# Patient Record
Sex: Female | Born: 1975 | Race: Black or African American | Hispanic: No | Marital: Single
Health system: Southern US, Community
[De-identification: ages and names within clinical notes are randomized; demographics above are authoritative.]

## PROBLEM LIST (undated history)

## (undated) DIAGNOSIS — F419 Anxiety disorder, unspecified: Secondary | ICD-10-CM

## (undated) DIAGNOSIS — T7840XA Allergy, unspecified, initial encounter: Secondary | ICD-10-CM

## (undated) DIAGNOSIS — D649 Anemia, unspecified: Secondary | ICD-10-CM

## (undated) DIAGNOSIS — F209 Schizophrenia, unspecified: Secondary | ICD-10-CM

## (undated) DIAGNOSIS — N200 Calculus of kidney: Secondary | ICD-10-CM

## (undated) DIAGNOSIS — I729 Aneurysm of unspecified site: Secondary | ICD-10-CM

## (undated) DIAGNOSIS — M329 Systemic lupus erythematosus, unspecified: Secondary | ICD-10-CM

## (undated) DIAGNOSIS — M1990 Unspecified osteoarthritis, unspecified site: Secondary | ICD-10-CM

## (undated) HISTORY — PX: TUBAL LIGATION: SHX77

## (undated) HISTORY — DX: Aneurysm of unspecified site: I72.9

## (undated) HISTORY — DX: Anemia, unspecified: D64.9

## (undated) HISTORY — PX: HERNIA REPAIR: SHX51

## (undated) HISTORY — DX: Allergy, unspecified, initial encounter: T78.40XA

## (undated) HISTORY — DX: Anxiety disorder, unspecified: F41.9

## (undated) HISTORY — DX: Systemic lupus erythematosus, unspecified: M32.9

---

## 1997-11-30 ENCOUNTER — Inpatient Hospital Stay (HOSPITAL_COMMUNITY): Admission: RE | Admit: 1997-11-30 | Discharge: 1997-11-30 | Payer: Self-pay | Admitting: *Deleted

## 1998-01-01 ENCOUNTER — Emergency Department (HOSPITAL_COMMUNITY): Admission: EM | Admit: 1998-01-01 | Discharge: 1998-01-01 | Payer: Self-pay | Admitting: Emergency Medicine

## 1998-01-09 ENCOUNTER — Encounter (HOSPITAL_BASED_OUTPATIENT_CLINIC_OR_DEPARTMENT_OTHER): Payer: Self-pay | Admitting: General Surgery

## 1998-01-10 ENCOUNTER — Ambulatory Visit (HOSPITAL_COMMUNITY): Admission: RE | Admit: 1998-01-10 | Discharge: 1998-01-10 | Payer: Self-pay | Admitting: General Surgery

## 1998-10-14 ENCOUNTER — Inpatient Hospital Stay (HOSPITAL_COMMUNITY): Admission: AD | Admit: 1998-10-14 | Discharge: 1998-10-14 | Payer: Self-pay | Admitting: *Deleted

## 1999-05-24 ENCOUNTER — Inpatient Hospital Stay (HOSPITAL_COMMUNITY): Admission: AD | Admit: 1999-05-24 | Discharge: 1999-05-24 | Payer: Self-pay | Admitting: *Deleted

## 1999-07-23 ENCOUNTER — Encounter: Payer: Self-pay | Admitting: *Deleted

## 1999-07-23 ENCOUNTER — Ambulatory Visit (HOSPITAL_COMMUNITY): Admission: RE | Admit: 1999-07-23 | Discharge: 1999-07-23 | Payer: Self-pay | Admitting: *Deleted

## 1999-09-08 ENCOUNTER — Inpatient Hospital Stay (HOSPITAL_COMMUNITY): Admission: AD | Admit: 1999-09-08 | Discharge: 1999-09-08 | Payer: Self-pay | Admitting: *Deleted

## 1999-09-23 ENCOUNTER — Inpatient Hospital Stay (HOSPITAL_COMMUNITY): Admission: AD | Admit: 1999-09-23 | Discharge: 1999-09-23 | Payer: Self-pay | Admitting: *Deleted

## 1999-10-03 ENCOUNTER — Inpatient Hospital Stay (HOSPITAL_COMMUNITY): Admission: AD | Admit: 1999-10-03 | Discharge: 1999-10-03 | Payer: Self-pay | Admitting: *Deleted

## 1999-10-07 ENCOUNTER — Inpatient Hospital Stay (HOSPITAL_COMMUNITY): Admission: AD | Admit: 1999-10-07 | Discharge: 1999-10-07 | Payer: Self-pay | Admitting: *Deleted

## 1999-10-10 ENCOUNTER — Inpatient Hospital Stay (HOSPITAL_COMMUNITY): Admission: AD | Admit: 1999-10-10 | Discharge: 1999-10-12 | Payer: Self-pay | Admitting: *Deleted

## 1999-10-12 ENCOUNTER — Encounter: Payer: Self-pay | Admitting: *Deleted

## 1999-10-17 ENCOUNTER — Inpatient Hospital Stay (HOSPITAL_COMMUNITY): Admission: AD | Admit: 1999-10-17 | Discharge: 1999-10-17 | Payer: Self-pay | Admitting: *Deleted

## 1999-10-27 ENCOUNTER — Inpatient Hospital Stay (HOSPITAL_COMMUNITY): Admission: AD | Admit: 1999-10-27 | Discharge: 1999-10-27 | Payer: Self-pay | Admitting: *Deleted

## 1999-11-07 ENCOUNTER — Inpatient Hospital Stay (HOSPITAL_COMMUNITY): Admission: AD | Admit: 1999-11-07 | Discharge: 1999-11-07 | Payer: Self-pay | Admitting: *Deleted

## 1999-11-19 ENCOUNTER — Inpatient Hospital Stay (HOSPITAL_COMMUNITY): Admission: AD | Admit: 1999-11-19 | Discharge: 1999-11-22 | Payer: Self-pay | Admitting: *Deleted

## 1999-11-19 ENCOUNTER — Encounter (INDEPENDENT_AMBULATORY_CARE_PROVIDER_SITE_OTHER): Payer: Self-pay

## 2001-01-05 ENCOUNTER — Emergency Department (HOSPITAL_COMMUNITY): Admission: EM | Admit: 2001-01-05 | Discharge: 2001-01-05 | Payer: Self-pay | Admitting: Emergency Medicine

## 2001-01-05 ENCOUNTER — Encounter: Payer: Self-pay | Admitting: Emergency Medicine

## 2001-05-07 ENCOUNTER — Emergency Department (HOSPITAL_COMMUNITY): Admission: EM | Admit: 2001-05-07 | Discharge: 2001-05-07 | Payer: Self-pay

## 2001-09-05 ENCOUNTER — Encounter: Admission: RE | Admit: 2001-09-05 | Discharge: 2001-09-05 | Payer: Self-pay | Admitting: Nephrology

## 2001-09-05 ENCOUNTER — Encounter: Payer: Self-pay | Admitting: Nephrology

## 2001-09-07 ENCOUNTER — Encounter (HOSPITAL_BASED_OUTPATIENT_CLINIC_OR_DEPARTMENT_OTHER): Payer: Self-pay | Admitting: General Surgery

## 2001-09-09 ENCOUNTER — Encounter (INDEPENDENT_AMBULATORY_CARE_PROVIDER_SITE_OTHER): Payer: Self-pay

## 2001-09-09 ENCOUNTER — Ambulatory Visit (HOSPITAL_COMMUNITY): Admission: RE | Admit: 2001-09-09 | Discharge: 2001-09-09 | Payer: Self-pay | Admitting: General Surgery

## 2002-06-12 ENCOUNTER — Emergency Department (HOSPITAL_COMMUNITY): Admission: EM | Admit: 2002-06-12 | Discharge: 2002-06-12 | Payer: Self-pay | Admitting: Emergency Medicine

## 2002-06-20 ENCOUNTER — Encounter: Payer: Self-pay | Admitting: Nephrology

## 2002-06-20 ENCOUNTER — Encounter: Admission: RE | Admit: 2002-06-20 | Discharge: 2002-06-20 | Payer: Self-pay | Admitting: Nephrology

## 2002-09-14 ENCOUNTER — Inpatient Hospital Stay (HOSPITAL_COMMUNITY): Admission: AD | Admit: 2002-09-14 | Discharge: 2002-09-14 | Payer: Self-pay | Admitting: Obstetrics and Gynecology

## 2002-10-26 ENCOUNTER — Emergency Department (HOSPITAL_COMMUNITY): Admission: EM | Admit: 2002-10-26 | Discharge: 2002-10-26 | Payer: Self-pay | Admitting: Emergency Medicine

## 2003-10-08 ENCOUNTER — Inpatient Hospital Stay (HOSPITAL_COMMUNITY): Admission: AD | Admit: 2003-10-08 | Discharge: 2003-10-08 | Payer: Self-pay | Admitting: Obstetrics and Gynecology

## 2004-07-28 ENCOUNTER — Emergency Department (HOSPITAL_COMMUNITY): Admission: EM | Admit: 2004-07-28 | Discharge: 2004-07-28 | Payer: Self-pay | Admitting: Emergency Medicine

## 2005-04-05 ENCOUNTER — Inpatient Hospital Stay (HOSPITAL_COMMUNITY): Admission: AD | Admit: 2005-04-05 | Discharge: 2005-04-05 | Payer: Self-pay | Admitting: Obstetrics and Gynecology

## 2005-07-30 ENCOUNTER — Emergency Department (HOSPITAL_COMMUNITY): Admission: EM | Admit: 2005-07-30 | Discharge: 2005-07-30 | Payer: Self-pay | Admitting: Family Medicine

## 2005-08-16 ENCOUNTER — Emergency Department (HOSPITAL_COMMUNITY): Admission: EM | Admit: 2005-08-16 | Discharge: 2005-08-16 | Payer: Self-pay | Admitting: Emergency Medicine

## 2005-10-03 ENCOUNTER — Emergency Department (HOSPITAL_COMMUNITY): Admission: EM | Admit: 2005-10-03 | Discharge: 2005-10-03 | Payer: Self-pay | Admitting: Emergency Medicine

## 2005-10-26 ENCOUNTER — Inpatient Hospital Stay (HOSPITAL_COMMUNITY): Admission: RE | Admit: 2005-10-26 | Discharge: 2005-10-28 | Payer: Self-pay | Admitting: Urology

## 2005-10-31 ENCOUNTER — Emergency Department (HOSPITAL_COMMUNITY): Admission: EM | Admit: 2005-10-31 | Discharge: 2005-10-31 | Payer: Self-pay | Admitting: Emergency Medicine

## 2005-11-01 ENCOUNTER — Inpatient Hospital Stay (HOSPITAL_COMMUNITY): Admission: EM | Admit: 2005-11-01 | Discharge: 2005-11-03 | Payer: Self-pay | Admitting: Emergency Medicine

## 2005-11-09 ENCOUNTER — Inpatient Hospital Stay (HOSPITAL_COMMUNITY): Admission: RE | Admit: 2005-11-09 | Discharge: 2005-11-11 | Payer: Self-pay | Admitting: Urology

## 2005-11-18 ENCOUNTER — Ambulatory Visit (HOSPITAL_COMMUNITY): Admission: RE | Admit: 2005-11-18 | Discharge: 2005-11-18 | Payer: Self-pay | Admitting: Urology

## 2006-04-01 ENCOUNTER — Ambulatory Visit (HOSPITAL_COMMUNITY): Admission: RE | Admit: 2006-04-01 | Discharge: 2006-04-01 | Payer: Self-pay | Admitting: Urology

## 2006-07-15 ENCOUNTER — Emergency Department (HOSPITAL_COMMUNITY): Admission: EM | Admit: 2006-07-15 | Discharge: 2006-07-15 | Payer: Self-pay | Admitting: Emergency Medicine

## 2009-12-10 ENCOUNTER — Inpatient Hospital Stay (HOSPITAL_COMMUNITY): Admission: AD | Admit: 2009-12-10 | Discharge: 2009-12-10 | Payer: Self-pay | Admitting: Obstetrics

## 2010-06-12 LAB — CBC
MCHC: 33.1 g/dL (ref 30.0–36.0)
MCV: 89.3 fL (ref 78.0–100.0)
Platelets: 159 10*3/uL (ref 150–400)
RDW: 14 % (ref 11.5–15.5)
WBC: 5.9 10*3/uL (ref 4.0–10.5)

## 2010-08-15 NOTE — Consult Note (Signed)
Dominique [name], Dominique [name] NO.:  [id number]   MEDICAL RECORD NO.:  [id number]          PATIENT TYPE:  EMS   LOCATION:  ED                           FACILITY:  [facility]   PHYSICIAN:  Valetta Fuller, M.D.  DATE OF BIRTH:  11-09-1975   DATE OF CONSULTATION:  10/31/2005  DATE OF DISCHARGE:                                   CONSULTATION   REASON FOR CONSULTATION:  I was called to the emergency room because of  right flank pain status post percutaneous nephrostomy tube placement and  right percutaneous nephrolithotomy on July 30 this year by Dr. Brunilda Payor.   HISTORY OF PRESENT ILLNESS:  Ms. Dominique [name] had presented with sudden onset of  right flank pain and was noted have a partial staghorn involving the right  kidney with mild hydronephrosis.  Her initial urine culture was positive for  E-coli which was treated, and then she had percutaneous nephrostomy tube  placed and underwent a percutaneous nephrolithotomy approximately six days  ago.  The stone was significantly debulked, but there is some residual upper  pole fragments.  The nephrostomy tube was catheterized at the time of  discharge, and a double-J stent was placed.  She has been afebrile but has  been having increasing pain, and therefore she presented to the emergency  room.  A CT scan was done which did show some questionable mild dilation of  the collecting system.  The nephrostomy tube appeared to be in good  position.  There was a residual stone in the upper pole and a stent that  appear to be in good position.  The patient tells me she has had quite a bit  of drainage around her nephrostomy tube.  She came in primarily because of  increased discomfort.   PAST HISTORY:  Is otherwise unremarkable.   MEDICATIONS:  She has been on some Vicodin and some Levaquin.   ALLERGIES:  She has no drug allergies.   PHYSICAL EXAMINATION:  GENERAL:  On exam, she is a well-developed, well-  nourished female.  She is in mild distress  but seems to be relatively  comfortable.  She does have some right CVA tenderness but no evidence of any  cellulitis.  VITAL SIGNS:  The patient is afebrile with a blood pressure 114/73 and pulse  of 115.   DATA:  White blood cell count is normal at 9.1 thousand, hemoglobin is 11.8,  creatinine 0.8.  Urine shows too numerous to count white cells, too numerous  to count rare cells.   ASSESSMENT:  Increasing right flank pain status post percutaneous  nephrolithotomy.  She is afebrile with normal white blood cell count.  There  is mild dilation of the collecting system on CT scan.  She is also having  quite a bit of leakage around nephrostomy tube suggesting that possibly the  double-J stent is not draining as well as it should.  For that reason, I  opened up her nephrostomy tube which had been capped, and we obtained a fair  amount of urine initially suggesting again poor drainage from the stent.  I  got an adapter system from interventional radiology and hooked her  nephrostomy tube up to drainage.  I think the majority of her pain is due to  irritation from the stent and nephrostomy tube as well as potentially some  of the hydronephrosis from poor drainage from the stent.  I think she  should feel substantially better with the nephrostomy tube open to gravity  drainage.  She is on antibiotics, and again she is nonfebrile.  She is going  to need follow-up lithotripsy to completely treat her stone.  She will need  to follow Dr. Brunilda Payor early next week.           ______________________________  Valetta Fuller, M.D.  Electronically Signed     DSG/MEDQ  D:  10/31/2005  T:  10/31/2005  Job:  [id number]   cc:   Lindaann Slough, M.D.  Fax: [phone]

## 2010-08-15 NOTE — Discharge Summary (Signed)
Dominique [name], Dominique [name]              ACCOUNT NO.:  [id number]   MEDICAL RECORD NO.:  [id number]          PATIENT TYPE:  INP   LOCATION:  1411                         FACILITY:  [facility]   PHYSICIAN:  Lindaann Slough, M.D.  DATE OF BIRTH:  Apr 19, 1975   DATE OF ADMISSION:  11/09/2005  DATE OF DISCHARGE:  11/11/2005                                 DISCHARGE SUMMARY   DISCHARGE DIAGNOSIS:  Right staghorn calculus.   PROCEDURE DONE:  Right percutaneous nephrolithotomy on November 09, 2005.   HISTORY:  The patient is a 35 year old female who was found to have a right  staghorn calculus.  She had a right percutaneous nephrolithotomy on October 16, 2005 and had remaining stone fragment.  She was readmitted on November 09, 2005 for completion of the nephrolithotomy.   PHYSICAL EXAMINATION:  VITAL SIGNS:  Blood pressure was 110/60, pulse 87,  respirations 18, temperature 98.4.  LUNGS:  Clear.  HEART:  Regular rhythm.  ABDOMEN:  Soft, nontender, bowel sounds normal.   LABORATORIES:  Hemoglobin on admission was 10.4, hematocrit 31.6, WBC 6.6,  sodium 135, potassium 4.3, BUN 5, creatinine 0.6.  Urine culture was  positive for Escherichia coli.   HOSPITAL COURSE:  The patient had right percutaneous nephrolithotomy on  November 09, 2005.  Postop course was uneventful.  She remained afebrile.  CT  scan on November 10, 2005 showed some small stone fragments in the renal  pelvis.  On November 11, 2005 she was afebrile, she was eating well, her  nephrostomy catheters were draining clear urine, she was voiding well.  The  right nephroureteral catheter was removed on November 10, 2005 and the  nephrostomy catheter of the upper pole calix was removed on November 11, 2005  and the lower nephrostomy catheter was left in placed and plugged.  She was  then discharged home on Keflex 500 mg every 6 hours and Demerol 50 mg every  6 hours as needed for pain.   CONDITION ON DISCHARGE:  Improved.   DISCHARGE DIET:   Regular.   PLAN:  The plan is to obtain a nephrostogram next week and if the  nephrostogram is normal the nephrostomy catheter will then be removed.   The patient is instructed not to do any lifting, straining, or driving until  further advised.      Lindaann Slough, M.D.  Electronically Signed     MN/MEDQ  D:  11/11/2005  T:  11/11/2005  Job:  [id number]   cc:   Jarome Matin, M.D.  Fax: [phone]

## 2010-08-15 NOTE — Discharge Summary (Signed)
Dominique [name], Dominique [name]              ACCOUNT NO.:  [id number]   MEDICAL RECORD NO.:  [id number]          PATIENT TYPE:  INP   LOCATION:  1421                         FACILITY:  [facility]   PHYSICIAN:  Lindaann Slough, M.D.  DATE OF BIRTH:  08-Feb-1976   DATE OF ADMISSION:  11/01/2005  DATE OF DISCHARGE:  11/03/2005                                 DISCHARGE SUMMARY   DISCHARGE DIAGNOSIS:  Right renal stones status post right percutaneous  nephrolithotomy and flank pain.   SUMMARY:  The patient is a 35 year old female status post right percutaneous  nephrolithotomy in July. She was discharged with a nephrostomy catheter and  on Demerol and Keflex. She was complaining of right flank pain that was not  relieved by the medication. The nephrostomy catheter was opened to drainage  bag. However, she continued to complain of pain and returned to the  emergency room. She is known to have a remaining stone fragment in the right  kidney, and she is scheduled for a second procedure in one week. However,  she continued to have pain, and she was then admitted for further treatment.  Then, the patient was taken Cartia and lisinopril given to her by the  pharmacy instead of Keflex and Demerol. A cardiology consultation was  requested with Dr. Sharyn Lull. He felt that the patient was stable and did not  have any side effects from the medication. She was treated with IV  analgesics. Creatine kinase was 56, CK-MB was 0.6, and troponin was 0.02,  all within normal limits. EKG showed nonspecific ST abnormality. She was  afebrile and felt better, did not have as much pain, and she was then  discharged home on November 03, 2005 on Demerol 50 mg every 6 hours and Keflex  500 mg every 6 hours, and she will return next week for second  nephrolithotomy.   CONDITION ON DISCHARGE:  Improved.   DISCHARGE DIET:  Regular.   ACTIVITY:  She is instructed not to do any lifting, straining or driving.      Lindaann Slough,  M.D.  Electronically Signed     MN/MEDQ  D:  11/20/2005  T:  11/20/2005  Job:  [id number]

## 2010-08-15 NOTE — Discharge Summary (Signed)
[facility]  Patient:    Dominique [name], Dominique [name]                       MRN: [id number] Adm. Date:  [id number] Disc. Date: [id number] Attending:  Deniece Ree                           Discharge Summary  DISCHARGE DIAGNOSES:          1. Intrauterine pregnancy at term.                               2. Multiparity desiring sterilization.  PROCEDURES:                   1. Repeat cesarean section.                               2. Bilateral tubal ligation.  ADMISSION HISTORY:            The patient is a 35 year old gravida 3, para 2, who had previous deliveries by way of cesarean section.  HOSPITAL COURSE:              The patient was admitted on August 22 for a repeat at which time she underwent a repeat cesarean section and a bilateral tubal ligation. The patient tolerated the procedure very well without any problems. Postoperatively, she did very well without complications and was discharged on he third postoperative day.  DISCHARGE INSTRUCTIONS:       She was instructed on the possible complications nd care following this type of surgery.  FOLLOWUP:                     She was told to return to my office in four weeks for follow-up evaluation or to call me prior to that time should a problem arise. DD:  01/07/00 TD:  01/09/00 Job: [id number]

## 2010-08-15 NOTE — Discharge Summary (Signed)
[name], Dominique [name]              ACCOUNT NO.:  [id number]   MEDICAL RECORD NO.:  [id number]          PATIENT TYPE:  INP   LOCATION:  1432                         FACILITY:  [facility]   PHYSICIAN:  Lindaann Slough, M.D.  DATE OF BIRTH:  1976/01/21   DATE OF ADMISSION:  10/26/2005  DATE OF DISCHARGE:                                 DISCHARGE SUMMARY   DISCHARGE DIAGNOSES:  1. Right staghorn calculus.  2. Urinary tract infection.  3. Hydronephrosis.   PROCEDURE DONE:  Right percutaneous nephrostomy and right percutaneous  nephrolithotomy on October 26, 2005.   The patient is a 35 year old female who was admitted on October 26, 2005, for  management of a right staghorn calculus.  She has a history of sudden onset  of severe right flank pain about a month ago that was associated with nausea  and vomiting.  A CT scan of the abdomen and pelvis showed a right staghorn  calculus with mild hydronephrosis.  Urine culture was also positive for E.  coli.  She was admitted on October 26, 2005, for nephrolithotomy.   Blood pressure on admission was 95/55, pulse 78, respirations 14,  temperature 97.5.  Lungs clear, heart regular rhythm.  Abdomen soft, tender  in the right flank and the right lower quadrant, bowel sounds normal.   Hemoglobin on admission was 11.2, hematocrit 34.3, and wbc's 7.6.  Sodium  133, potassium 3.5, BUN 8, creatinine 0.6.  Urine culture showed multiple  species.  She was treated as an outpatient with Cipro for E. coli UTI.   On October 26, 2005, she had a right percutaneous nephrolithotomy.  However,  she has residual stone in the renal pelvis.   Postoperative course was uneventful.  She remained afebrile.  She tolerated  her diet well.  On October 28, 2005, she was afebrile, she was eating well,  she was voiding well.  I removed the right nephrostomy catheter and left the  nephroureteral catheter in place.  I had discussed with Dr. Deanne Coffer  regarding management of the remaining stone  in the kidney.  The plan is to  bring her back in 2 weeks for right percutaneous nephrostomy with access to  the upper pole.  This has been discussed in detail with the patient and her  mother.  They both understand and are agreeable.  She will be discharged  home on Keflex 500 mg every 6 hours and Demerol 50 mg every 6 hours as  needed for pain.   CONDITION ON DISCHARGE:  Improved.   DISCHARGE DIET:  Regular.  INSTRUCTIONS:  No lifting or straining.  The patient will return on November 09, 2005, for completion of the  nephrolithotomy.      Lindaann Slough, M.D.  Electronically Signed     MN/MEDQ  D:  10/28/2005  T:  10/28/2005  Job:  [id number]   cc:   Jarome Matin, M.D.  Fax: [phone]

## 2010-08-15 NOTE — H&P (Signed)
NAME:  RHIANNA, RAULERSON NO.:  [id number]   MEDICAL RECORD NO.:  [id number]           PATIENT TYPE:   LOCATION:                                 FACILITY:   PHYSICIAN:  Lindaann Slough, M.D.       DATE OF BIRTH:   DATE OF ADMISSION:  11/09/2005  DATE OF DISCHARGE:                                HISTORY & PHYSICAL   CHIEF COMPLAINT:  Right kidney stone.   HISTORY OF PRESENT ILLNESS:  Patient is a 35 year old female who had right  percutaneous nephrolithotomy on October 26, 2005 for right staghorn calculus.  She has a remaining stone and she returns now for admission to complete the  nephrolithotomy.   PAST MEDICAL HISTORY:  Negative.   ALLERGIES:  No known drug allergies.   MEDICATIONS:  Demerol and Keflex.   PAST SURGICAL HISTORY:  1. She had cesarean section in October 1994, November 1997, and August      2001.  2. Umbilical hernia repair in 1986 and ventral hernia repair in 1999.   SOCIAL HISTORY:  She is single, has three children.  Has smoked half a pack  a day for 10 years and does not drink.   FAMILY HISTORY:  Positive for diabetes, hypertension, kidney stone.   REVIEW OF SYSTEMS:  She complains of headache, weight loss, fatigue, right  flank pain, nausea, shortness of breath, cough, frequency and urgency and  everything else is negative.   PHYSICAL EXAMINATION:  GENERAL:  This is a well-developed 35 year old female  in no acute distress.  VITAL SIGNS:  Blood pressure is 110/60, pulse 87, respirations 18,  temperature 98.4.  HEENT:  Head is normal.  Pupils are equal and reactive to light and  accommodation.  Ears, nose, and throat are within normal limits.  NECK:  Supple.  No cervical lymph nodes.  No thyromegaly.  CHEST:  Symmetrical.  Lungs fully expanded and clear to percussion and  auscultation.  HEART:  Regular rate.  ABDOMEN:  Soft.  Tender in the right flank.  She has no hepatomegaly, no  splenomegaly.  Bladder is not  distended.  She has  no inguinal hernia.  Meatus is normal.  There is no  caruncle , no cystocele.  PELVIC:  Not done.   IMPRESSION:  Right staghorn calculus.      Lindaann Slough, M.D.  Electronically Signed     MN/MEDQ  D:  11/09/2005  T:  11/09/2005  Job:  [id number]

## 2010-08-15 NOTE — H&P (Signed)
[name], [name]              ACCOUNT NO.:  [id number]   MEDICAL RECORD NO.:  [id number]          PATIENT TYPE:  INP   LOCATION:  0005                         FACILITY:  [facility]   PHYSICIAN:  Lindaann Slough, M.D.  DATE OF BIRTH:  Dec 14, 1975   DATE OF ADMISSION:  10/26/2005  DATE OF DISCHARGE:                                HISTORY & PHYSICAL   CHIEF COMPLAINT:  Right flank pain and kidney stone.   HISTORY OF PRESENT ILLNESS:  Patient is a 35 year old female who had sudden  onset of severe right flank pain about three weeks ago associated with  nausea and vomiting.  The pain was colicky in nature and nonradiating.  She  was seen in the emergency room and a CT scan of the abdomen and pelvis  showed a staghorn calculus in the right kidney with mild hydronephrosis.  She was seen in the office on October 06, 2005, and treatment options were  discussed with her: ureteroscopy with Holmium laser, ESL, percutaneous  nephrostomy.  She is now admitted for percutaneous nephrolithotomy.   MEDICATIONS:  Cipro and Vicodin.   ALLERGIES:  NO KNOWN DRUG ALLERGIES.   PAST MEDICAL HISTORY:  Negative.   PAST SURGICAL HISTORY:  1.  C. section in October 1994, November 1997 and August 2001.  2.  Umbilical hernia repair in 1986.  3.  Ventral hernia repair in November 1999.   SOCIAL HISTORY:  She is single, has three children, has smoked half a pack a  day for 10 years, does not drink.   FAMILY HISTORY:  Positive for diabetes, hypertension and her maternal  grandmother had history of kidney stone and she has one brother.   REVIEW OF SYSTEMS:  She complains of headaches, dizziness, weight loss,  fatigue, abdominal pain, nausea, shortness of breath, cough, frequency,  urgency, stress incontinence.  All others are negative.   PHYSICAL EXAMINATION:  GENERAL APPEARANCE:  She is a well-built 35 year old  female in no acute distress at this time.  She is oriented to time, place  and person.  VITAL  SIGNS:  Blood pressure is 95/55, pulse 78, respirations 14,  temperature 97.5.  HEENT:  Her head is normal.  Pupils are equal,  reactive to light and  accommodation.  Ears, nose and throat are within normal limits.  She has  pink conjunctivae.  NECK:  Supple, no cervical lymph nodes, no thyromegaly.  CHEST:  Symmetrical.  Lungs fully expanded and clear to auscultation and  percussion.  CARDIOVASCULAR:  Regular rate.  ABDOMEN:  Soft.  She has mild tenderness to the right flank and the right  lower quadrant.  She has no hepatomegaly, no splenomegaly.  Bladder is not  distended.  She has well healed umbilical and epigastric scars.  She has no  inguinal hernia.  Meatus is normal.  She has no caruncle, no cystocele.  PELVIC:  There is no tenderness in the bladder area.  There is no adnexal  mass.  Cervix is firm and midline, nontender.   IMPRESSION:  Right staghorn calculus, hydronephrosis and urinary tract  infection.      Marc-Henry  Nesi, M.D.  Electronically Signed     MN/MEDQ  D:  10/26/2005  T:  10/26/2005  Job:  [id number]

## 2010-08-15 NOTE — H&P (Signed)
[facility]  Patient:    Dominique [name], Dominique [name]                       MRN: [id number] Adm. Date:  [id number] Attending:  Deniece Ree                         History and Physical  HISTORY:                      The patient is a 35 year old gravida 3, para 2, whose previous deliveries were by way of cesarean section.  The patients estimated date of confinement by dates is November 27, 1999, by ultrasound November 30, 1999.  The patient has had chronic uterine pain for the last six weeks.  She is being admitted at her request for a repeat cesarean section. She is also desirous of a permanent sterilization.  The procedure of sterilization has been explained to this patient, who understands, and all of her questions have been answered to her satisfaction.  She understands that the procedure is intended to be permanent; however, cannot be guaranteed.  PAST MEDICAL HISTORY/FAMILY HISTORY/REVIEW OF SYSTEMS:  Noncontributory except as stated in the present illness.  PHYSICAL EXAMINATION:  GENERAL:                      A well-developed, well-nourished gravid female, in no acute distress.  HEENT:                        Within normal limits.  NECK:                         Supple.  BREASTS:                      Without masses, tenderness, or discharge.  LUNGS:                        Clear to percussion and auscultation.  HEART:                        Normal sinus rhythm without murmur, rub, or gallop.  ABDOMEN:                      Approximately [redacted] weeks gestation, with good fetal heart tones in the left lower quadrant.  EXTREMITIES:                  Within normal limits.  NEUROLOGIC:                   Within normal limits.  PELVIC:                       Not done.  DIAGNOSES:                    1. Intrauterine pregnancy at approximately                                  [redacted] weeks gestation.                               2.  Previous cesarean section x 2.                             3. Multiparity, desires permanent sterilization.  PLAN:                         For a repeat cesarean section and a bilateral tubal ligation. DD:  11/19/99 TD:  11/19/99 Job: [id number]

## 2010-08-15 NOTE — Discharge Summary (Signed)
[place]  Patient:    Dominique [name], Dominique [name] Visit Number: [id number] MRN: [id number]          Service Type: DSU Location: [facility] 2899 25 Attending Physician:  Sonda Primes Dictated by:   Mardene Celeste Lurene Shadow, M.D. Admit Date:  09/09/2001 Discharge Date: 09/09/2001   CC:         Luisa Hart L. Lurene Shadow, M.D. 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  POSTOPERATIVE DIAGNOSIS:  Fibroadenoma.  PROCEDURE:  Excisional biopsy of right breast mass.  SURGEON:  Mardene Celeste. Lurene Shadow, M.D.  ASSISTANT:  Nurse.  ANESTHESIA:  General anesthesia.  INDICATIONS:  The patient is a 35 year old young woman presenting with a right breast mass which she thinks is extremely painful and enlarging in size.  She is brought to the operating room after the risks and benefits of breast surgery have been discussed with her and she gives consent.  DESCRIPTION OF PROCEDURE:  Following the induction of satisfactory anesthesia, she is positioned supinely and the right breast is prepped and draped routinely.  I made a circumareolar incision on the medial aspect of the breast carrying the dissection down to the skin and subcutaneous tissues and through the breast tissue to the mass.  The mass was excised in its entirety, removed, and forwarded for pathologic evaluation.  Hemostasis was obtained with electrocautery.  Breast tissues were reapproximated with 3-0 Vicryl after sponge, instrument, and sharp counts were verified.  The skin was reapproximated with a running subcuticular stitch of 5-0 Monocryl.  The wound was then reinforced with Steri-Strips and a sterile dressing applied. The anesthetic was reversed.  The patient removed from the operating room to the recovery room in stable condition.  She tolerated the procedure well. Dictated by:   Mardene Celeste. Lurene Shadow, M.D. Attending Physician:  Sonda Primes DD:  09/09/01 TD:  09/11/01 Job: [id number]

## 2010-08-15 NOTE — Op Note (Signed)
Dominique [name], Dominique [name]              ACCOUNT NO.:  [id number]   MEDICAL RECORD NO.:  [id number]          PATIENT TYPE:  INP   LOCATION:  1411                         FACILITY:  [facility]   PHYSICIAN:  Lindaann Slough, M.D.  DATE OF BIRTH:  09/02/1975   DATE OF PROCEDURE:  11/09/2005  DATE OF DISCHARGE:                                 OPERATIVE REPORT   PREOPERATIVE DIAGNOSIS:  Right staghorn calculus.   POSTOPERATIVE DIAGNOSIS:  Right staghorn calculus.   PROCEDURE DONE:  Right percutaneous nephrolithotomy.   SURGEONS:  Dr. Brunilda Payor and Dr. Deanne Coffer.   ANESTHESIA:  General.   INDICATIONS:  Patient is a 35 year old female who had right percutaneous  nephrolithotomy on October 26, 2005.  There was a remaining stone fragment in  the kidney.  She is readmitted now for completion of the percutaneous  nephrolithotomy.   A different access through an upper pole calix was done this morning by Dr.  Deanne Coffer.  The nephrostomy tract was then dilated by Dr. Deanne Coffer.  Then the  nephroscope was passed through the nephrostomy tract and the stone was  visualized.  The stone was then fragmented in multiple stone fragments with  the lithoclast.  Another stone fragment was in the previous nephrostomy  tract.  That nephrostomy tract was then dilated by Dr. Deanne Coffer and the  nephroscope was passed through that tract.  The stone fragment was also  fragmented and removed.  There was no evidence of remaining stone fragment  in the kidney.  Then a #22-French Councill tip catheter was passed through  each nephrostomy tract, and ureteral catheter was passed through the upper  pole calix.  The Councill catheters were then secured to the skin with #2-0  silk.   ESTIMATED BLOOD LOSS:  Minimal.   BLOOD REPLACEMENT:  None.   Patient tolerated the procedure well and left the OR in satisfactory  condition to Postanesthesia Care Unit.      Lindaann Slough, M.D.  Electronically Signed    MN/MEDQ  D:  11/09/2005  T:   11/10/2005  Job:  [id number]   cc:   Jarome Matin, M.D.  Fax: [phone]

## 2010-08-15 NOTE — Op Note (Signed)
[facility]  Patient:    Dominique [name], Dominique [name]                       MRN: [id number] Proc. Date: 11/19/99 Adm. Date:  [id number] Attending:  Deniece Ree                           Operative Report  PREOPERATIVE DIAGNOSIS:       Intrauterine pregnancy at [redacted] weeks gestation; previous cesarean sections x 2; multiparity, desires permanent sterilization.  POSTOPERATIVE DIAGNOSIS:      Intrauterine pregnancy at [redacted] weeks gestation; previous cesarean sections x 2; multiparity, desire permanent sterilization; viable female infant with an Apgar of 9 and 9.  OPERATION:                    Repeat cesarean section; bilateral tubal ligation.  SURGEON:                      Deniece Ree, M.D.  ASSISTANT:                    Kathreen Cosier, M.D.  ANESTHESIA:                   Dr. Jean Rosenthal, epidural.  PEDIATRICS:                   Teaching service.  ESTIMATED BLOOD LOSS:         500 cc.  DRAINS:                       A Foley left to straight drainage.  DISPOSITION:                  The patient tolerated the procedure well and returned to the recovery room in satisfactory condition.  DESCRIPTION OF PROCEDURE:     The patient was taken to the operating room and prepped and draped in the usual fashion for a repeat cesarean section.  A low Pfannenstiel incision was made following the course of the previous incision. This was carried down through the fascia, at which time the fascia was incised to the extent of the incision.  The midline identified and rectus muscles separate.  The abdominal peritoneum was then entered in a vertical fashion using Metzenbaum scissors.  The visceral peritoneum was then incised bilaterally toward the round ligaments, following which the lower uterine segment was scored in the midline, and bluntly dissected open.  The right hand was introduced, and a viable infants head was delivered.  The baby was first suctioned with the suction  bulb, the cord, which was around the neck, was removed.  Complete delivery was then carried out without difficulty.  The cord was clamped and the infant turned over to the pediatricians who were in attendance.  Cord blood was obtained, following which the placenta was as well as all _____ was then removed from the uterine cavity.  IV Pitocin as well as IV _____ were then begun.  The myometrium was then closed with #1 chromic in a running locking stitch, followed by an imbricating stitch including, the peritoneum was then closed #1 chromic in a running stitch.  At this point, the left tube was then grasped, identified, and followed out until the fimbriated end could be identified.  It was then knuckled up and utilizing 0 plain catgut, ligated  in a modified Pomeroy procedure.  This was done likewise on the opposite side.  These segments of tube were then cut, labeled, and sent to pathology.  Hemostasis was present.  Sponge and needle count was correct x 2. The abdominal peritoneum was then closed with 2-0 chromic in a running stitch, followed by closure of the fascia using #1 Dexon in a running stitch.  Skin was closed with 4-0 Monocryl in a subcuticular stitch.  Procedure terminated. The patient tolerated the procedure well and returned to the recovery room in satisfactory condition. DD:  11/19/99 TD:  11/19/99 Job: [id number]

## 2010-08-15 NOTE — Op Note (Signed)
Dominique [name], Dominique [name]              ACCOUNT NO.:  [id number]   MEDICAL RECORD NO.:  [id number]          PATIENT TYPE:  INP   LOCATION:  1432                         FACILITY:  [facility]   PHYSICIAN:  Lindaann Slough, M.D.  DATE OF BIRTH:  07-13-1975   DATE OF PROCEDURE:  10/26/2005  DATE OF DISCHARGE:                                 OPERATIVE REPORT   PREOPERATIVE DIAGNOSIS:  Right staghorn calculus.   POSTOPERATIVE DIAGNOSIS:  Right staghorn calculus.   PROCEDURE:  Right percutaneous nephrolithotomy.   SURGEON:  Dr. Brunilda Payor. Dr. Bonnielee Haff.   ANESTHESIA:  General.   INDICATION:  The patient is a 35 years old female who had sudden onset of  severe right flank pain about 3 weeks ago.  She was seen in the emergency  room and a CT scan of the abdomen and pelvis showed a right staghorn  calculus.  Treatment options were discussed with the patient :ureteroscopy,  holmium laser, and PCNL.  She agrees to have a PCNL since that gives her a  better chance of removing all the stone fragments.  She had percutaneous  nephrostomy done this morning.  She is now scheduled for PCNL.   Under general anesthesia the patient was prepped and draped and placed in  the prone position.  The nephrostomy tract was then dilated by Dr. Bonnielee Haff. The  nephroscope was then inserted in the kidney through Amplatz sheath.  The  stone was visualized through the nephrostomy tract.  Then the Lithoclast was  used to fragment the stone; however, the Lithoclast was not functioning  properly and the stone could not be fragmented.  Then ultrasonic  lithotripter was used and the lower segment of the stone was fragmented and  those fragments were removed.  However, it was difficult to get access to  the upper segment of the stone that was difficult to visualize because of  access through the nephrostomy tract.  Several attempts were made to get  access to that upper fragment but were unsuccessful.  At that point it was  then decided to  end the procedure.  A nephroureteral catheter was then  passed over the safety wire into the bladder.  The safety wire was then  removed.  A #22-French Councill tip catheter was then passed over the  working wire into the renal pelvis.  The balloon of the Councill catheter  was then inflated with 5 mL of normal saline.  Contrast was then injected  through the Councill tip catheter and the catheter is in good position in  the renal pelvis.  The working wire was then removed.  Amplatz sheath was  then removed.  The catheter was secured to the skin with #2-0 silk.  The  catheter was then placed to straight drainage.   The patient tolerated the procedure well and left the OR in satisfactory  condition to post anesthesia care unit.   The plan is to bring the patient back at a later date for PCNL of the  remaining stone fragments.      Lindaann Slough, M.D.  Electronically Signed     MN/MEDQ  D:  10/26/2005  T:  10/27/2005  Job:  [id number]   cc:   Jarome Matin, M.D.  Fax: [phone]

## 2011-12-02 ENCOUNTER — Inpatient Hospital Stay (HOSPITAL_COMMUNITY)
Admission: AD | Admit: 2011-12-02 | Discharge: 2011-12-03 | Disposition: A | Discharge status: Home / Self Care | Payer: Medicaid Other | Source: Ambulatory Visit | Attending: Obstetrics & Gynecology | Admitting: Obstetrics & Gynecology

## 2011-12-02 ENCOUNTER — Encounter (HOSPITAL_COMMUNITY): Payer: Self-pay | Admitting: *Deleted

## 2011-12-02 DIAGNOSIS — N926 Irregular menstruation, unspecified: Secondary | ICD-10-CM | POA: Insufficient documentation

## 2011-12-02 DIAGNOSIS — N12 Tubulo-interstitial nephritis, not specified as acute or chronic: Secondary | ICD-10-CM

## 2011-12-02 DIAGNOSIS — N200 Calculus of kidney: Secondary | ICD-10-CM | POA: Insufficient documentation

## 2011-12-02 DIAGNOSIS — R109 Unspecified abdominal pain: Secondary | ICD-10-CM | POA: Insufficient documentation

## 2011-12-02 DIAGNOSIS — R300 Dysuria: Secondary | ICD-10-CM | POA: Insufficient documentation

## 2011-12-02 HISTORY — DX: Calculus of kidney: N20.0

## 2011-12-02 LAB — CBC WITH DIFFERENTIAL/PLATELET
Eosinophils Absolute: 0.1 10*3/uL (ref 0.0–0.7)
Eosinophils Relative: 1 % (ref 0–5)
HCT: 31.4 % — ABNORMAL LOW (ref 36.0–46.0)
Hemoglobin: 10.5 g/dL — ABNORMAL LOW (ref 12.0–15.0)
Lymphocytes Relative: 49 % — ABNORMAL HIGH (ref 12–46)
Lymphs Abs: 3.6 10*3/uL (ref 0.7–4.0)
MCH: 29 pg (ref 26.0–34.0)
MCV: 86.7 fL (ref 78.0–100.0)
Monocytes Relative: 6 % (ref 3–12)
Platelets: 175 10*3/uL (ref 150–400)
RBC: 3.62 MIL/uL — ABNORMAL LOW (ref 3.87–5.11)
WBC: 7.3 10*3/uL (ref 4.0–10.5)

## 2011-12-02 LAB — URINALYSIS, ROUTINE W REFLEX MICROSCOPIC
Bilirubin Urine: NEGATIVE
Glucose, UA: NEGATIVE mg/dL
Ketones, ur: NEGATIVE mg/dL
Protein, ur: NEGATIVE mg/dL
pH: 6.5 (ref 5.0–8.0)

## 2011-12-02 LAB — URINE MICROSCOPIC-ADD ON

## 2011-12-02 MED ORDER — HYDROMORPHONE HCL PF 1 MG/ML IJ SOLN
1.0000 mg | Freq: Once | INTRAMUSCULAR | Status: AC
  Administered 2011-12-03: 1 mg via INTRAVENOUS
  Filled 2011-12-02: qty 1

## 2011-12-02 MED ORDER — OXYCODONE-ACETAMINOPHEN 5-325 MG PO TABS
2.0000 | ORAL_TABLET | Freq: Once | ORAL | Status: AC
  Administered 2011-12-02: 2 via ORAL
  Filled 2011-12-02: qty 2

## 2011-12-02 MED ORDER — CEFTRIAXONE SODIUM 1 G IJ SOLR
1.0000 g | Freq: Once | INTRAMUSCULAR | Status: AC
  Administered 2011-12-02: 1 g via INTRAMUSCULAR
  Filled 2011-12-02: qty 10

## 2011-12-02 MED ORDER — KETOROLAC TROMETHAMINE 30 MG/ML IJ SOLN
30.0000 mg | Freq: Once | INTRAMUSCULAR | Status: AC
  Administered 2011-12-03: 30 mg via INTRAVENOUS
  Filled 2011-12-02: qty 1

## 2011-12-02 NOTE — ED Notes (Signed)
[id number]<GL> Expected date:<BR> Expected time:<BR> Means of arrival:<BR> Comments:<BR> Transfer from [facility]

## 2011-12-02 NOTE — Progress Notes (Signed)
Pt informed that lab results are pending and that the NP would be in to speak with her shortly

## 2011-12-02 NOTE — MAU Note (Signed)
Pt states constant right flank pain, more painful with urination. Just came off menstrual cycle 11/22/2011. States issues with irregular cycles as well. Denies abnormal vaginal discharge. +CVA tenderness on R side. Hx kidney stones x2. Feels similar.

## 2011-12-02 NOTE — ED Provider Notes (Signed)
History     CSN: [id number]  Arrival date & time 12/02/11  4098   First MD Initiated Contact with Patient 12/02/11 2356      Chief Complaint  Patient presents with  . Abdominal Pain    (Consider location/radiation/quality/duration/timing/severity/associated sxs/prior treatment) The history is provided by the patient and medical records.   patient reports several days of worsening suprapubic pain now with radiation up into her right flank.  She does have a history of ureteral stones.  She was seen at [facility] hospital and was found to have a urinary tract infection likely pyelonephritis.  I instructed them to give her a dose of intramuscular Rocephin.  They asked to transfer the patient to the emergency department for concern for possible obstructing stone given the hematuria noted.  The patient has had no fevers or chills.  She does report nausea without vomiting.  She denies diarrhea.  Her pain is moderate to severe at this time.  She's given Percocet at [facility] without improvement in her symptoms  Past Medical History  Diagnosis Date  . Kidney stones     Past Surgical History  Procedure Date  . Cesarean section   . Hernia repair     Family History  Problem Relation Age of Onset  . Hypertension Father   . Diabetes Father     History  Substance Use Topics  . Smoking status: Current Some Day Smoker -- 0.5 packs/day  . Smokeless tobacco: Not on file  . Alcohol Use: 1.0 oz/week    2 drink(s) per week     occasional    OB History    Grav Para Term Preterm Abortions TAB SAB Ect Mult Living   3 3 3  0 0 0 0 0 0 3      Review of Systems  All other systems reviewed and are negative.    Allergies  Review of patient's allergies indicates no known allergies.  Home Medications   Current Outpatient Rx  Name Route Sig Dispense Refill  . ACETAMINOPHEN 500 MG PO TABS Oral Take 500 mg by mouth every 6 (six) hours as needed. Stomach cramps    . IBUPROFEN 200 MG PO  TABS Oral Take 200 mg by mouth every 6 (six) hours as needed. Stomach cramps      BP 107/53  Pulse 52  Temp 97.9 F (36.6 C) (Oral)  Resp 18  Ht 5' (1.524 m)  Wt 116 lb (52.617 kg)  BMI 22.65 kg/m2  SpO2 99%  LMP 11/22/2011  Physical Exam  Nursing note and vitals reviewed. Constitutional: She is oriented to person, place, and time. She appears well-developed and well-nourished. No distress.  HENT:  Head: Normocephalic and atraumatic.  Eyes: EOM are normal.  Neck: Normal range of motion.  Cardiovascular: Normal rate, regular rhythm and normal heart sounds.   Pulmonary/Chest: Effort normal and breath sounds normal.  Abdominal: Soft. She exhibits no distension. There is no tenderness.       Mild right sided abdominal tenderness without guarding or rebound  Genitourinary:       Right CVA tenderness  Musculoskeletal: Normal range of motion.  Neurological: She is alert and oriented to person, place, and time.  Skin: Skin is warm and dry.  Psychiatric: She has a normal mood and affect. Judgment normal.    ED Course  Procedures (including critical care time)  Labs Reviewed  URINALYSIS, ROUTINE W REFLEX MICROSCOPIC - Abnormal; Notable for the following:    APPearance CLOUDY (*)  Hgb urine dipstick MODERATE (*)     Nitrite POSITIVE (*)     Leukocytes, UA LARGE (*)     All other components within normal limits  URINE MICROSCOPIC-ADD ON - Abnormal; Notable for the following:    Squamous Epithelial / LPF FEW (*)     Bacteria, UA MANY (*)     All other components within normal limits  CBC WITH DIFFERENTIAL - Abnormal; Notable for the following:    RBC 3.62 (*)     Hemoglobin 10.5 (*)     HCT 31.4 (*)     Lymphocytes Relative 49 (*)     All other components within normal limits  POCT PREGNANCY, URINE   Ct Abdomen Pelvis Wo Contrast  12/03/2011  *RADIOLOGY REPORT*  Clinical Data: Right flank pain.  History of kidney stones and right nephrostomy.  CT ABDOMEN AND PELVIS  WITHOUT CONTRAST  Technique:  Multidetector CT imaging of the abdomen and pelvis was performed following the standard protocol without intravenous contrast.  Comparison: Abdominal pelvic CT 02/23/2006.  Findings: The lung bases are clear.  There is no pleural effusion. There is scarring in the lower pole of the right kidney with minimal parenchymal calcification.  No urinary tract calculus is seen.  There is no hydronephrosis or perinephric soft tissue stranding.  There is no evidence of ureteral or bladder calculus.  As evaluated in the noncontrast state, the liver, gallbladder, pancreas, spleen and adrenal glands appear normal.  The bowel gas pattern is normal.  No inflammatory changes are seen. The appendix appears normal.  Mildly prominent uterus appears stable without focal abnormality. There is no evidence of adnexal mass or pelvic inflammatory process.  IMPRESSION:  1.  Cortical scarring in the lower pole of the right kidney with minimal parenchymal calcification. 2.  No residual/recurrent urinary tract calculus.  No hydronephrosis. 3.  No acute inflammatory changes.   Original Report Authenticated By: Gerrianne Scale, M.D.     I personally reviewed the imaging tests through PACS system  I reviewed available ER/hospitalization records thought the EMR   1. Renal calculi   2. Pyelonephritis       MDM  CT to evaluate for complicated infection, ie with ureteral stone. IM rocephin already. IV fluids and pain meds now  12:53 AM The patient feels much better at this time.  She'll be discharged home.  No evidence of ureteral stone.  Discharge home with pain medicine, and nausea medicine, antibiotics.  Lyanne Co, MD 12/03/11 [id number]

## 2011-12-02 NOTE — MAU Provider Note (Signed)
Chief Complaint: R sided pain after urination   First Provider Initiated Contact with Patient 12/02/11 2006      SUBJECTIVE HPI: Dominique [name] is a 36 y.o. [phone] non-pregnant female who presents to MAU reporting colicky right flank pain, 10/10 on pain scale w/ gradual onset two weeks ago and dysuria x2 days. Denies fever, chills, nausea, vomiting, diarrhea, constipation, vaginal bleeding or discharge, hematuria or frequency. Hx kidney stones x2 treated with lithotripsy, surgery, stents. Feels similar. Patient of Dr. Brunilda Payor.   Pain is worse with movement. Not relieved by Tylenol p.m. Or increasing water intake.  Past Medical History  Diagnosis Date  . No pertinent past medical history    OB History    Grav Para Term Preterm Abortions TAB SAB Ect Mult Living   3 3 3  0 0 0 0 0 0 3     # Outc Date GA Lbr Len/2nd Wgt Sex Del Anes PTL Lv   1 TRM            2 TRM            3 TRM              Past Surgical History  Procedure Date  . Cesarean section   . Hernia repair    History   Social History  . Marital Status: Single    Spouse Name: N/A    Number of Children: N/A  . Years of Education: N/A   Occupational History  . Not on file.   Social History Main Topics  . Smoking status: Current Some Day Smoker -- 0.5 packs/day  . Smokeless tobacco: Not on file  . Alcohol Use: 1.0 oz/week    2 drink(s) per week  . Drug Use: No  . Sexually Active: No   Other Topics Concern  . Not on file   Social History Narrative  . No narrative on file   No current facility-administered medications on file prior to encounter.   No current outpatient prescriptions on file prior to encounter.   No Known Allergies  ROS: Pertinent items in HPI  OBJECTIVE Blood pressure 92/50, pulse 74, temperature 98.2 F (36.8 C), temperature source Oral, resp. rate 16, height 5' (1.524 m), weight 52.844 kg (116 lb 8 oz), last menstrual period 11/22/2011. GENERAL: Well-developed, well-nourished female  in mild distress, worse with movement.  HEENT: Normocephalic HEART: normal rate RESP: normal effort ABDOMEN: Soft, non-tender BACK: Right CVA tenderness EXTREMITIES: Nontender, no edema NEURO: Alert and oriented SPECULUM EXAM: Deferred  LAB RESULTS Results for orders placed during the hospital encounter of 12/02/11 (from the past 24 hour(s))  URINALYSIS, ROUTINE W REFLEX MICROSCOPIC     Status: Abnormal   Collection Time   12/02/11  6:57 PM      Component Value Range   Color, Urine YELLOW  YELLOW   APPearance CLOUDY (*) CLEAR   Specific Gravity, Urine 1.025  1.005 - 1.030   pH 6.5  5.0 - 8.0   Glucose, UA NEGATIVE  NEGATIVE mg/dL   Hgb urine dipstick MODERATE (*) NEGATIVE   Bilirubin Urine NEGATIVE  NEGATIVE   Ketones, ur NEGATIVE  NEGATIVE mg/dL   Protein, ur NEGATIVE  NEGATIVE mg/dL   Urobilinogen, UA 1.0  0.0 - 1.0 mg/dL   Nitrite POSITIVE (*) NEGATIVE   Leukocytes, UA LARGE (*) NEGATIVE  URINE MICROSCOPIC-ADD ON     Status: Abnormal   Collection Time   12/02/11  6:57 PM      Component  Value Range   Squamous Epithelial / LPF FEW (*) RARE   WBC, UA 21-50  <3 WBC/hpf   RBC / HPF 3-6  <3 RBC/hpf   Bacteria, UA MANY (*) RARE  POCT PREGNANCY, URINE     Status: Normal   Collection Time   12/02/11  7:58 PM      Component Value Range   Preg Test, Ur NEGATIVE  NEGATIVE  CBC WITH DIFFERENTIAL     Status: Abnormal   Collection Time   12/02/11  8:35 PM      Component Value Range   WBC 7.3  4.0 - 10.5 K/uL   RBC 3.62 (*) 3.87 - 5.11 MIL/uL   Hemoglobin 10.5 (*) 12.0 - 15.0 g/dL   HCT 16.1 (*) 09.6 - 04.5 %   MCV 86.7  78.0 - 100.0 fL   MCH 29.0  26.0 - 34.0 pg   MCHC 33.4  30.0 - 36.0 g/dL   RDW 40.9  81.1 - 91.4 %   Platelets 175  150 - 400 K/uL   Neutrophils Relative 44  43 - 77 %   Neutro Abs 3.2  1.7 - 7.7 K/uL   Lymphocytes Relative 49 (*) 12 - 46 %   Lymphs Abs 3.6  0.7 - 4.0 K/uL   Monocytes Relative 6  3 - 12 %   Monocytes Absolute 0.5  0.1 - 1.0 K/uL   Eosinophils  Relative 1  0 - 5 %   Eosinophils Absolute 0.1  0.0 - 0.7 K/uL   Basophils Relative 0  0 - 1 %   Basophils Absolute 0.0  0.0 - 0.1 K/uL    IMAGING No results found.  ED COURSE We'll transfer to [facility] long ED per consult with Dr. Tamela Oddi do to patient's complicated kidney stone history. 2 Percocet given. 1 g Rocephin given IM per Dr. Patria Mane at [place] long ED.  ASSESSMENT 1. Renal calculi    PLAN Transfer to [facility] long ED. Dr. Patria Mane accepting physician. Medication List  As of 12/02/2011 10:02 PM   ASK your doctor about these medications         acetaminophen 500 MG tablet   Commonly known as: TYLENOL   Take 500 mg by mouth every 6 (six) hours as needed. Stomach cramps      ibuprofen 200 MG tablet   Commonly known as: ADVIL,MOTRIN   Take 200 mg by mouth every 6 (six) hours as needed. Stomach cramps            Dorathy Kinsman, [place] 12/02/2011  7:38 PM  I discussed with the patient that Dr. Tamela Oddi would like her transferred to [facility] -  for further evaluation. Patient voices understanding and is in agreement.

## 2011-12-02 NOTE — ED Notes (Signed)
Pt transferred from MAU, pt c/o R side abd pain and R sided flank pain, "squeezing". Onset yesterday. Denies n/v/d.

## 2011-12-03 ENCOUNTER — Inpatient Hospital Stay (HOSPITAL_COMMUNITY): Payer: Medicaid Other

## 2011-12-03 MED ORDER — SODIUM CHLORIDE 0.9 % IV SOLN
1000.0000 mL | Freq: Once | INTRAVENOUS | Status: AC
  Administered 2011-12-03: 1000 mL via INTRAVENOUS

## 2011-12-03 MED ORDER — ONDANSETRON 8 MG PO TBDP: 8.0000 mg | ORAL_TABLET | Freq: Three times a day (TID) | ORAL | Status: AC | PRN

## 2011-12-03 MED ORDER — CEPHALEXIN 500 MG PO CAPS: 500.0000 mg | ORAL_CAPSULE | Freq: Four times a day (QID) | ORAL | Status: AC

## 2011-12-03 MED ORDER — SODIUM CHLORIDE 0.9 % IV SOLN: 1000.0000 mL | Freq: Once | INTRAVENOUS | Status: DC

## 2011-12-03 MED ORDER — OXYCODONE-ACETAMINOPHEN 5-325 MG PO TABS: 1.0000 | ORAL_TABLET | ORAL | Status: AC | PRN

## 2011-12-03 MED ORDER — SODIUM CHLORIDE 0.9 % IV SOLN: 1000.0000 mL | INTRAVENOUS | Status: DC

## 2011-12-03 NOTE — ED Notes (Signed)
Pt returned from CT °

## 2011-12-07 ENCOUNTER — Emergency Department (HOSPITAL_COMMUNITY)
Admission: EM | Admit: 2011-12-07 | Discharge: 2011-12-07 | Discharge status: Against Medical Advice | Payer: Medicaid Other | Attending: Emergency Medicine | Admitting: Emergency Medicine

## 2011-12-07 ENCOUNTER — Encounter (HOSPITAL_COMMUNITY): Payer: Self-pay | Admitting: Emergency Medicine

## 2011-12-07 ENCOUNTER — Emergency Department (HOSPITAL_COMMUNITY): Payer: Medicaid Other

## 2011-12-07 DIAGNOSIS — R10813 Right lower quadrant abdominal tenderness: Secondary | ICD-10-CM | POA: Insufficient documentation

## 2011-12-07 DIAGNOSIS — N949 Unspecified condition associated with female genital organs and menstrual cycle: Secondary | ICD-10-CM | POA: Insufficient documentation

## 2011-12-07 DIAGNOSIS — R102 Pelvic and perineal pain: Secondary | ICD-10-CM

## 2011-12-07 DIAGNOSIS — R109 Unspecified abdominal pain: Secondary | ICD-10-CM | POA: Insufficient documentation

## 2011-12-07 LAB — WET PREP, GENITAL: Trich, Wet Prep: NONE SEEN

## 2011-12-07 LAB — POCT I-STAT, CHEM 8
BUN: 8 mg/dL (ref 6–23)
Chloride: 105 mEq/L (ref 96–112)
HCT: 35 % — ABNORMAL LOW (ref 36.0–46.0)
Potassium: 4 mEq/L (ref 3.5–5.1)

## 2011-12-07 LAB — URINALYSIS, ROUTINE W REFLEX MICROSCOPIC
Glucose, UA: NEGATIVE mg/dL
Ketones, ur: NEGATIVE mg/dL
Leukocytes, UA: NEGATIVE
Protein, ur: NEGATIVE mg/dL
pH: 6 (ref 5.0–8.0)

## 2011-12-07 LAB — URINE MICROSCOPIC-ADD ON

## 2011-12-07 LAB — POCT PREGNANCY, URINE: Preg Test, Ur: NEGATIVE

## 2011-12-07 MED ORDER — KETOROLAC TROMETHAMINE 30 MG/ML IJ SOLN
30.0000 mg | Freq: Once | INTRAMUSCULAR | Status: AC
  Administered 2011-12-07: 30 mg via INTRAVENOUS
  Filled 2011-12-07: qty 1

## 2011-12-07 MED ORDER — SODIUM CHLORIDE 0.9 % IV BOLUS (SEPSIS)
1000.0000 mL | Freq: Once | INTRAVENOUS | Status: AC
  Administered 2011-12-07: 1000 mL via INTRAVENOUS

## 2011-12-07 MED ORDER — ONDANSETRON HCL 4 MG/2ML IJ SOLN
4.0000 mg | Freq: Once | INTRAMUSCULAR | Status: AC
  Administered 2011-12-07: 4 mg via INTRAVENOUS
  Filled 2011-12-07: qty 2

## 2011-12-07 NOTE — ED Notes (Signed)
Pt informed about [place]

## 2011-12-07 NOTE — ED Notes (Signed)
Pt presenting to ed with c/o right side flank pain onset x 5 days. Pt denies nausea and vomiting at this time. Pt states she was seen here for the same x 5 days ago but the pain isn't getting any better

## 2011-12-07 NOTE — ED Notes (Signed)
Pt transported to [facility]

## 2011-12-07 NOTE — ED Provider Notes (Signed)
History     CSN: [id number]  Arrival date & time 12/07/11  1043   First MD Initiated Contact with Patient 12/07/11 1059      Chief Complaint  Patient presents with  . Flank Pain    (Consider location/radiation/quality/duration/timing/severity/associated sxs/prior treatment) Patient is a 36 y.o. female presenting with flank pain. The history is provided by the patient.  Flank Pain This is a new problem. Associated symptoms include abdominal pain. Pertinent negatives include no chest pain, fever, nausea or vomiting. Associated symptoms comments: She returns today with persistent flank pain. No N, V or fever. She was diagnosed with pyelonephritis and started on Keflex, and given percocet for pain and zofran for nausea. She returns with complaint that flank pain is no better. She states she is taking the medications as prescribed. She feels she is not producing as much urine as previously despite drinking lots of water. She has a urologist, Dr. Brunilda Payor, but has not attempted to contact his office for follow up..    Past Medical History  Diagnosis Date  . Kidney stones     Past Surgical History  Procedure Date  . Cesarean section   . Hernia repair     Family History  Problem Relation Age of Onset  . Hypertension Father   . Diabetes Father     History  Substance Use Topics  . Smoking status: Current Some Day Smoker -- 0.5 packs/day  . Smokeless tobacco: Not on file  . Alcohol Use: 1.0 oz/week    2 drink(s) per week     occasional    OB History    Grav Para Term Preterm Abortions TAB SAB Ect Mult Living   3 3 3  0 0 0 0 0 0 3      Review of Systems  Constitutional: Negative for fever.  Respiratory: Negative for shortness of breath.   Cardiovascular: Negative for chest pain.  Gastrointestinal: Positive for abdominal pain. Negative for nausea and vomiting.  Genitourinary: Positive for flank pain. Negative for dysuria and vaginal discharge.    Allergies  Review of  patient's allergies indicates no known allergies.  Home Medications   Current Outpatient Rx  Name Route Sig Dispense Refill  . ACETAMINOPHEN 500 MG PO TABS Oral Take 500 mg by mouth every 6 (six) hours as needed. Stomach cramps    . CEPHALEXIN 500 MG PO CAPS Oral Take 1 capsule (500 mg total) by mouth 4 (four) times daily. 28 capsule 0  . ONDANSETRON 8 MG PO TBDP Oral Take 1 tablet (8 mg total) by mouth every 8 (eight) hours as needed for nausea. 10 tablet 0  . OXYCODONE-ACETAMINOPHEN 5-325 MG PO TABS Oral Take 1 tablet by mouth every 4 (four) hours as needed for pain. 25 tablet 0    BP 94/49  Pulse 75  Temp 97.9 F (36.6 C) (Oral)  Resp 20  SpO2 100%  LMP 11/22/2011  Physical Exam  Constitutional: She is oriented to person, place, and time. She appears well-developed and well-nourished. No distress.  Neck: Normal range of motion.  Cardiovascular: Normal rate and regular rhythm.   No murmur heard. Pulmonary/Chest: Effort normal. She has no wheezes. She has no rales. She exhibits no tenderness.  Abdominal: Soft. Bowel sounds are normal. She exhibits no distension and no mass. There is no rebound.       Right lower quadrant tenderness that extends to right flank and right lower back. No rebound. No mass.   Neurological: She is alert and  oriented to person, place, and time.  Skin: Skin is warm.    ED Course  Procedures (including critical care time)   Labs Reviewed  POCT PREGNANCY, URINE  URINALYSIS, ROUTINE W REFLEX MICROSCOPIC   Results for orders placed during the hospital encounter of 12/07/11  URINALYSIS, ROUTINE W REFLEX MICROSCOPIC      Component Value Range   Color, Urine YELLOW  YELLOW   APPearance CLEAR  CLEAR   Specific Gravity, Urine 1.020  1.005 - 1.030   pH 6.0  5.0 - 8.0   Glucose, UA NEGATIVE  NEGATIVE mg/dL   Hgb urine dipstick TRACE (*) NEGATIVE   Bilirubin Urine NEGATIVE  NEGATIVE   Ketones, ur NEGATIVE  NEGATIVE mg/dL   Protein, ur NEGATIVE   NEGATIVE mg/dL   Urobilinogen, UA 1.0  0.0 - 1.0 mg/dL   Nitrite NEGATIVE  NEGATIVE   Leukocytes, UA NEGATIVE  NEGATIVE  POCT PREGNANCY, URINE      Component Value Range   Preg Test, Ur NEGATIVE  NEGATIVE  WET PREP, GENITAL      Component Value Range   Yeast Wet Prep HPF POC NONE SEEN  NONE SEEN   Trich, Wet Prep NONE SEEN  NONE SEEN   Clue Cells Wet Prep HPF POC FEW (*) NONE SEEN   WBC, Wet Prep HPF POC FEW (*) NONE SEEN  GC/CHLAMYDIA PROBE AMP, GENITAL      Component Value Range   GC Probe Amp, Genital NEGATIVE  NEGATIVE   Chlamydia, DNA Probe NEGATIVE  NEGATIVE  URINE MICROSCOPIC-ADD ON      Component Value Range   Squamous Epithelial / LPF FEW (*) RARE   WBC, UA 0-2  <3 WBC/hpf   RBC / HPF 0-2  <3 RBC/hpf   Bacteria, UA FEW (*) RARE   Urine-Other MUCOUS PRESENT    POCT I-STAT, CHEM 8      Component Value Range   Sodium 139  135 - 145 mEq/L   Potassium 4.0  3.5 - 5.1 mEq/L   Chloride 105  96 - 112 mEq/L   BUN 8  6 - 23 mg/dL   Creatinine, Ser 9.81  0.50 - 1.10 mg/dL   Glucose, Bld 87  70 - 99 mg/dL   Calcium, Ion 1.91  4.78 - 1.23 mmol/L   TCO2 25  0 - 100 mmol/L   Hemoglobin 11.9 (*) 12.0 - 15.0 g/dL   HCT 29.5 (*) 62.1 - 30.8 %   Ct Abdomen Pelvis Wo Contrast  12/03/2011  *RADIOLOGY REPORT*  Clinical Data: Right flank pain.  History of kidney stones and right nephrostomy.  CT ABDOMEN AND PELVIS WITHOUT CONTRAST  Technique:  Multidetector CT imaging of the abdomen and pelvis was performed following the standard protocol without intravenous contrast.  Comparison: Abdominal pelvic CT 02/23/2006.  Findings: The lung bases are clear.  There is no pleural effusion. There is scarring in the lower pole of the right kidney with minimal parenchymal calcification.  No urinary tract calculus is seen.  There is no hydronephrosis or perinephric soft tissue stranding.  There is no evidence of ureteral or bladder calculus.  As evaluated in the noncontrast state, the liver, gallbladder,  pancreas, spleen and adrenal glands appear normal.  The bowel gas pattern is normal.  No inflammatory changes are seen. The appendix appears normal.  Mildly prominent uterus appears stable without focal abnormality. There is no evidence of adnexal mass or pelvic inflammatory process.  IMPRESSION:  1.  Cortical scarring in the lower  pole of the right kidney with minimal parenchymal calcification. 2.  No residual/recurrent urinary tract calculus.  No hydronephrosis. 3.  No acute inflammatory changes.   Original Report Authenticated By: Gerrianne Scale, M.D.    US Transvaginal Non-ob  12/07/2011  *RADIOLOGY REPORT*  Clinical Data: Right adnexal pain.  TRANSABDOMINAL AND TRANSVAGINAL ULTRASOUND OF PELVIS DOPPLER ULTRASOUND OF OVARIES  Technique:  Both transabdominal and transvaginal ultrasound examinations of the pelvis were performed including evaluation of the uterus, ovaries, adnexal regions, and pelvic cul-de-sac. Color and duplex Doppler ultrasound was utilized to evaluate blood flow to the ovaries.  Comparison: CT 12/03/2011  Findings:  Uterus: 10.0 x 5.4 x 6.1 cm.  Normal echotexture.  No focal abnormality.  Endometrium: Normal appearance and thickness, 10 mm.  Right Ovary: 3.2 x 1.6 x 2.1 cm. Normal size and echotexture.  No adnexal masses.  Normal arterial and venous blood flow.  Left Ovary: 3.6 x 2.3 x 3.2 cm.  1.2 cm dominant follicle. Normal size and echotexture.  No adnexal masses.  Normal arterial and venous blood flow.  Other Findings:  Trace free fluid in the pelvis.  IMPRESSION: Unremarkable study.   Original Report Authenticated By: Cyndie Chime, M.D.    US Pelvis Complete  12/07/2011  *RADIOLOGY REPORT*  Clinical Data: Right adnexal pain.  TRANSABDOMINAL AND TRANSVAGINAL ULTRASOUND OF PELVIS DOPPLER ULTRASOUND OF OVARIES  Technique:  Both transabdominal and transvaginal ultrasound examinations of the pelvis were performed including evaluation of the uterus, ovaries, adnexal regions, and pelvic  cul-de-sac. Color and duplex Doppler ultrasound was utilized to evaluate blood flow to the ovaries.  Comparison: CT 12/03/2011  Findings:  Uterus: 10.0 x 5.4 x 6.1 cm.  Normal echotexture.  No focal abnormality.  Endometrium: Normal appearance and thickness, 10 mm.  Right Ovary: 3.2 x 1.6 x 2.1 cm. Normal size and echotexture.  No adnexal masses.  Normal arterial and venous blood flow.  Left Ovary: 3.6 x 2.3 x 3.2 cm.  1.2 cm dominant follicle. Normal size and echotexture.  No adnexal masses.  Normal arterial and venous blood flow.  Other Findings:  Trace free fluid in the pelvis.  IMPRESSION: Unremarkable study.   Original Report Authenticated By: Cyndie Chime, M.D.    [place] Art/ven Flow Abd Pelv Doppler  12/07/2011  *RADIOLOGY REPORT*  Clinical Data: Right adnexal pain.  TRANSABDOMINAL AND TRANSVAGINAL ULTRASOUND OF PELVIS DOPPLER ULTRASOUND OF OVARIES  Technique:  Both transabdominal and transvaginal ultrasound examinations of the pelvis were performed including evaluation of the uterus, ovaries, adnexal regions, and pelvic cul-de-sac. Color and duplex Doppler ultrasound was utilized to evaluate blood flow to the ovaries.  Comparison: CT 12/03/2011  Findings:  Uterus: 10.0 x 5.4 x 6.1 cm.  Normal echotexture.  No focal abnormality.  Endometrium: Normal appearance and thickness, 10 mm.  Right Ovary: 3.2 x 1.6 x 2.1 cm. Normal size and echotexture.  No adnexal masses.  Normal arterial and venous blood flow.  Left Ovary: 3.6 x 2.3 x 3.2 cm.  1.2 cm dominant follicle. Normal size and echotexture.  No adnexal masses.  Normal arterial and venous blood flow.  Other Findings:  Trace free fluid in the pelvis.  IMPRESSION: Unremarkable study.   Original Report Authenticated By: Cyndie Chime, M.D.    No results found.   No diagnosis found.  1. Pelvic pain 2. Flank pain   MDM  Significant right adnexal tenderness without mass. Ultrasound ordered. Pain improved with Toradol. Delay in obtaining ultrasound.  Patient remained comfortable.  She informed staff she had to leave due to child care constraints. Patient left prior to discharge. AMA        Rodena Medin, PA-C 12/17/11 1043

## 2011-12-07 NOTE — ED Notes (Signed)
Pt refusing to stay, stating she has to go pick up daughter. Refusing to sign.

## 2011-12-08 LAB — GC/CHLAMYDIA PROBE AMP, GENITAL: Chlamydia, DNA Probe: NEGATIVE

## 2011-12-17 NOTE — ED Provider Notes (Signed)
Medical screening examination/treatment/procedure(s) were performed by non-physician practitioner and as supervising physician I was immediately available for consultation/collaboration.  Dontai Pember, MD 12/17/11 1640 

## 2012-03-22 ENCOUNTER — Encounter (HOSPITAL_COMMUNITY): Payer: Self-pay | Admitting: Emergency Medicine

## 2012-03-22 ENCOUNTER — Emergency Department (HOSPITAL_COMMUNITY): Payer: Medicaid Other

## 2012-03-22 ENCOUNTER — Emergency Department (HOSPITAL_COMMUNITY)
Admission: EM | Admit: 2012-03-22 | Discharge: 2012-03-22 | Disposition: A | Discharge status: Home / Self Care | Payer: Medicaid Other | Attending: Emergency Medicine | Admitting: Emergency Medicine

## 2012-03-22 DIAGNOSIS — Z87442 Personal history of urinary calculi: Secondary | ICD-10-CM | POA: Insufficient documentation

## 2012-03-22 DIAGNOSIS — F17200 Nicotine dependence, unspecified, uncomplicated: Secondary | ICD-10-CM | POA: Insufficient documentation

## 2012-03-22 DIAGNOSIS — R51 Headache: Secondary | ICD-10-CM | POA: Insufficient documentation

## 2012-03-22 MED ORDER — PROMETHAZINE HCL 25 MG PO TABS: 25.0000 mg | ORAL_TABLET | Freq: Four times a day (QID) | ORAL | Status: DC | PRN

## 2012-03-22 MED ORDER — IBUPROFEN 800 MG PO TABS
800.0000 mg | ORAL_TABLET | Freq: Once | ORAL | Status: AC
  Administered 2012-03-22: 800 mg via ORAL
  Filled 2012-03-22: qty 1

## 2012-03-22 MED ORDER — PROMETHAZINE HCL 25 MG PO TABS
25.0000 mg | ORAL_TABLET | Freq: Once | ORAL | Status: AC
  Administered 2012-03-22: 25 mg via ORAL
  Filled 2012-03-22: qty 1

## 2012-03-22 NOTE — ED Notes (Signed)
Pt c/o HA x4 days. No history on migraines. Pt denies n/v

## 2012-03-22 NOTE — ED Notes (Signed)
NAD noted at time of d/c home 

## 2012-03-22 NOTE — ED Provider Notes (Signed)
History     CSN: [id number]  Arrival date & time 03/22/12  4696   First MD Initiated Contact with Patient 03/22/12 [phone]      Chief Complaint  Patient presents with  . Headache    (Consider location/radiation/quality/duration/timing/severity/associated sxs/prior treatment) HPI  Pt to the ER with complaints of headache for 4 days. She describes the pain as right parietal deep  sharp pains that last approx 10 seconds and then resolve. The symptoms happen frequently. She has  not wanted to do anything and has not gone to work because of the pain. She does not have a hx of  headaches and has no other associated symptoms. No weakness, confusion, neck pains, fevers,  N/V/D, abd pains, AMS. Pt is acting normal per her mom. nad vss  Past Medical History  Diagnosis Date  . Kidney stones     Past Surgical History  Procedure Date  . Cesarean section   . Hernia repair     Family History  Problem Relation Age of Onset  . Hypertension Father   . Diabetes Father     History  Substance Use Topics  . Smoking status: Current Some Day Smoker -- 0.5 packs/day  . Smokeless tobacco: Not on file  . Alcohol Use: 1.0 oz/week    2 drink(s) per week     Comment: occasional    OB History    Grav Para Term Preterm Abortions TAB SAB Ect Mult Living   3 3 3  0 0 0 0 0 0 3      Review of Systems  Review of Systems  Gen: no weight loss, fevers, chills, night sweats  Eyes: no discharge or drainage, no occular pain or visual changes  Nose: no epistaxis or rhinorrhea  Mouth: no dental pain, no sore throat  Neck: no neck pain  Lungs:No wheezing, coughing or hemoptysis CV: no chest pain, palpitations, dependent edema or orthopnea  Abd: no abdominal pain, nausea, vomiting  GU: no dysuria or gross hematuria  MSK:  No abnormalities  Neuro: + headache, no focal neurologic deficits  Skin: no abnormalities Psyche: negative.   Allergies  Review of patient's allergies indicates no known  allergies.  Home Medications   Current Outpatient Rx  Name  Route  Sig  Dispense  Refill  . PROMETHAZINE HCL 25 MG PO TABS   Oral   Take 1 tablet (25 mg total) by mouth every 6 (six) hours as needed (Headache).   14 tablet   0     BP 106/65  Pulse 68  Temp 97.9 F (36.6 C) (Oral)  Resp 20  Ht 5\' 1"  (1.549 m)  Wt 115 lb (52.164 kg)  BMI 21.73 kg/m2  SpO2 100%  LMP 03/15/2012  Physical Exam  Nursing note and vitals reviewed. Constitutional: She is oriented to person, place, and time. She appears well-developed and well-nourished. No distress.  HENT:  Head: Normocephalic and atraumatic.  Eyes: Pupils are equal, round, and reactive to light.  Neck: Normal range of motion. Neck supple.  Cardiovascular: Normal rate and regular rhythm.   Pulmonary/Chest: Effort normal.  Abdominal: Soft.  Neurological: She is alert and oriented to person, place, and time. She has normal strength. No cranial nerve deficit or sensory deficit. She displays a negative Romberg sign.  Skin: Skin is warm and dry.    ED Course  Procedures (including critical care time)  Labs Reviewed - No data to display Ct Head Wo Contrast  03/22/2012  *RADIOLOGY REPORT*  Clinical  Data: Headache.  CT HEAD WITHOUT CONTRAST  Technique:  Contiguous axial images were obtained from the base of the skull through the vertex without contrast.  Comparison: No priors.  Findings: No acute intracranial abnormalities.  Specifically, no evidence of acute intracranial hemorrhage, no definite region to suggest acute/subacute cerebral ischemia, no mass, mass effect, hydrocephalus or abnormal intra or extra-axial fluid collections. Visualized paranasal sinuses and mastoids are well pneumatized.  No acute displaced skull fractures are identified.  IMPRESSION: 1.  No acute intracranial abnormalities. 2.  The appearance of the brain is normal.   Original Report Authenticated By: Trudie Reed, M.D.      1. Headache       MDM  Pt  has normal Head CT.  Will Rx Phenergan and Ibuprofen.  Neurology f/u given if pt continues to have the stabbing pains.  Pt has been advised of the symptoms that warrant their return to the ED. Patient has voiced understanding and has agreed to follow-up with the PCP or specialist.         Dorthula Matas, PA 03/22/12 1016

## 2012-03-23 NOTE — ED Provider Notes (Signed)
Medical screening examination/treatment/procedure(s) were performed by non-physician practitioner and as supervising physician I was immediately available for consultation/collaboration.  Damontae Loppnow R. Tu Bayle, MD 03/23/12 1539 

## 2013-01-24 ENCOUNTER — Emergency Department (HOSPITAL_COMMUNITY)
Admission: EM | Admit: 2013-01-24 | Discharge: 2013-01-24 | Discharge status: Absent without leave | Payer: Medicaid Other | Source: Home / Self Care

## 2013-03-15 ENCOUNTER — Emergency Department (HOSPITAL_COMMUNITY)
Admission: EM | Admit: 2013-03-15 | Discharge: 2013-03-15 | Discharge status: Against Medical Advice | Payer: Medicaid Other | Attending: Emergency Medicine | Admitting: Emergency Medicine

## 2013-03-15 ENCOUNTER — Encounter (HOSPITAL_COMMUNITY): Payer: Self-pay | Admitting: Emergency Medicine

## 2013-03-15 ENCOUNTER — Emergency Department (HOSPITAL_COMMUNITY): Payer: Medicaid Other

## 2013-03-15 DIAGNOSIS — F17200 Nicotine dependence, unspecified, uncomplicated: Secondary | ICD-10-CM | POA: Insufficient documentation

## 2013-03-15 DIAGNOSIS — R079 Chest pain, unspecified: Secondary | ICD-10-CM | POA: Insufficient documentation

## 2013-03-15 LAB — POCT I-STAT TROPONIN I: Troponin i, poc: 0 ng/mL (ref 0.00–0.08)

## 2013-03-15 LAB — CBC
MCHC: 34.6 g/dL (ref 30.0–36.0)
Platelets: 194 10*3/uL (ref 150–400)
RDW: 13.6 % (ref 11.5–15.5)

## 2013-03-15 LAB — BASIC METABOLIC PANEL
Calcium: 9 mg/dL (ref 8.4–10.5)
GFR calc Af Amer: >90 mL/min (ref >90)
GFR calc non Af Amer: >90 mL/min (ref >90)
Potassium: 3.5 mEq/L (ref 3.5–5.1)
Sodium: 137 mEq/L (ref 135–145)

## 2013-03-15 NOTE — ED Notes (Signed)
No answer in waiting room when called by Nurse 1st to reassess pt.

## 2013-03-15 NOTE — ED Notes (Signed)
No answer in waiting area.

## 2013-03-15 NOTE — ED Notes (Signed)
Pt c/o mid sternal CP that is worse with cough and movement x 2 days

## 2013-04-04 ENCOUNTER — Ambulatory Visit: Payer: Self-pay | Admitting: Obstetrics

## 2013-04-13 ENCOUNTER — Ambulatory Visit: Payer: Self-pay | Admitting: Obstetrics

## 2013-04-26 ENCOUNTER — Ambulatory Visit: Payer: Self-pay | Admitting: Obstetrics

## 2013-05-09 ENCOUNTER — Ambulatory Visit (INDEPENDENT_AMBULATORY_CARE_PROVIDER_SITE_OTHER): Payer: Medicaid Other | Admitting: Obstetrics

## 2013-05-09 ENCOUNTER — Ambulatory Visit (INDEPENDENT_AMBULATORY_CARE_PROVIDER_SITE_OTHER): Payer: Medicaid Other | Admitting: *Deleted

## 2013-05-09 ENCOUNTER — Encounter: Payer: Self-pay | Admitting: Obstetrics

## 2013-05-09 VITALS — BP 112/62 | HR 85 | Temp 98.8°F | Ht 60.0 in | Wt 118.0 lb

## 2013-05-09 DIAGNOSIS — N939 Abnormal uterine and vaginal bleeding, unspecified: Secondary | ICD-10-CM | POA: Insufficient documentation

## 2013-05-09 DIAGNOSIS — Z3202 Encounter for pregnancy test, result negative: Secondary | ICD-10-CM

## 2013-05-09 DIAGNOSIS — N760 Acute vaginitis: Secondary | ICD-10-CM

## 2013-05-09 DIAGNOSIS — A499 Bacterial infection, unspecified: Secondary | ICD-10-CM

## 2013-05-09 DIAGNOSIS — Z0000 Encounter for general adult medical examination without abnormal findings: Secondary | ICD-10-CM

## 2013-05-09 DIAGNOSIS — N926 Irregular menstruation, unspecified: Secondary | ICD-10-CM

## 2013-05-09 DIAGNOSIS — N946 Dysmenorrhea, unspecified: Secondary | ICD-10-CM | POA: Insufficient documentation

## 2013-05-09 DIAGNOSIS — B9689 Other specified bacterial agents as the cause of diseases classified elsewhere: Secondary | ICD-10-CM

## 2013-05-09 LAB — POCT URINE PREGNANCY: Preg Test, Ur: NEGATIVE

## 2013-05-09 MED ORDER — MEDROXYPROGESTERONE ACETATE 150 MG/ML IM SUSP: 150.0000 mg | INTRAMUSCULAR | Status: DC

## 2013-05-09 MED ORDER — IBUPROFEN 800 MG PO TABS: 800.0000 mg | ORAL_TABLET | Freq: Three times a day (TID) | ORAL | Status: DC | PRN

## 2013-05-09 MED ORDER — MEDROXYPROGESTERONE ACETATE 150 MG/ML IM SUSP
150.0000 mg | INTRAMUSCULAR | Status: AC
  Administered 2013-05-09 – 2013-08-02 (×2): 150 mg via INTRAMUSCULAR

## 2013-05-09 NOTE — Progress Notes (Signed)
Depo given today in Left upper outer quadrant. UPT preformed, results were negative. Patient tolerated well.

## 2013-05-09 NOTE — Progress Notes (Signed)
Subjective:     Dominique [name] is a 38 y.o. female here for a routine exam.  Current complaints: Patient in office today for an annual exam. Patient states she would like to talk about how to regulate her cycles. Patient states she is having 2 cycles a month. Patient states she had tried the NUVA RING but that it didn't work. Patient states cycles are very heavy and painful.   Personal health questionnaire reviewed: yes.   Gynecologic History Patient's last menstrual period was 05/02/2013. Contraception: tubal ligation Last Pap: 08/18/2007. Results were: normal  Obstetric History OB History  Gravida Para Term Preterm AB SAB TAB Ectopic Multiple Living  3 3 3  0 0 0 0 0 0 3    # Outcome Date GA Lbr Len/2nd Weight Sex Delivery Anes PTL Lv  3 TRM 11/19/99 [redacted]w[redacted]d  6 lb 6 oz (2.892 kg) F LTCS Spinal  Y  2 TRM 02/08/96 [redacted]w[redacted]d  6 lb 2 oz (2.778 kg) M LTCS Spinal  Y  1 TRM 12/31/92 [redacted]w[redacted]d  5 lb 12 oz (2.608 kg) F LTCS Spinal  Y       The following portions of the patient's history were reviewed and updated as appropriate: allergies, current medications, past family history, past medical history, past social history, past surgical history and problem list.  Review of Systems Pertinent items are noted in HPI.    Objective:    General appearance: alert and no distress  Breasts:  Negative for masses, tenderness or nipple discharge. Abdomen: normal findings: soft, non-tender Pelvic: cervix normal in appearance, external genitalia normal, no adnexal masses or tenderness, no cervical motion tenderness, rectovaginal septum normal, uterus normal size, shape, and consistency and vagina with thin, grey discharge.    Assessment:    Healthy female exam.   AUB  BV   Plan:        AUB     ( Patient has had BTL, so does not need contraception )      Wants Depo for control of AUB.  Other options offered.       Depo Provera Rx.       Ultrasound ordered.

## 2013-05-10 LAB — PAP IG W/ RFLX HPV ASCU

## 2013-05-10 LAB — GC/CHLAMYDIA PROBE AMP
CT Probe RNA: NEGATIVE
GC PROBE AMP APTIMA: NEGATIVE

## 2013-05-10 LAB — WET PREP BY MOLECULAR PROBE
CANDIDA SPECIES: NEGATIVE
Gardnerella vaginalis: POSITIVE — AB
Trichomonas vaginosis: NEGATIVE

## 2013-05-11 ENCOUNTER — Ambulatory Visit (HOSPITAL_COMMUNITY): Admission: RE | Admit: 2013-05-11 | Payer: Medicaid Other | Source: Ambulatory Visit

## 2013-05-11 ENCOUNTER — Other Ambulatory Visit: Payer: Self-pay | Admitting: *Deleted

## 2013-05-11 DIAGNOSIS — N760 Acute vaginitis: Principal | ICD-10-CM

## 2013-05-11 DIAGNOSIS — B9689 Other specified bacterial agents as the cause of diseases classified elsewhere: Secondary | ICD-10-CM

## 2013-05-11 MED ORDER — METRONIDAZOLE 500 MG PO TABS: 500.0000 mg | ORAL_TABLET | Freq: Two times a day (BID) | ORAL | Status: DC

## 2013-05-30 ENCOUNTER — Ambulatory Visit (HOSPITAL_COMMUNITY): Admission: RE | Admit: 2013-05-30 | Payer: Medicaid Other | Source: Ambulatory Visit

## 2013-07-24 ENCOUNTER — Other Ambulatory Visit: Payer: Self-pay | Admitting: Obstetrics

## 2013-07-31 ENCOUNTER — Other Ambulatory Visit: Payer: Self-pay | Admitting: *Deleted

## 2013-07-31 ENCOUNTER — Ambulatory Visit: Payer: Medicaid Other

## 2013-07-31 DIAGNOSIS — N939 Abnormal uterine and vaginal bleeding, unspecified: Secondary | ICD-10-CM

## 2013-07-31 MED ORDER — MEDROXYPROGESTERONE ACETATE 150 MG/ML IM SUSP: 150.0000 mg | INTRAMUSCULAR | Status: DC

## 2013-08-02 ENCOUNTER — Ambulatory Visit (INDEPENDENT_AMBULATORY_CARE_PROVIDER_SITE_OTHER): Payer: Medicaid Other | Admitting: *Deleted

## 2013-08-02 VITALS — BP 113/74 | HR 72 | Temp 98.0°F | Ht 60.0 in | Wt 116.0 lb

## 2013-08-02 DIAGNOSIS — IMO0001 Reserved for inherently not codable concepts without codable children: Secondary | ICD-10-CM

## 2013-08-02 DIAGNOSIS — Z309 Encounter for contraceptive management, unspecified: Secondary | ICD-10-CM

## 2013-08-02 NOTE — Progress Notes (Signed)
Patient is in the office today for her Depo Injection. Patient is on time for her Injection. Patient denies any concerns. Injection given in Left Upper Outer Quadrant. Patient tolerated well. Patient notified to tell the front that she needs an appointment for October 24, 2013 for her next Depo Injection.  BP 113/74  Pulse 72  Temp(Src) 98 F (36.7 C)  Ht 5' (1.524 m)  Wt 116 lb (52.617 kg)  BMI 22.65 kg/m2  LMP 05/02/2013 Administrations This Visit   medroxyPROGESTERone (DEPO-PROVERA) injection 150 mg   Administered Action Dose Route Administered By   08/02/2013 Given 150 mg Intramuscular Odessa Fleming, LPN

## 2013-09-06 ENCOUNTER — Ambulatory Visit: Payer: Medicaid Other | Admitting: Obstetrics

## 2013-09-18 ENCOUNTER — Ambulatory Visit: Payer: Self-pay | Admitting: Obstetrics

## 2013-09-20 ENCOUNTER — Emergency Department (HOSPITAL_COMMUNITY): Payer: Medicaid Other

## 2013-09-20 ENCOUNTER — Emergency Department (HOSPITAL_COMMUNITY)
Admission: EM | Admit: 2013-09-20 | Discharge: 2013-09-20 | Disposition: A | Discharge status: Home / Self Care | Payer: Medicaid Other | Attending: Emergency Medicine | Admitting: Emergency Medicine

## 2013-09-20 ENCOUNTER — Encounter (HOSPITAL_COMMUNITY): Payer: Self-pay | Admitting: Emergency Medicine

## 2013-09-20 DIAGNOSIS — Z79899 Other long term (current) drug therapy: Secondary | ICD-10-CM | POA: Insufficient documentation

## 2013-09-20 DIAGNOSIS — R109 Unspecified abdominal pain: Secondary | ICD-10-CM | POA: Insufficient documentation

## 2013-09-20 DIAGNOSIS — Z87442 Personal history of urinary calculi: Secondary | ICD-10-CM | POA: Insufficient documentation

## 2013-09-20 DIAGNOSIS — R0789 Other chest pain: Secondary | ICD-10-CM | POA: Insufficient documentation

## 2013-09-20 DIAGNOSIS — R519 Headache, unspecified: Secondary | ICD-10-CM

## 2013-09-20 DIAGNOSIS — Z3202 Encounter for pregnancy test, result negative: Secondary | ICD-10-CM | POA: Insufficient documentation

## 2013-09-20 DIAGNOSIS — Z792 Long term (current) use of antibiotics: Secondary | ICD-10-CM | POA: Insufficient documentation

## 2013-09-20 DIAGNOSIS — R51 Headache: Secondary | ICD-10-CM | POA: Insufficient documentation

## 2013-09-20 DIAGNOSIS — N390 Urinary tract infection, site not specified: Secondary | ICD-10-CM | POA: Insufficient documentation

## 2013-09-20 DIAGNOSIS — Z9889 Other specified postprocedural states: Secondary | ICD-10-CM | POA: Insufficient documentation

## 2013-09-20 DIAGNOSIS — F17200 Nicotine dependence, unspecified, uncomplicated: Secondary | ICD-10-CM | POA: Insufficient documentation

## 2013-09-20 LAB — URINALYSIS, ROUTINE W REFLEX MICROSCOPIC
BILIRUBIN URINE: NEGATIVE
GLUCOSE, UA: NEGATIVE mg/dL
KETONES UR: NEGATIVE mg/dL
NITRITE: POSITIVE — AB
Protein, ur: NEGATIVE mg/dL
Specific Gravity, Urine: 1.021 (ref 1.005–1.030)
Urobilinogen, UA: 0.2 mg/dL (ref 0.0–1.0)
pH: 6 (ref 5.0–8.0)

## 2013-09-20 LAB — I-STAT TROPONIN, ED: Troponin i, poc: 0 ng/mL (ref 0.00–0.08)

## 2013-09-20 LAB — LIPASE, BLOOD: LIPASE: 75 U/L — AB (ref 11–59)

## 2013-09-20 LAB — URINE MICROSCOPIC-ADD ON

## 2013-09-20 LAB — BASIC METABOLIC PANEL
BUN: 11 mg/dL (ref 6–23)
CALCIUM: 9.4 mg/dL (ref 8.4–10.5)
CO2: 23 meq/L (ref 19–32)
Chloride: 105 mEq/L (ref 96–112)
Creatinine, Ser: 0.78 mg/dL (ref 0.50–1.10)
GFR calc Af Amer: >90 mL/min (ref >90)
GFR calc non Af Amer: >90 mL/min (ref >90)
GLUCOSE: 88 mg/dL (ref 70–99)
POTASSIUM: 3.9 meq/L (ref 3.7–5.3)
SODIUM: 141 meq/L (ref 137–147)

## 2013-09-20 LAB — CBC
HEMATOCRIT: 33.4 % — AB (ref 36.0–46.0)
HEMOGLOBIN: 11 g/dL — AB (ref 12.0–15.0)
MCH: 28.5 pg (ref 26.0–34.0)
MCHC: 32.9 g/dL (ref 30.0–36.0)
MCV: 86.5 fL (ref 78.0–100.0)
Platelets: 181 10*3/uL (ref 150–400)
RBC: 3.86 MIL/uL — AB (ref 3.87–5.11)
RDW: 13.8 % (ref 11.5–15.5)
WBC: 6 10*3/uL (ref 4.0–10.5)

## 2013-09-20 LAB — PREGNANCY, URINE: Preg Test, Ur: NEGATIVE

## 2013-09-20 LAB — PRO B NATRIURETIC PEPTIDE: PRO B NATRI PEPTIDE: 52.2 pg/mL (ref 0–125)

## 2013-09-20 MED ORDER — BUTALBITAL-APAP-CAFFEINE 50-325-40 MG PO TABS: 1.0000 | ORAL_TABLET | Freq: Four times a day (QID) | ORAL | Status: DC | PRN

## 2013-09-20 MED ORDER — CEPHALEXIN 500 MG PO CAPS: 500.0000 mg | ORAL_CAPSULE | Freq: Two times a day (BID) | ORAL | Status: DC

## 2013-09-20 MED ORDER — SODIUM CHLORIDE 0.9 % IV BOLUS (SEPSIS)
1000.0000 mL | Freq: Once | INTRAVENOUS | Status: AC
  Administered 2013-09-20: 1000 mL via INTRAVENOUS

## 2013-09-20 MED ORDER — METOCLOPRAMIDE HCL 5 MG/ML IJ SOLN
10.0000 mg | Freq: Once | INTRAMUSCULAR | Status: AC
  Administered 2013-09-20: 10 mg via INTRAVENOUS
  Filled 2013-09-20: qty 2

## 2013-09-20 MED ORDER — DIPHENHYDRAMINE HCL 50 MG/ML IJ SOLN
25.0000 mg | Freq: Once | INTRAMUSCULAR | Status: AC
  Administered 2013-09-20: 25 mg via INTRAVENOUS
  Filled 2013-09-20: qty 1

## 2013-09-20 MED ORDER — DEXAMETHASONE SODIUM PHOSPHATE 10 MG/ML IJ SOLN
10.0000 mg | Freq: Once | INTRAMUSCULAR | Status: AC
  Administered 2013-09-20: 10 mg via INTRAVENOUS
  Filled 2013-09-20: qty 1

## 2013-09-20 MED ORDER — CEFTRIAXONE SODIUM 1 G IJ SOLR
1.0000 g | Freq: Once | INTRAMUSCULAR | Status: AC
  Administered 2013-09-20: 1 g via INTRAVENOUS
  Filled 2013-09-20: qty 10

## 2013-09-20 NOTE — ED Provider Notes (Signed)
CSN: [id number]     Arrival date & time 09/20/13  1437 History   First MD Initiated Contact with Patient 09/20/13 1731     Chief Complaint  Patient presents with  . Chest Pain  . Headache  . Emesis     (Consider location/radiation/quality/duration/timing/severity/associated sxs/prior Treatment) HPI  Dominique [name] is a 38 y.o. female complaining of headache, chest pain, and abdominal pain. The eadache started about a week ago and is located across her forehead. The pain is 8 out of 10. She  denies sensitivity to light, loud sounds, and visual changes. Topamax has not helped improve the pain.  Her chest pain started yesterday. It has consisted of 3 different episodes that last 1 to 2 minutes, while lying down. It is a sharp pain in the mid-sternal area. There is no chest pain at the moment. There is no pain upon exertion. She has also had abdominal pain below her umbilicus. The pain feels like menstrual  cramps. She has been urinating more frequently. Since the pain started, her appetite has been  decreased. She denies fever, nausea and vomiting, diarrhea, dysuria, dyspnea, and diaphoresis.  Past Medical History  Diagnosis Date  . Kidney stones    Past Surgical History  Procedure Laterality Date  . Cesarean section    . Hernia repair     Family History  Problem Relation Age of Onset  . Hypertension Father   . Diabetes Father    History  Substance Use Topics  . Smoking status: Current Every Day Smoker -- 0.50 packs/day    Types: Cigarettes  . Smokeless tobacco: Never Used  . Alcohol Use: 1.0 oz/week    2 drink(s) per week     Comment: occasional   OB History   Grav Para Term Preterm Abortions TAB SAB Ect Mult Living   3 3 3  0 0 0 0 0 0 3     Review of Systems  10 systems reviewed and found to be negative, except as noted in the HPI.   Allergies  Review of patient's allergies indicates no known allergies.  Home Medications   Prior to Admission medications    Medication Sig Start Date End Date Taking? Authorizing Provider  acetaminophen (TYLENOL) 500 MG tablet Take 500 mg by mouth every 6 (six) hours as needed (for pain).    Yes Historical Provider, MD  ibuprofen (ADVIL,MOTRIN) 800 MG tablet Take 1 tablet (800 mg total) by mouth every 8 (eight) hours as needed. 05/09/13  Yes 07/07/13, MD  medroxyPROGESTERone (DEPO-PROVERA) 150 MG/ML injection Inject 1 mL (150 mg total) into the muscle every 3 (three) months. 07/31/13  Yes 09/30/13, MD  topiramate (TOPAMAX) 25 MG tablet Take 25 mg by mouth daily.   Yes Historical Provider, MD  butalbital-acetaminophen-caffeine (FIORICET) 50-325-40 MG per tablet Take 1-2 tablets by mouth every 6 (six) hours as needed for headache. 09/20/13 09/20/14  09/22/14 Pisciotta, PA-C  cephALEXin (KEFLEX) 500 MG capsule Take 1 capsule (500 mg total) by mouth 2 (two) times daily. 09/20/13   Nicole Pisciotta, PA-C   BP 119/57  Pulse 51  Temp(Src) 97.8 F (36.6 C) (Oral)  Resp 16  Ht 5\' 3"  (1.6 m)  SpO2 100%  LMP 02/27/2013 Physical Exam  Nursing note and vitals reviewed. Constitutional: She is oriented to person, place, and time. She appears well-developed and well-nourished. No distress.  HENT:  Head: Normocephalic and atraumatic.  Mouth/Throat: Oropharynx is clear and moist.  Eyes: Conjunctivae and EOM are  normal. Pupils are equal, round, and reactive to light.  Neck: Normal range of motion.  Cardiovascular: Normal rate, regular rhythm and intact distal pulses.   Pulmonary/Chest: Effort normal and breath sounds normal. No stridor. No respiratory distress. She has no wheezes. She has no rales. She exhibits no tenderness.  Abdominal: Soft. Bowel sounds are normal. She exhibits no distension and no mass. There is no tenderness. There is no rebound and no guarding.  Musculoskeletal: Normal range of motion. She exhibits no edema and no tenderness.  No calf asymmetry, superficial collaterals, palpable cords, edema,  Homans sign negative bilaterally.    Neurological: She is alert and oriented to person, place, and time.  II-Visual fields grossly intact. III/IV/VI-Extraocular movements intact.  Pupils reactive bilaterally. V/VII-Smile symmetric, equal eyebrow raise,  facial sensation intact VIII- Hearing grossly intact IX/X-Normal gag XI-bilateral shoulder shrug XII-midline tongue extension Motor: 5/5 bilaterally with normal tone and bulk Cerebellar: Normal finger-to-nose  and normal heel-to-shin test.   Romberg negative Ambulates with a coordinated gait   Psychiatric: She has a normal mood and affect.    ED Course  Procedures (including critical care time) Labs Review Labs Reviewed  CBC - Abnormal; Notable for the following:    RBC 3.86 (*)    Hemoglobin 11.0 (*)    HCT 33.4 (*)    All other components within normal limits  LIPASE, BLOOD - Abnormal; Notable for the following:    Lipase 75 (*)    All other components within normal limits  URINALYSIS, ROUTINE W REFLEX MICROSCOPIC - Abnormal; Notable for the following:    APPearance CLOUDY (*)    Hgb urine dipstick SMALL (*)    Nitrite POSITIVE (*)    Leukocytes, UA SMALL (*)    All other components within normal limits  URINE MICROSCOPIC-ADD ON - Abnormal; Notable for the following:    Squamous Epithelial / LPF FEW (*)    Bacteria, UA MANY (*)    All other components within normal limits  URINE CULTURE  BASIC METABOLIC PANEL  PRO B NATRIURETIC PEPTIDE  PREGNANCY, URINE  I-STAT TROPOININ, ED    Imaging Review Dg Chest 2 View  09/20/2013   CLINICAL DATA:  chest pain  EXAM: CHEST  2 VIEW  COMPARISON:  Chest radiograph 03/15/2013  FINDINGS: Normal mediastinum and cardiac silhouette. Normal pulmonary vasculature. No evidence of effusion, infiltrate, or pneumothorax. No acute bony abnormality.  IMPRESSION: Normal chest radiograph.   Electronically Signed   By: Genevive Bi M.D.   On: 09/20/2013 16:58     EKG Interpretation None       MDM   Final diagnoses:  UTI (lower urinary tract infection)  Headache above the eye region  Atypical chest pain    Filed Vitals:   09/20/13 1800 09/20/13 1830 09/20/13 1930 09/20/13 2013  BP: 97/52 108/58 99/51 119/57  Pulse: 49 51 48 51  Temp:      TempSrc:      Resp: 19 21 16 16   Height:      SpO2: 100% 100% 100% 100%    Medications  sodium chloride 0.9 % bolus 1,000 mL (0 mLs Intravenous Stopped 09/20/13 2018)  metoCLOPramide (REGLAN) injection 10 mg (10 mg Intravenous Given 09/20/13 1856)  diphenhydrAMINE (BENADRYL) injection 25 mg (25 mg Intravenous Given 09/20/13 1852)  dexamethasone (DECADRON) injection 10 mg (10 mg Intravenous Given 09/20/13 1855)  cefTRIAXone (ROCEPHIN) 1 g in dextrose 5 % 50 mL IVPB (0 g Intravenous Stopped 09/20/13 2018)    2019  Dominique [name] is a 38 y.o. female presenting with multiple complaints of chest pain, headache and abdominal pain. Neuro exam is nonfocal. Headache is not atypical for her, doubt subarachnoid. Serial abdominal exams remained benign. Nonspecific elevation in lipase. Patient is tolerating by mouth's. EKG is nonischemic, troponin is negative. Patient is low risk by Wells criteria and PERC negative.  UTI and frequency.   Evaluation does not show pathology that would require ongoing emergent intervention or inpatient treatment. Pt is hemodynamically stable and mentating appropriately. Discussed findings and plan with patient/guardian, who agrees with care plan. All questions answered. Return precautions discussed and outpatient follow up given.   Discharge Medication List as of 09/20/2013  7:57 PM    START taking these medications   Details  butalbital-acetaminophen-caffeine (FIORICET) 50-325-40 MG per tablet Take 1-2 tablets by mouth every 6 (six) hours as needed for headache., Starting 09/20/2013, Until Thu 09/20/14, Print    cephALEXin (KEFLEX) 500 MG capsule Take 1 capsule (500 mg total) by mouth 2 (two) times daily., Starting  09/20/2013, Until Discontinued, [employer], PA-C 09/21/13 [phone]

## 2013-09-20 NOTE — ED Notes (Signed)
Pt returned from xray. Apologized to pt for wait time. PT in NAD AO x4. Family sitting with pt.

## 2013-09-20 NOTE — ED Notes (Addendum)
Pt with multiple complaints. Reports 10/10 HA x 1 week that is progressively worsening. Denies blurred vision/double vision. Pt also c/o central chest pain x 2 days that comes and goes. Pt reports diaphoresis, SOB, N/V. Pt also c/o peri-umbilical pain x 1 week. Pt in NAD. Speaks in complete sentences. Denies urinary symptoms.

## 2013-09-20 NOTE — Discharge Instructions (Signed)
Please follow with your primary care doctor in the next 2 days for a check-up. They must obtain records for further management.   Do not hesitate to return to the Emergency Department for any new, worsening or concerning symptoms.    Headaches, Frequently Asked Questions MIGRAINE HEADACHES Q: What is migraine? What causes it? How can I treat it? A: Generally, migraine headaches begin as a dull ache. Then they develop into a constant, throbbing, and pulsating pain. You may experience pain at the temples. You may experience pain at the front or back of one or both sides of the head. The pain is usually accompanied by a combination of:  Nausea.  Vomiting.  Sensitivity to light and noise. Some people (about 15%) experience an aura (see below) before an attack. The cause of migraine is believed to be chemical reactions in the brain. Treatment for migraine may include over-the-counter or prescription medications. It may also include self-help techniques. These include relaxation training and biofeedback.  Q: What is an aura? A: About 15% of people with migraine get an "aura". This is a sign of neurological symptoms that occur before a migraine headache. You may see wavy or jagged lines, dots, or flashing lights. You might experience tunnel vision or blind spots in one or both eyes. The aura can include visual or auditory hallucinations (something imagined). It may include disruptions in smell (such as strange odors), taste or touch. Other symptoms include:  Numbness.  A "pins and needles" sensation.  Difficulty in recalling or speaking the correct word. These neurological events may last as long as 60 minutes. These symptoms will fade as the headache begins. Q: What is a trigger? A: Certain physical or environmental factors can lead to or "trigger" a migraine. These include:  Foods.  Hormonal changes.  Weather.  Stress. It is important to remember that triggers are different for  everyone. To help prevent migraine attacks, you need to figure out which triggers affect you. Keep a headache diary. This is a good way to track triggers. The diary will help you talk to your healthcare professional about your condition. Q: Does weather affect migraines? A: Bright sunshine, hot, humid conditions, and drastic changes in barometric pressure may lead to, or "trigger," a migraine attack in some people. But studies have shown that weather does not act as a trigger for everyone with migraines. Q: What is the link between migraine and hormones? A: Hormones start and regulate many of your body's functions. Hormones keep your body in balance within a constantly changing environment. The levels of hormones in your body are unbalanced at times. Examples are during menstruation, pregnancy, or menopause. That can lead to a migraine attack. In fact, about three quarters of all women with migraine report that their attacks are related to the menstrual cycle.  Q: Is there an increased risk of stroke for migraine sufferers? A: The likelihood of a migraine attack causing a stroke is very remote. That is not to say that migraine sufferers cannot have a stroke associated with their migraines. In persons under age 39, the most common associated factor for stroke is migraine headache. But over the course of a person's normal life span, the occurrence of migraine headache may actually be associated with a reduced risk of dying from cerebrovascular disease due to stroke.  Q: What are acute medications for migraine? A: Acute medications are used to treat the pain of the headache after it has started. Examples over-the-counter medications, NSAIDs, ergots, and triptans.  Q: What are the triptans? °A: Triptans are the newest class of abortive medications. They are specifically targeted to treat migraine. Triptans are vasoconstrictors. They moderate some chemical reactions in the brain. The triptans work on receptors  in your brain. Triptans help to restore the balance of a neurotransmitter called serotonin. Fluctuations in levels of serotonin are thought to be a main cause of migraine.  °Q: Are over-the-counter medications for migraine effective? °A: Over-the-counter, or "OTC," medications may be effective in relieving mild to moderate pain and associated symptoms of migraine. But you should see your caregiver before beginning any treatment regimen for migraine.  °Q: What are preventive medications for migraine? °A: Preventive medications for migraine are sometimes referred to as "prophylactic" treatments. They are used to reduce the frequency, severity, and length of migraine attacks. Examples of preventive medications include antiepileptic medications, antidepressants, beta-blockers, calcium channel blockers, and NSAIDs (nonsteroidal anti-inflammatory drugs). °Q: Why are anticonvulsants used to treat migraine? °A: During the past few years, there has been an increased interest in antiepileptic drugs for the prevention of migraine. They are sometimes referred to as "anticonvulsants". Both epilepsy and migraine may be caused by similar reactions in the brain.  °Q: Why are antidepressants used to treat migraine? °A: Antidepressants are typically used to treat people with depression. They may reduce migraine frequency by regulating chemical levels, such as serotonin, in the brain.  °Q: What alternative therapies are used to treat migraine? °A: The term "alternative therapies" is often used to describe treatments considered outside the scope of conventional Western medicine. Examples of alternative therapy include acupuncture, acupressure, and yoga. Another common alternative treatment is herbal therapy. Some herbs are believed to relieve headache pain. Always discuss alternative therapies with your caregiver before proceeding. Some herbal products contain arsenic and other toxins. °TENSION HEADACHES °Q: What is a tension-type  headache? What causes it? How can I treat it? °A: Tension-type headaches occur randomly. They are often the result of temporary stress, anxiety, fatigue, or anger. Symptoms include soreness in your temples, a tightening band-like sensation around your head (a "vice-like" ache). Symptoms can also include a pulling feeling, pressure sensations, and contracting head and neck muscles. The headache begins in your forehead, temples, or the back of your head and neck. Treatment for tension-type headache may include over-the-counter or prescription medications. Treatment may also include self-help techniques such as relaxation training and biofeedback. °CLUSTER HEADACHES °Q: What is a cluster headache? What causes it? How can I treat it? °A: Cluster headache gets its name because the attacks come in groups. The pain arrives with little, if any, warning. It is usually on one side of the head. A tearing or bloodshot eye and a runny nose on the same side of the headache may also accompany the pain. Cluster headaches are believed to be caused by chemical reactions in the brain. They have been described as the most severe and intense of any headache type. Treatment for cluster headache includes prescription medication and oxygen. °SINUS HEADACHES °Q: What is a sinus headache? What causes it? How can I treat it? °A: When a cavity in the bones of the face and skull (a sinus) becomes inflamed, the inflammation will cause localized pain. This condition is usually the result of an allergic reaction, a tumor, or an infection. If your headache is caused by a sinus blockage, such as an infection, you will probably have a fever. An x-ray will confirm a sinus blockage. Your caregiver's treatment might include antibiotics for the infection,   as well as antihistamines or decongestants.  REBOUND HEADACHES Q: What is a rebound headache? What causes it? How can I treat it? A: A pattern of taking acute headache medications too often can lead  to a condition known as "rebound headache." A pattern of taking too much headache medication includes taking it more than 2 days per week or in excessive amounts. That means more than the label or a caregiver advises. With rebound headaches, your medications not only stop relieving pain, they actually begin to cause headaches. Doctors treat rebound headache by tapering the medication that is being overused. Sometimes your caregiver will gradually substitute a different type of treatment or medication. Stopping may be a challenge. Regularly overusing a medication increases the potential for serious side effects. Consult a caregiver if you regularly use headache medications more than 2 days per week or more than the label advises. ADDITIONAL QUESTIONS AND ANSWERS Q: What is biofeedback? A: Biofeedback is a self-help treatment. Biofeedback uses special equipment to monitor your body's involuntary physical responses. Biofeedback monitors:  Breathing.  Pulse.  Heart rate.  Temperature.  Muscle tension.  Brain activity. Biofeedback helps you refine and perfect your relaxation exercises. You learn to control the physical responses that are related to stress. Once the technique has been mastered, you do not need the equipment any more. Q: Are headaches hereditary? A: Four out of five (80%) of people that suffer report a family history of migraine. Scientists are not sure if this is genetic or a family predisposition. Despite the uncertainty, a child has a 50% chance of having migraine if one parent suffers. The child has a 75% chance if both parents suffer.  Q: Can children get headaches? A: By the time they reach high school, most young people have experienced some type of headache. Many safe and effective approaches or medications can prevent a headache from occurring or stop it after it has begun.  Q: What type of doctor should I see to diagnose and treat my headache? A: Start with your primary  caregiver. Discuss his or her experience and approach to headaches. Discuss methods of classification, diagnosis, and treatment. Your caregiver may decide to recommend you to a headache specialist, depending upon your symptoms or other physical conditions. Having diabetes, allergies, etc., may require a more comprehensive and inclusive approach to your headache. The National Headache Foundation will provide, upon request, a list of [facility] physician members in your state. Document Released: 06/06/2003 Document Revised: 06/08/2011 Document Reviewed: 11/14/2007 [facility] Patient Information 2015 [place], [place]. This information is not intended to replace advice given to you by your health care provider. Make sure you discuss any questions you have with your health care provider.

## 2013-09-20 NOTE — ED Notes (Signed)
Clifton Custard, PA-student at the bedside.

## 2013-09-22 NOTE — ED Provider Notes (Signed)
Medical screening examination/treatment/procedure(s) were performed by non-physician practitioner and as supervising physician I was immediately available for consultation/collaboration.   EKG Interpretation None      Devoria Albe, MD, Armando Gang   Ward Givens, MD 09/22/13 [phone]

## 2013-09-23 LAB — URINE CULTURE

## 2013-09-24 ENCOUNTER — Telehealth (HOSPITAL_BASED_OUTPATIENT_CLINIC_OR_DEPARTMENT_OTHER): Payer: Self-pay | Admitting: Emergency Medicine

## 2013-09-24 NOTE — Progress Notes (Signed)
ED Antimicrobial Stewardship Positive Culture Follow Up   Dominique [name] is an 38 y.o. female who presented to [facility] on 09/20/2013 with a chief complaint of  Chief Complaint  Patient presents with  . Chest Pain  . Headache  . Emesis    Recent Results (from the past 720 hour(s))  URINE CULTURE     Status: None   Collection Time    09/20/13  5:31 PM      Result Value Ref Range Status   Specimen Description URINE, RANDOM   Final   Special Requests NONE   Final   Culture  Setup Time     Final   Value: 09/21/2013 09:27     Performed at [facility] Count     Final   Value: >=100,000 COLONIES/ML     Performed at [facility]   Culture     Final   Value: ESCHERICHIA COLI     Performed at [facility]   Report Status 09/23/2013 FINAL   Final   Organism ID, Bacteria ESCHERICHIA COLI   Final    [x]  Treated with Keflex, organism resistant to prescribed antimicrobial []  Patient discharged originally without antimicrobial agent and treatment is now indicated  New antibiotic prescription: Ciprofloxacin 500mg  PO BID x 3 days  ED Provider: , PA-C   09/24/2013, 10:23 PM Infectious Diseases Pharmacist Phone# [phone]

## 2013-09-24 NOTE — Telephone Encounter (Signed)
Post ED Visit - Positive Culture Follow-up: Successful Patient Follow-Up  Culture assessed and recommendations reviewed by: [x]  Wes Dulaney, Pharm.D., BCPS []  , Pharm.D., BCPS []  , Celedonio Miyamoto.D., BCPS []  [place], Georgina Pillion.D., BCPS, AAHIVP []  [address], Pharm.D., BCPS, AAHIVP  Positive Urine culture  []  Patient discharged without antimicrobial prescription and treatment is now indicated [x]  Organism is resistant to prescribed ED discharge antimicrobial []  Patient with positive blood cultures  Changes discussed with ED provider: New antibiotic prescription Cipro 500 mg PO BID x three days   (stop Kelfex)  09/24/13 @ 1640 left voicemail to call flow managers' #   [address] 09/24/2013, 4:43 PM

## 2013-09-24 NOTE — Telephone Encounter (Signed)
ID verified, Pt notified of + urine culture and need to change antibiotic. Instructed to stop Keflex and starte Cipro. RX Cipro called to [facility] [phone] voicemail.

## 2013-10-24 ENCOUNTER — Ambulatory Visit: Payer: Medicaid Other

## 2013-10-24 ENCOUNTER — Ambulatory Visit (INDEPENDENT_AMBULATORY_CARE_PROVIDER_SITE_OTHER): Payer: Medicaid Other | Admitting: *Deleted

## 2013-10-24 VITALS — BP 99/65 | HR 74 | Temp 98.3°F | Wt 117.0 lb

## 2013-10-24 DIAGNOSIS — Z3049 Encounter for surveillance of other contraceptives: Secondary | ICD-10-CM

## 2013-10-24 DIAGNOSIS — Z308 Encounter for other contraceptive management: Secondary | ICD-10-CM

## 2013-10-24 NOTE — Progress Notes (Signed)
Pt is in office for depo injection.  Pt is on time for her injection.  Depo given in left upper outer quadrant.  Pt tolerated well.  Pt has no other concerns today.  Pt advised to RTO on 01/15/14 for next injection.  Pt states understanding.    BP 99/65  Pulse 74  Temp(Src) 98.3 F (36.8 C)  Wt 117 lb (53.071 kg)

## 2014-01-10 ENCOUNTER — Telehealth: Payer: Self-pay | Admitting: Obstetrics

## 2014-01-11 NOTE — Telephone Encounter (Signed)
TASK COMPLETED

## 2014-01-15 ENCOUNTER — Ambulatory Visit: Payer: Medicaid Other

## 2014-01-19 ENCOUNTER — Encounter (HOSPITAL_COMMUNITY): Payer: Self-pay | Admitting: Emergency Medicine

## 2014-01-19 ENCOUNTER — Emergency Department (INDEPENDENT_AMBULATORY_CARE_PROVIDER_SITE_OTHER)
Admission: EM | Admit: 2014-01-19 | Discharge: 2014-01-19 | Disposition: A | Discharge status: Home / Self Care | Payer: Self-pay | Source: Home / Self Care | Attending: Family Medicine | Admitting: Family Medicine

## 2014-01-19 DIAGNOSIS — L089 Local infection of the skin and subcutaneous tissue, unspecified: Secondary | ICD-10-CM

## 2014-01-19 DIAGNOSIS — L723 Sebaceous cyst: Secondary | ICD-10-CM

## 2014-01-19 MED ORDER — LIDOCAINE-EPINEPHRINE (PF) 2 %-1:200000 IJ SOLN
INTRAMUSCULAR | Status: AC
  Filled 2014-01-19: qty 20

## 2014-01-19 NOTE — ED Provider Notes (Signed)
CSN: [id number]     Arrival date & time 01/19/14  5027 History   First MD Initiated Contact with Patient 01/19/14 1002     Chief Complaint  Patient presents with  . Recurrent Skin Infections   (Consider location/radiation/quality/duration/timing/severity/associated sxs/prior Treatment) HPI     38 year old female presents complaining of abscess in her right axilla. This is been present for about one week, getting larger and more painful. The pain is now severe. She has had this before that have drained on their own.  No systemic sxs   Past Medical History  Diagnosis Date  . Kidney stones    Past Surgical History  Procedure Laterality Date  . Cesarean section    . Hernia repair     Family History  Problem Relation Age of Onset  . Hypertension Father   . Diabetes Father    History  Substance Use Topics  . Smoking status: Current Every Day Smoker -- 0.50 packs/day    Types: Cigarettes  . Smokeless tobacco: Never Used  . Alcohol Use: 1.0 oz/week    2 drink(s) per week     Comment: occasional   OB History   Grav Para Term Preterm Abortions TAB SAB Ect Mult Living   3 3 3  0 0 0 0 0 0 3     Review of Systems  Skin:       Abscess right axilla  All other systems reviewed and are negative.   Allergies  Review of patient's allergies indicates no known allergies.  Home Medications   Prior to Admission medications   Medication Sig Start Date End Date Taking? Authorizing Provider  acetaminophen (TYLENOL) 500 MG tablet Take 500 mg by mouth every 6 (six) hours as needed (for pain).     Historical Provider, MD  butalbital-acetaminophen-caffeine (FIORICET) [phone] MG per tablet Take 1-2 tablets by mouth every 6 (six) hours as needed for headache. 09/20/13 09/20/14  09/22/14 [place], PA-C  ibuprofen (ADVIL,MOTRIN) 800 MG tablet Take 1 tablet (800 mg total) by mouth every 8 (eight) hours as needed. 05/09/13   07/07/13, MD  medroxyPROGESTERone (DEPO-PROVERA) 150 MG/ML  injection Inject 1 mL (150 mg total) into the muscle every 3 (three) months. 07/31/13   09/30/13, MD  topiramate (TOPAMAX) 25 MG tablet Take 25 mg by mouth daily.    Historical Provider, MD   BP 112/70  Pulse 68  Temp(Src) 98.2 F (36.8 C) (Oral)  Resp 15  SpO2 99%  LMP 01/18/2014 Physical Exam  Nursing note and vitals reviewed. Constitutional: She is oriented to person, place, and time. Vital signs are normal. She appears well-developed and well-nourished. No distress.  HENT:  Head: Normocephalic and atraumatic.  Pulmonary/Chest: Effort normal. No respiratory distress.  Neurological: She is alert and oriented to person, place, and time. She has normal strength. Coordination normal.  Skin: Skin is warm and dry. No rash noted. She is not diaphoretic.  Large tender swollen abscess in the right axilla with no surrounding erythema or induration.  Psychiatric: She has a normal mood and affect. Judgment normal.    ED Course  INCISION AND DRAINAGE Date/Time: 01/19/2014 11:11 AM Performed by: 01/21/2014, H Authorized by: Autumn Messing Consent: Verbal consent obtained. Risks and benefits: risks, benefits and alternatives were discussed Consent given by: patient Patient identity confirmed: verbally with patient Time out: Immediately prior to procedure a "time out" was called to verify the correct patient, procedure, equipment, support staff and site/side marked as required. Type:  cyst Body area: upper extremity (Right axilla) Anesthesia: local infiltration Local anesthetic: lidocaine 2% with epinephrine Anesthetic total: 8 ml Patient sedated: no Scalpel size: 11 Incision type: single straight Complexity: complex Drainage: purulent (Thick, malodorous) Drainage amount: moderate Wound treatment: wound left open Packing material: 1/4 in iodoform gauze Patient tolerance: Patient tolerated the procedure well with no immediate complications.   (including critical care  time) Labs Review Labs Reviewed  CULTURE, ROUTINE-ABSCESS    Imaging Review No results found.   MDM   1. Sebaceous cyst of right axilla   2. Infected sebaceous cyst    This is more consistent with an infected sebaceous cyst. Drained, an attempt was made to remove this cyst but it is too deep within the axilla. Refer to Gen. surgery for management. No antibiotics indicated at this time, should resolve the infection with the incision and drainage.    Graylon Good, PA-C 01/19/14 1114

## 2014-01-19 NOTE — Discharge Instructions (Signed)
Epidermal Cyst An epidermal cyst is sometimes called a sebaceous cyst, epidermal inclusion cyst, or infundibular cyst. These cysts usually contain a substance that looks "pasty" or "cheesy" and may have a bad smell. This substance is a protein called keratin. Epidermal cysts are usually found on the face, neck, or trunk. They may also occur in the vaginal area or other parts of the genitalia of both men and women. Epidermal cysts are usually small, painless, slow-growing bumps or lumps that move freely under the skin. It is important not to try to pop them. This may cause an infection and lead to tenderness and swelling. CAUSES  Epidermal cysts may be caused by a deep penetrating injury to the skin or a plugged hair follicle, often associated with acne. SYMPTOMS  Epidermal cysts can become inflamed and cause:  Redness.  Tenderness.  Increased temperature of the skin over the bumps or lumps.  Grayish-white, bad smelling material that drains from the bump or lump. DIAGNOSIS  Epidermal cysts are easily diagnosed by your caregiver during an exam. Rarely, a tissue sample (biopsy) may be taken to rule out other conditions that may resemble epidermal cysts. TREATMENT   Epidermal cysts often get better and disappear on their own. They are rarely ever cancerous.  If a cyst becomes infected, it may become inflamed and tender. This may require opening and draining the cyst. Treatment with antibiotics may be necessary. When the infection is gone, the cyst may be removed with minor surgery.  Small, inflamed cysts can often be treated with antibiotics or by injecting steroid medicines.  Sometimes, epidermal cysts become large and bothersome. If this happens, surgical removal in your caregiver's office may be necessary. HOME CARE INSTRUCTIONS  Only take over-the-counter or prescription medicines as directed by your caregiver.  Take your antibiotics as directed. Finish them even if you start to feel  better. SEEK MEDICAL CARE IF:   Your cyst becomes tender, red, or swollen.  Your condition is not improving or is getting worse.  You have any other questions or concerns. MAKE SURE YOU:  Understand these instructions.  Will watch your condition.  Will get help right away if you are not doing well or get worse. Document Released: 02/15/2004 Document Revised: 06/08/2011 Document Reviewed: 09/22/2010 [employer] Patient Information 2015 [employer]. This information is not intended to replace advice given to you by your health care provider. Make sure you discuss any questions you have with your health care provider.  

## 2014-01-19 NOTE — ED Notes (Signed)
38 year old female with C/0 a large very tender abscess  under R arm.  States noticed it about 1 week ago.  States she has had them before but they just drain on their own.

## 2014-01-22 ENCOUNTER — Emergency Department (HOSPITAL_COMMUNITY)
Admission: EM | Admit: 2014-01-22 | Discharge: 2014-01-22 | Disposition: A | Discharge status: Home / Self Care | Payer: Medicaid Other | Attending: Emergency Medicine | Admitting: Emergency Medicine

## 2014-01-22 ENCOUNTER — Encounter (HOSPITAL_COMMUNITY): Payer: Self-pay | Admitting: Emergency Medicine

## 2014-01-22 DIAGNOSIS — Z720 Tobacco use: Secondary | ICD-10-CM | POA: Insufficient documentation

## 2014-01-22 DIAGNOSIS — L03111 Cellulitis of right axilla: Secondary | ICD-10-CM | POA: Insufficient documentation

## 2014-01-22 DIAGNOSIS — Z87442 Personal history of urinary calculi: Secondary | ICD-10-CM | POA: Insufficient documentation

## 2014-01-22 DIAGNOSIS — L723 Sebaceous cyst: Secondary | ICD-10-CM | POA: Insufficient documentation

## 2014-01-22 MED ORDER — DOXYCYCLINE HYCLATE 100 MG PO CAPS: 100.0000 mg | ORAL_CAPSULE | Freq: Two times a day (BID) | ORAL | Status: DC

## 2014-01-22 NOTE — Discharge Instructions (Signed)
Return to the emergency room with worsening of symptoms, new symptoms or with symptoms that are concerning, especially fevers, chills, nausea, vomiting. Call to make appointment with general surgery. Call every day like you have been until you reach someone.  Please take all of your antibiotics until finished!   You may develop abdominal discomfort or diarrhea from the antibiotic.  You may help offset this with probiotics which you can buy or get in yogurt. Do not eat  or take the probiotics until 2 hours after your antibiotic.    Excision of Skin Lesions Excision of a skin lesion refers to the removal of a section of skin by making small cuts (incisions) in the skin. This is typically done to remove a cancerous growth (basal cell carcinoma, squamous cell carcinoma, or melanoma) or a noncancerous growth (cyst). It may be done to treat or prevent cancer or infection. It may also be done to improve cosmetic appearance (removal of mole, skin tag). LET YOUR CAREGIVER KNOW ABOUT:   Allergies to food or medicine.  Medicines taken, including vitamins, herbs, eyedrops, over-the-counter medicines, and creams.  Use of steroids (by mouth or creams).  Previous problems with anesthetics or numbing medicines.  History of bleeding problems or blood clots.  History of any prostheses.  Previous surgery.  Other health problems, including diabetes and kidney problems.  Possibility of pregnancy, if this applies. RISKS AND COMPLICATIONS  Many complications can be managed. With appropriate treatment and rehabilitation, the following complications are very uncommon:  Bleeding.  Infection.  Scarring.  Recurrence of cyst or cancer.  Changes in skin sensation or appearance (discoloration, swelling).  Reaction to anesthesia.  Allergic reaction to surgical materials or ointments.  Damage to nerves, blood vessels, muscles, or other structures.  Continued pain. BEFORE THE PROCEDURE  It is important  to follow your caregiver's instructions prior to your procedure to avoid complications. Steps before your procedure may include:  Physical exam, blood tests, other procedures, such as removing a small sample for examination under a microscope (biopsy).  Your caregiver may review the procedure, the anesthesia being used, and what to expect after the procedure with you. You may be asked to:  Stop taking certain medicines, such as blood thinners (including aspirin, clopidogrel, ibuprofen), for several days prior to your procedure.  Take certain medicines.  Stop smoking. It is a good idea to arrange for a ride home after surgery and to have someone to help you with activities during recovery. PROCEDURE  There are several excision techniques. The type of excision or surgical technique used will depend on your condition, the location of the lesion, and your overall health. After the lesion is sterilized and a local anesthetic is applied, the following may be performed: Complete surgical excision The area to be removed is marked with a pen. Using a small scalpel and scissors, the surgeon gently cuts around and under the lesion until it is completely removed. The lesion is placed in a special fluid and sent to the lab for examination. If necessary, bleeding will be controlled with a device that delivers heat. The edges of the wound are stitched together and a dressing is applied. This procedure may be performed to treat a cancerous growth or noncancerous cyst or lesion. Surgeons commonly perform an elliptical excision, to minimize scarring. Excision of a cyst The surgeon makes an incision on the cyst. The entire cyst is removed through the incision. The wound may be closed with a suture (stitch). Shave excision During shave  excision, the surgeon uses a small blade or loop instrument to shave off the lesion. This may be done to remove a mole or skin tag. The wound is usually left to heal on its own  without stitches. Punch excision During punch excision, the surgeon uses a small, round tool (like a cookie cutter) to cut a circle shape out of the skin. The outer edges of the skin are stitched together. This may be done to remove a mole or scar or to perform a biopsy of the lesion. Mohs micrographic surgery During Mohs micrographic surgery, layers of the lesion are removed with a scalpel or loop instrument and immediately examined under a microscope until all of the abnormal or cancerous tissue is removed. This procedure is minimally invasive and ensures the best cosmetic outcome, with removal of as little normal tissue as possible. Mohs is usually done to treat skin cancer, such as basal cell carcinoma or squamous cell carcinoma, particularly on the face and ears. Antibiotic ointment is applied to the surgical area after each of the procedures listed above, as necessary. AFTER THE PROCEDURE  How well you heal depends on many factors. Most patients heal quite well with proper techniques and self-care. Scarring will lessen over time. HOME CARE INSTRUCTIONS   Take medicines for pain as directed.  Keep the incision area clean, dry, and protected for at least 48 hours. Change dressings as directed.  For bleeding, apply gentle but firm pressure to the wound using a folded towel for 20 minutes. Call your caregiver if bleeding does not stop.  Avoid high-impact exercise and activities until the stitches are removed or the area heals.  Follow your caregiver's instructions to minimize scarring. Avoid sun exposure until the area has healed. Scarring should lessen over time.  Follow up with your caregiver as directed. Removal of stitches within 4 to 14 days may be necessary. Finding out the results of your test Not all test results are available during your visit. If your test results are not back during the visit, make an appointment with your caregiver to find out the results. Do not assume everything  is normal if you have not heard from your caregiver or the medical facility. It is important for you to follow up on all of your test results. SEEK MEDICAL CARE IF:   You or your child has an oral temperature above 102 F (38.9 C).  You develop signs of infection (chills, feeling unwell).  You notice bleeding, pain, discharge, redness, or swelling at the incision site.  You notice skin irregularities or changes in sensation. MAKE SURE YOU:   Understand these instructions.  Will watch your condition.  Will get help right away if you are not doing well or get worse. FOR MORE INFORMATION  American Academy [employer]: www.https://powers.com/ American Academy of Dermatology: InfoExam.si Document Released: 06/10/2009 Document Revised: 06/08/2011 Document Reviewed: 06/10/2009 [facility] Patient Information 2015 [place], [place]. This information is not intended to replace advice given to you by your health care provider. Make sure you discuss any questions you have with your health care provider.

## 2014-01-22 NOTE — ED Notes (Signed)
Pt seen by EDPA prior to RN assessment, see PA notes, orders received and initiated, R axillary abscess noted, iodaform trimmed, gauze dressing applied, Rx x1 given, "feels better", alert, NAD, calm, interactive, denies questions, out with mother.

## 2014-01-22 NOTE — ED Provider Notes (Signed)
CSN: [id number]     Arrival date & time 01/22/14  1836 History  This chart was scribed for Oswaldo Conroy, PA-C working with Arby Barrette, MD by Evon Slack, ED Scribe. This patient was seen in room TR08C/TR08C and the patient's care was started at 8:53 PM.      Chief Complaint  Patient presents with  . Abscess    Patient is a 38 y.o. female presenting with abscess. The history is provided by the patient. No language interpreter was used.  Abscess Associated symptoms: no fever    HPI Comments: EMIE SOMMERFELD is a 38 y.o. female who presents to the Emergency Department complaining of gradually worsening abscess on right axilla onset 4 days ago. She describes it as a stabbing pain. She state she recently had the abscess I&D here in the ED 01/19/14 and was told she had a sebaceous cyst and should follow up with general surgery. States she has called general surgery every day and unable to get in touch with them. She states she has been taking ibuprofen with no relief. She denies fever, chills or other related symptoms      Past Medical History  Diagnosis Date  . Kidney stones    Past Surgical History  Procedure Laterality Date  . Cesarean section    . Hernia repair     Family History  Problem Relation Age of Onset  . Hypertension Father   . Diabetes Father    History  Substance Use Topics  . Smoking status: Current Every Day Smoker -- 0.50 packs/day    Types: Cigarettes  . Smokeless tobacco: Never Used  . Alcohol Use: 1.0 oz/week    2 drink(s) per week     Comment: occasional   OB History   Grav Para Term Preterm Abortions TAB SAB Ect Mult Living   3 3 3  0 0 0 0 0 0 3     Review of Systems  Constitutional: Negative for fever and chills.  Skin: Positive for wound.  All other systems reviewed and are negative.   Allergies  Review of patient's allergies indicates no known allergies.  Home Medications   Prior to Admission medications   Medication Sig Start  Date End Date Taking? Authorizing Provider  doxycycline (VIBRAMYCIN) 100 MG capsule Take 1 capsule (100 mg total) by mouth 2 (two) times daily. 01/22/14   01/24/14, PA-C   Triage Vitals: BP 105/73  Pulse 69  Temp(Src) 99.3 F (37.4 C) (Oral)  Resp 14  Ht 5' (1.524 m)  Wt 122 lb (55.339 kg)  BMI 23.83 kg/m2  SpO2 100%  LMP 01/18/2014  Physical Exam  Nursing note and vitals reviewed. Constitutional: She appears well-developed and well-nourished. No distress.  HENT:  Head: Normocephalic and atraumatic.  Eyes: Conjunctivae are normal. Right eye exhibits no discharge. Left eye exhibits no discharge.  Cardiovascular:  Pulses:      Radial pulses are 2+ on the right side, and 2+ on the left side.  Pulmonary/Chest: Effort normal. No respiratory distress.  Neurological: She is alert. Coordination normal.  5/5 strength in upper extremities.   Skin: She is not diaphoretic.  1-2 cm abscess with packing in place, small amt purulent discharge, mild signs of surrounding cellulitis erythema and induration, Tenderness to palpation, capillary refill less than 3 seconds.     ED Course  Procedures (including critical care time) DIAGNOSTIC STUDIES: Oxygen Saturation is 100% on RA, normal by my interpretation.    COORDINATION OF CARE: 8:59  PM-Discussed treatment plan which includes antibiotics with pt at bedside and pt agreed to plan.     Labs Review Labs Reviewed - No data to display  Imaging Review No results found.   EKG Interpretation None     Meds given in ED:  Medications - No data to display  Discharge Medication List as of 01/22/2014  9:06 PM    START taking these medications   Details  doxycycline (VIBRAMYCIN) 100 MG capsule Take 1 capsule (100 mg total) by mouth 2 (two) times daily., Starting 01/22/2014, Until Discontinued, Print          MDM   Final diagnoses:  Sebaceous cyst   Patient with recent I&D of presenting with persistent, worsening pain.  Denies fever and chills. VSS. Well-appearing abscess with packing in place. After removal of some of the packing patient felt much better. Patient with mild signs of cellulitis with purulent discharge. Will treat with antibiotics. Patient urged to follow-up with general surgery. Discussed the recurrent nature of sebaceous cyst without surgical removal. Patient is afebrile, nontoxic, and in no acute distress. Patient is appropriate for outpatient management and is stable for discharge.  Discussed return precautions with patient. Discussed all results and patient verbalizes understanding and agrees with plan.  I personally performed the services described in this documentation, which was scribed in my presence. The recorded information has been reviewed and is accurate.      Louann Sjogren, PA-C 01/23/14 0210

## 2014-01-22 NOTE — ED Notes (Signed)
Pt. Reports abscess at right axilla with pain and drainage onset today , seen here 3 days ago diagnosed with sebaceous cyst and was discharged home . Denies fever or chills.

## 2014-01-23 LAB — CULTURE, ROUTINE-ABSCESS: Gram Stain: NONE SEEN

## 2014-01-23 NOTE — ED Provider Notes (Signed)
Medical screening examination/treatment/procedure(s) were performed by non-physician practitioner and as supervising physician I was immediately available for consultation/collaboration.   EKG Interpretation None       Arby Barrette, MD 01/23/14 2340

## 2014-01-27 NOTE — ED Provider Notes (Signed)
Medical screening examination/treatment/procedure(s) were performed by a resident physician or non-physician practitioner and as the supervising physician I was immediately available for consultation/collaboration.  David Merrell, MD Family Medicine   David J Merrell, MD 01/27/14 0352 

## 2014-01-29 ENCOUNTER — Encounter (HOSPITAL_COMMUNITY): Payer: Self-pay | Admitting: Emergency Medicine

## 2014-08-01 ENCOUNTER — Encounter (HOSPITAL_COMMUNITY): Payer: Self-pay | Admitting: *Deleted

## 2014-08-01 ENCOUNTER — Emergency Department (HOSPITAL_COMMUNITY)
Admission: EM | Admit: 2014-08-01 | Discharge: 2014-08-01 | Disposition: A | Discharge status: Home / Self Care | Payer: Medicaid Other | Attending: Emergency Medicine | Admitting: Emergency Medicine

## 2014-08-01 ENCOUNTER — Emergency Department (HOSPITAL_COMMUNITY): Payer: Medicaid Other

## 2014-08-01 DIAGNOSIS — R059 Cough, unspecified: Secondary | ICD-10-CM

## 2014-08-01 DIAGNOSIS — R05 Cough: Secondary | ICD-10-CM | POA: Insufficient documentation

## 2014-08-01 DIAGNOSIS — Z3202 Encounter for pregnancy test, result negative: Secondary | ICD-10-CM | POA: Insufficient documentation

## 2014-08-01 DIAGNOSIS — Z87442 Personal history of urinary calculi: Secondary | ICD-10-CM | POA: Insufficient documentation

## 2014-08-01 DIAGNOSIS — Z720 Tobacco use: Secondary | ICD-10-CM | POA: Insufficient documentation

## 2014-08-01 DIAGNOSIS — N390 Urinary tract infection, site not specified: Secondary | ICD-10-CM | POA: Insufficient documentation

## 2014-08-01 LAB — COMPREHENSIVE METABOLIC PANEL
ALBUMIN: 3.3 g/dL — AB (ref 3.5–5.0)
ALT: 23 U/L (ref 14–54)
AST: 26 U/L (ref 15–41)
Alkaline Phosphatase: 86 U/L (ref 38–126)
Anion gap: 10 (ref 5–15)
BUN: <5 mg/dL — ABNORMAL LOW (ref 6–20)
CO2: 23 mmol/L (ref 22–32)
CREATININE: 0.76 mg/dL (ref 0.44–1.00)
Calcium: 8.9 mg/dL (ref 8.9–10.3)
Chloride: 98 mmol/L — ABNORMAL LOW (ref 101–111)
GFR calc Af Amer: >60 mL/min (ref >60)
GFR calc non Af Amer: >60 mL/min (ref >60)
Glucose, Bld: 117 mg/dL — ABNORMAL HIGH (ref 70–99)
Potassium: 3.1 mmol/L — ABNORMAL LOW (ref 3.5–5.1)
Sodium: 131 mmol/L — ABNORMAL LOW (ref 135–145)
Total Bilirubin: 0.6 mg/dL (ref 0.3–1.2)
Total Protein: 7.1 g/dL (ref 6.5–8.1)

## 2014-08-01 LAB — URINE MICROSCOPIC-ADD ON

## 2014-08-01 LAB — CBC WITH DIFFERENTIAL/PLATELET
Basophils Absolute: 0 10*3/uL (ref 0.0–0.1)
Basophils Relative: 0 % (ref 0–1)
EOS ABS: 0 10*3/uL (ref 0.0–0.7)
EOS PCT: 0 % (ref 0–5)
HCT: 32 % — ABNORMAL LOW (ref 36.0–46.0)
HEMOGLOBIN: 10.8 g/dL — AB (ref 12.0–15.0)
LYMPHS ABS: 1.6 10*3/uL (ref 0.7–4.0)
Lymphocytes Relative: 12 % (ref 12–46)
MCH: 29.1 pg (ref 26.0–34.0)
MCHC: 33.8 g/dL (ref 30.0–36.0)
MCV: 86.3 fL (ref 78.0–100.0)
Monocytes Absolute: 1.2 10*3/uL — ABNORMAL HIGH (ref 0.1–1.0)
Monocytes Relative: 9 % (ref 3–12)
Neutro Abs: 10.3 10*3/uL — ABNORMAL HIGH (ref 1.7–7.7)
Neutrophils Relative %: 79 % — ABNORMAL HIGH (ref 43–77)
Platelets: 187 10*3/uL (ref 150–400)
RBC: 3.71 MIL/uL — AB (ref 3.87–5.11)
RDW: 13.3 % (ref 11.5–15.5)
WBC: 13.1 10*3/uL — ABNORMAL HIGH (ref 4.0–10.5)

## 2014-08-01 LAB — URINALYSIS, ROUTINE W REFLEX MICROSCOPIC
Bilirubin Urine: NEGATIVE
GLUCOSE, UA: NEGATIVE mg/dL
Ketones, ur: 15 mg/dL — AB
Nitrite: NEGATIVE
PH: 6 (ref 5.0–8.0)
Protein, ur: 30 mg/dL — AB
Specific Gravity, Urine: 1.008 (ref 1.005–1.030)
Urobilinogen, UA: 1 mg/dL (ref 0.0–1.0)

## 2014-08-01 LAB — I-STAT CG4 LACTIC ACID, ED: Lactic Acid, Venous: 1.25 mmol/L (ref 0.5–2.0)

## 2014-08-01 LAB — POC URINE PREG, ED: Preg Test, Ur: NEGATIVE

## 2014-08-01 MED ORDER — POTASSIUM CHLORIDE CRYS ER 20 MEQ PO TBCR
40.0000 meq | EXTENDED_RELEASE_TABLET | Freq: Once | ORAL | Status: AC
  Administered 2014-08-01: 40 meq via ORAL
  Filled 2014-08-01: qty 2

## 2014-08-01 MED ORDER — HYDROCODONE-ACETAMINOPHEN 5-325 MG PO TABS: 1.0000 | ORAL_TABLET | Freq: Four times a day (QID) | ORAL | Status: DC | PRN

## 2014-08-01 MED ORDER — ACETAMINOPHEN 325 MG PO TABS
650.0000 mg | ORAL_TABLET | Freq: Four times a day (QID) | ORAL | Status: DC | PRN
  Administered 2014-08-01: 650 mg via ORAL
  Filled 2014-08-01: qty 2

## 2014-08-01 MED ORDER — ONDANSETRON 4 MG PO TBDP: ORAL_TABLET | ORAL | Status: DC

## 2014-08-01 MED ORDER — SODIUM CHLORIDE 0.9 % IV BOLUS (SEPSIS)
1000.0000 mL | Freq: Once | INTRAVENOUS | Status: AC
  Administered 2014-08-01: 1000 mL via INTRAVENOUS

## 2014-08-01 MED ORDER — HYDROMORPHONE HCL 1 MG/ML IJ SOLN
1.0000 mg | Freq: Once | INTRAMUSCULAR | Status: AC
  Administered 2014-08-01: 1 mg via INTRAVENOUS
  Filled 2014-08-01: qty 1

## 2014-08-01 MED ORDER — CEPHALEXIN 500 MG PO CAPS: 500.0000 mg | ORAL_CAPSULE | Freq: Four times a day (QID) | ORAL | Status: DC

## 2014-08-01 MED ORDER — ONDANSETRON HCL 4 MG/2ML IJ SOLN
4.0000 mg | Freq: Once | INTRAMUSCULAR | Status: AC
  Administered 2014-08-01: 4 mg via INTRAVENOUS
  Filled 2014-08-01: qty 2

## 2014-08-01 MED ORDER — DEXTROSE 5 % IV SOLN
1.0000 g | Freq: Once | INTRAVENOUS | Status: AC
  Administered 2014-08-01: 1 g via INTRAVENOUS
  Filled 2014-08-01: qty 10

## 2014-08-01 NOTE — ED Provider Notes (Signed)
CSN: [id number]     Arrival date & time 08/01/14  1823 History   First MD Initiated Contact with Patient 08/01/14 2016     Chief Complaint  Patient presents with  . Abdominal Pain  . Headache  . Fever  . Chills  . Cough  . Weakness     (Consider location/radiation/quality/duration/timing/severity/associated sxs/prior Treatment) Patient is a 39 y.o. female presenting with fever. The history is provided by the patient (the pt complains of a fever).  Fever Temp source:  Subjective Severity:  Moderate Onset quality:  Sudden Timing:  Constant Progression:  Waxing and waning Chronicity:  New Relieved by:  Nothing Associated symptoms: no chest pain, no congestion, no cough, no diarrhea, no headaches and no rash     Past Medical History  Diagnosis Date  . Kidney stones    Past Surgical History  Procedure Laterality Date  . Cesarean section    . Hernia repair     Family History  Problem Relation Age of Onset  . Hypertension Father   . Diabetes Father    History  Substance Use Topics  . Smoking status: Current Every Day Smoker -- 0.50 packs/day    Types: Cigarettes  . Smokeless tobacco: Never Used  . Alcohol Use: 1.0 oz/week    2 drink(s) per week     Comment: occasional   OB History    Gravida Para Term Preterm AB TAB SAB Ectopic Multiple Living   3 3 3  0 0 0 0 0 0 3     Review of Systems  Constitutional: Positive for fever. Negative for appetite change and fatigue.  HENT: Negative for congestion, ear discharge and sinus pressure.   Eyes: Negative for discharge.  Respiratory: Negative for cough.   Cardiovascular: Negative for chest pain.  Gastrointestinal: Negative for abdominal pain and diarrhea.  Genitourinary: Negative for frequency and hematuria.  Musculoskeletal: Negative for back pain.  Skin: Negative for rash.  Neurological: Negative for seizures and headaches.  Psychiatric/Behavioral: Negative for hallucinations.      Allergies  Review of  patient's allergies indicates no known allergies.  Home Medications   Prior to Admission medications   Medication Sig Start Date End Date Taking? Authorizing Provider  cephALEXin (KEFLEX) 500 MG capsule Take 1 capsule (500 mg total) by mouth 4 (four) times daily. 08/01/14   10/01/14, MD  doxycycline (VIBRAMYCIN) 100 MG capsule Take 1 capsule (100 mg total) by mouth 2 (two) times daily. Patient not taking: Reported on 08/01/2014 01/22/14   01/24/14, PA-C  HYDROcodone-acetaminophen (NORCO/VICODIN) 5-325 MG per tablet Take 1 tablet by mouth every 6 (six) hours as needed for moderate pain. 08/01/14   10/01/14, MD  ondansetron (ZOFRAN ODT) 4 MG disintegrating tablet 4mg  ODT q4 hours prn nausea/vomit 08/01/14   , MD   BP 110/55 mmHg  Pulse 86  Temp(Src) 101.6 F (38.7 C) (Rectal)  Resp 22  Ht 5' (1.524 m)  Wt 110 lb (49.896 kg)  BMI 21.48 kg/m2  SpO2 97% Physical Exam  Constitutional: She is oriented to person, place, and time. She appears well-developed.  HENT:  Head: Normocephalic.  Eyes: Conjunctivae and EOM are normal. No scleral icterus.  Neck: Neck supple. No thyromegaly present.  Cardiovascular: Normal rate and regular rhythm.  Exam reveals no gallop and no friction rub.   No murmur heard. Pulmonary/Chest: No stridor. She has no wheezes. She has no rales. She exhibits no tenderness.  Abdominal: She exhibits no distension. There is tenderness. There  is no rebound.  Tender suprapubic  Musculoskeletal: Normal range of motion. She exhibits no edema.  Lymphadenopathy:    She has no cervical adenopathy.  Neurological: She is oriented to person, place, and time. She exhibits normal muscle tone. Coordination normal.  Skin: No rash noted. No erythema.  Psychiatric: She has a normal mood and affect. Her behavior is normal.    ED Course  Procedures (including critical care time) Labs Review Labs Reviewed  CBC WITH DIFFERENTIAL/PLATELET - Abnormal; Notable for  the following:    WBC 13.1 (*)    RBC 3.71 (*)    Hemoglobin 10.8 (*)    HCT 32.0 (*)    Neutrophils Relative % 79 (*)    Neutro Abs 10.3 (*)    Monocytes Absolute 1.2 (*)    All other components within normal limits  COMPREHENSIVE METABOLIC PANEL - Abnormal; Notable for the following:    Sodium 131 (*)    Potassium 3.1 (*)    Chloride 98 (*)    Glucose, Bld 117 (*)    BUN <5 (*)    Albumin 3.3 (*)    All other components within normal limits  URINALYSIS, ROUTINE W REFLEX MICROSCOPIC - Abnormal; Notable for the following:    Color, Urine AMBER (*)    APPearance TURBID (*)    Hgb urine dipstick LARGE (*)    Ketones, ur 15 (*)    Protein, ur 30 (*)    Leukocytes, UA MODERATE (*)    All other components within normal limits  URINE MICROSCOPIC-ADD ON - Abnormal; Notable for the following:    Squamous Epithelial / LPF MANY (*)    Bacteria, UA MANY (*)    All other components within normal limits  CULTURE, BLOOD (ROUTINE X 2)  CULTURE, BLOOD (ROUTINE X 2)  URINE CULTURE  I-STAT CG4 LACTIC ACID, ED  POC URINE PREG, ED    Imaging Review Dg Chest 2 View  08/01/2014   CLINICAL DATA:  Cough and fever for 4 days.  Headache.  EXAM: CHEST  2 VIEW  COMPARISON:  09/20/2013  FINDINGS: The heart size and mediastinal contours are within normal limits. Both lungs are clear. The visualized skeletal structures are unremarkable.  IMPRESSION: No active cardiopulmonary disease.   Electronically Signed   By: Myles Rosenthal M.D.   On: 08/01/2014 19:10     EKG Interpretation None      MDM   Final diagnoses:  UTI (lower urinary tract infection)    Uti,  tx with keflex    Bethann Berkshire, MD 08/01/14 2237

## 2014-08-01 NOTE — Discharge Instructions (Signed)
Follow up with your md in 2 days for recheck.  Tylenol for fever

## 2014-08-01 NOTE — ED Notes (Signed)
Pt c/o fever, chills, cough, body aches, headache and abdominal pain for four days.

## 2014-08-04 LAB — URINE CULTURE: Colony Count: >=100000

## 2014-08-06 ENCOUNTER — Telehealth (HOSPITAL_COMMUNITY): Payer: Self-pay

## 2014-08-06 NOTE — Telephone Encounter (Signed)
Post ED Visit - Positive Culture Follow-up  Culture report reviewed by antimicrobial stewardship pharmacist: []  Wes Dulaney, Pharm.D., BCPS []  , Pharm.D., BCPS []  , Pharm.D., BCPS []  [place], .D., BCPS, AAHIVP []  Georgina Pillion, Pharm.D., BCPS, AAHIVP []  , [place].D., BCPS X  [address], Pharm D  Positive Urine culture, 100,000 colonies -> E Coli Treated with Cephalexin, organism sensitive to the same and no further patient follow-up is required at this time.  Estella Husk 08/06/2014, 8:43 PM

## 2014-08-08 LAB — CULTURE, BLOOD (ROUTINE X 2)
Culture: NO GROWTH
Culture: NO GROWTH

## 2014-08-30 ENCOUNTER — Other Ambulatory Visit: Payer: Self-pay | Admitting: Obstetrics

## 2014-08-31 ENCOUNTER — Telehealth: Payer: Self-pay | Admitting: *Deleted

## 2014-08-31 NOTE — Telephone Encounter (Signed)
Pt called to office stating that she had missed a call.  Pt ask for return call.  Attempt to contact pt.  No answer, voicemail full.

## 2014-12-06 ENCOUNTER — Ambulatory Visit: Payer: Medicaid Other | Admitting: Obstetrics

## 2017-08-18 ENCOUNTER — Emergency Department (HOSPITAL_COMMUNITY): Admission: EM | Admit: 2017-08-18 | Discharge: 2017-08-18 | Discharge status: Against Medical Advice | Payer: Self-pay

## 2017-08-18 ENCOUNTER — Other Ambulatory Visit: Payer: Self-pay

## 2017-08-18 NOTE — ED Notes (Signed)
Pt called for triage by Tobi Bastos, RN x3.  No response.

## 2017-08-18 NOTE — ED Triage Notes (Signed)
Nurse called for patient in waiting room no answer.

## 2019-01-27 ENCOUNTER — Other Ambulatory Visit: Payer: Self-pay

## 2019-01-27 ENCOUNTER — Ambulatory Visit (HOSPITAL_COMMUNITY)
Admission: EM | Admit: 2019-01-27 | Discharge: 2019-01-27 | Disposition: A | Discharge status: Home / Self Care | Payer: Medicaid Other | Attending: Family Medicine | Admitting: Family Medicine

## 2019-01-27 ENCOUNTER — Encounter (HOSPITAL_COMMUNITY): Payer: Self-pay

## 2019-01-27 DIAGNOSIS — F17210 Nicotine dependence, cigarettes, uncomplicated: Secondary | ICD-10-CM | POA: Diagnosis not present

## 2019-01-27 DIAGNOSIS — N946 Dysmenorrhea, unspecified: Secondary | ICD-10-CM | POA: Diagnosis not present

## 2019-01-27 DIAGNOSIS — R519 Headache, unspecified: Secondary | ICD-10-CM | POA: Diagnosis present

## 2019-01-27 DIAGNOSIS — Z20828 Contact with and (suspected) exposure to other viral communicable diseases: Secondary | ICD-10-CM | POA: Diagnosis not present

## 2019-01-27 DIAGNOSIS — Z79899 Other long term (current) drug therapy: Secondary | ICD-10-CM | POA: Insufficient documentation

## 2019-01-27 MED ORDER — DEXAMETHASONE SODIUM PHOSPHATE 10 MG/ML IJ SOLN
10.0000 mg | Freq: Once | INTRAMUSCULAR | Status: AC
  Administered 2019-01-27: 10 mg via INTRAMUSCULAR

## 2019-01-27 MED ORDER — KETOROLAC TROMETHAMINE 60 MG/2ML IM SOLN
INTRAMUSCULAR | Status: AC
  Filled 2019-01-27: qty 2

## 2019-01-27 MED ORDER — METOCLOPRAMIDE HCL 5 MG/ML IJ SOLN
INTRAMUSCULAR | Status: AC
  Filled 2019-01-27: qty 2

## 2019-01-27 MED ORDER — KETOROLAC TROMETHAMINE 60 MG/2ML IM SOLN
60.0000 mg | Freq: Once | INTRAMUSCULAR | Status: AC
  Administered 2019-01-27: 09:00:00 60 mg via INTRAMUSCULAR

## 2019-01-27 MED ORDER — METOCLOPRAMIDE HCL 5 MG/ML IJ SOLN
5.0000 mg | Freq: Once | INTRAMUSCULAR | Status: AC
  Administered 2019-01-27: 5 mg via INTRAMUSCULAR

## 2019-01-27 MED ORDER — SUMATRIPTAN SUCCINATE 6 MG/0.5ML ~~LOC~~ SOLN: 6.0000 mg | Freq: Once | SUBCUTANEOUS | Status: DC

## 2019-01-27 MED ORDER — DEXAMETHASONE SODIUM PHOSPHATE 10 MG/ML IJ SOLN
INTRAMUSCULAR | Status: AC
  Filled 2019-01-27: qty 1

## 2019-01-27 NOTE — Discharge Instructions (Addendum)
We treated you for a migraine today Go home and rest Stay hydrated We will call if the COVID is positive

## 2019-01-27 NOTE — ED Provider Notes (Signed)
[facility]    CSN: [id number] Arrival date & time: 01/27/19  0806      History   Chief Complaint Chief Complaint  Patient presents with  . Headache    HPI Dominique [name] is a 43 y.o. female.    Headache Pain location:  Generalized Quality:  Dull Radiates to:  Does not radiate Severity currently:  8/10 Onset quality:  Gradual Duration:  1 week Timing:  Constant Progression:  Unchanged Chronicity:  New Similar to prior headaches: no   Context: not activity, not exposure to bright light, not caffeine, not coughing, not defecating, not eating, not stress, not exposure to cold air, not intercourse, not loud noise and not straining   Relieved by:  Nothing Worsened by:  Nothing Ineffective treatments:  Acetaminophen and NSAIDs Associated symptoms: no abdominal pain, no back pain, no blurred vision, no congestion, no cough, no diarrhea, no dizziness, no drainage, no ear pain, no eye pain, no facial pain, no fatigue, no fever, no focal weakness, no hearing loss, no loss of balance, no myalgias, no nausea, no near-syncope, no neck pain, no neck stiffness, no numbness, no paresthesias, no photophobia, no seizures, no sinus pressure, no sore throat, no swollen glands, no syncope, no tingling, no URI, no visual change, no vomiting and no weakness     Past Medical History:  Diagnosis Date  . Kidney stones     Patient Active Problem List   Diagnosis Date Noted  . Abnormal uterine bleeding (AUB) 05/09/2013  . BV (bacterial vaginosis) 05/09/2013  . Dysmenorrhea 05/09/2013    Past Surgical History:  Procedure Laterality Date  . CESAREAN SECTION    . HERNIA REPAIR      OB History    Gravida  3   Para  3   Term  3   Preterm  0   AB  0   Living  3     SAB  0   TAB  0   Ectopic  0   Multiple  0   Live Births  3            Home Medications    Prior to Admission medications   Medication Sig Start Date End Date Taking? Authorizing  Provider  HYDROcodone-acetaminophen (NORCO/VICODIN) 5-325 MG per tablet Take 1 tablet by mouth every 6 (six) hours as needed for moderate pain. 08/01/14   Bethann Berkshire, MD  ondansetron (ZOFRAN ODT) 4 MG disintegrating tablet 4mg  ODT q4 hours prn nausea/vomit 08/01/14   10/01/14, MD    Family History Family History  Problem Relation Age of Onset  . Hypertension Father   . Diabetes Father     Social History Social History   Tobacco Use  . Smoking status: Current Every Day Smoker    Packs/day: 0.50    Types: Cigarettes  . Smokeless tobacco: Never Used  Substance Use Topics  . Alcohol use: Yes    Alcohol/week: 2.0 standard drinks    Types: 2 Standard drinks or equivalent per week    Comment: occasional  . Drug use: Yes    Types: Marijuana     Allergies   Patient has no known allergies.   Review of Systems Review of Systems  Constitutional: Negative for fatigue and fever.  HENT: Negative for congestion, ear pain, hearing loss, postnasal drip, sinus pressure and sore throat.   Eyes: Negative for blurred vision, photophobia and pain.  Respiratory: Negative for cough.   Cardiovascular: Negative for syncope and near-syncope.  Gastrointestinal: Negative for abdominal pain, diarrhea, nausea and vomiting.  Musculoskeletal: Negative for back pain, myalgias, neck pain and neck stiffness.  Neurological: Positive for headaches. Negative for dizziness, focal weakness, seizures, weakness, numbness, paresthesias and loss of balance.     Physical Exam Triage Vital Signs ED Triage Vitals [01/27/19 0828]  Enc Vitals Group     BP 128/90     Pulse Rate 67     Resp 17     Temp 98.8 F (37.1 C)     Temp Source Oral     SpO2 100 %     Weight      Height      Head Circumference      Peak Flow      Pain Score 10     Pain Loc      Pain Edu?      Excl. in GC?    No data found.  Updated Vital Signs BP 128/90 (BP Location: Left Arm)   Pulse 67   Temp 98.8 F (37.1 C)  (Oral)   Resp 17   LMP 01/12/2019   SpO2 100%   Visual Acuity Right Eye Distance:   Left Eye Distance:   Bilateral Distance:    Right Eye Near:   Left Eye Near:    Bilateral Near:     Physical Exam Vitals signs and nursing note reviewed.  Constitutional:      General: She is not in acute distress.    Appearance: She is well-developed. She is not ill-appearing, toxic-appearing or diaphoretic.  HENT:     Head: Normocephalic and atraumatic.     Mouth/Throat:     Mouth: Mucous membranes are moist.  Eyes:     General: No scleral icterus.    Extraocular Movements: Extraocular movements intact.     Right eye: Normal extraocular motion and no nystagmus.     Left eye: Normal extraocular motion and no nystagmus.     Pupils: Pupils are equal, round, and reactive to light. Pupils are equal.     Right eye: Pupil is round and reactive.     Left eye: Pupil is round and reactive.  Neck:     Musculoskeletal: Normal range of motion and neck supple.  Pulmonary:     Effort: Pulmonary effort is normal.  Musculoskeletal: Normal range of motion.  Skin:    General: Skin is warm and dry.  Neurological:     Mental Status: She is alert.     Cranial Nerves: No cranial nerve deficit, dysarthria or facial asymmetry.     Sensory: No sensory deficit.     Motor: No weakness.     Gait: Gait normal.  Psychiatric:        Mood and Affect: Mood normal.        Speech: Speech normal.        Behavior: Behavior normal.      UC Treatments / Results  Labs (all labs ordered are listed, but only abnormal results are displayed) Labs Reviewed  NOVEL CORONAVIRUS, NAA (HOSP ORDER, SEND-OUT TO REF LAB; TAT 18-24 HRS)    EKG   Radiology No results found.  Procedures Procedures (including critical care time)  Medications Ordered in UC Medications  ketorolac (TORADOL) injection 60 mg (60 mg Intramuscular Given 01/27/19 0854)  metoCLOPramide (REGLAN) injection 5 mg (5 mg Intramuscular Given 01/27/19  0854)  dexamethasone (DECADRON) injection 10 mg (10 mg Intramuscular Given 01/27/19 0854)  dexamethasone (DECADRON) 10 MG/ML injection (has no administration in  time range)  ketorolac (TORADOL) 60 MG/2ML injection (has no administration in time range)  metoCLOPramide (REGLAN) 5 MG/ML injection (has no administration in time range)    Initial Impression / Assessment and Plan / UC Course  I have reviewed the triage vital signs and the nursing notes.  Pertinent labs & imaging results that were available during my care of the patient were reviewed by me and considered in my medical decision making (see chart for details).     Headache- treating with migraine cocktail here in the clinic.  Neuro exam normal No concerns for intracranial abnormality.  Rest, stay hydrated.  Follow up as needed for continued or worsening symptoms COVID test pending.   Final Clinical Impressions(s) / UC Diagnoses   Final diagnoses:  Acute intractable headache, unspecified headache type     Discharge Instructions     We treated you for a migraine today Go home and rest Stay hydrated We will call if the COVID is positive     ED Prescriptions    None     PDMP not reviewed this encounter.   Orvan July, NP 01/27/19 [phone]

## 2019-01-27 NOTE — ED Triage Notes (Signed)
Pt presents with ongoing headache X 7 days with no relief with OTC medication.

## 2019-01-29 LAB — NOVEL CORONAVIRUS, NAA (HOSP ORDER, SEND-OUT TO REF LAB; TAT 18-24 HRS): SARS-CoV-2, NAA: NOT DETECTED

## 2019-01-30 ENCOUNTER — Telehealth: Payer: Medicaid Other | Admitting: Physician Assistant

## 2019-01-30 DIAGNOSIS — R102 Pelvic and perineal pain: Secondary | ICD-10-CM

## 2019-01-30 DIAGNOSIS — R300 Dysuria: Secondary | ICD-10-CM

## 2019-01-30 NOTE — Progress Notes (Signed)
Based on what you shared with me, I feel your condition warrants further evaluation and I recommend that you be seen for a face to face office visit.  Urinary tract infection symptoms with abdominal pain and back pain could be a sign of a more serious infection such as a kidney infection. In order to appropriately treat this, it will require a urine culture so that we can determine exactly what type of bacteria is causing the illness. I would recommend seeking immediate evaluation today from your primary care provider or an urgent care.  NOTE: If you entered your credit card information for this eVisit, you will not be charged. You may see a "hold" on your card for the $35 but that hold will drop off and you will not have a charge processed.  If you are having a true medical emergency please call 911.     For an urgent face to face visit, [place] has four urgent care centers for your convenience:    NEW:  [facility]gent [address], [place] [address] .  Monday - Friday 10 am - 6 pm    . [facility]    [phone]                  Get Driving Directions  [address] [place], [place] [address] . 10 am to 8 pm Monday-Friday . 12 pm to 8 pm Saturday-Sunday   . [facility] at [place]                  Get Driving Directions  [address], [place], [place] [address] . 8 am to 8 pm Monday-Friday . 9 am to 6 pm Saturday . 11 am to 6 pm Sunday     . [facility] at [place]                  Get Driving Directions   [address] [place], [place] [address] . 8 am to 8 pm Monday-Friday . 8 am to 4 pm Saturday-Sunday    . [facility] at [place]                    Get Driving Directions  [phone]  [address] [place], [place] [address]  . Monday-Friday, 12 PM to 6  PM    Your e-visit answers were reviewed by a board certified advanced clinical practitioner to complete your personal care plan.  Thank you for using e-Visits.

## 2019-04-07 ENCOUNTER — Other Ambulatory Visit: Payer: Medicaid Other

## 2019-07-12 ENCOUNTER — Ambulatory Visit: Payer: Medicaid Other | Admitting: Family Medicine

## 2019-07-12 ENCOUNTER — Other Ambulatory Visit: Payer: Self-pay

## 2019-07-28 ENCOUNTER — Ambulatory Visit (INDEPENDENT_AMBULATORY_CARE_PROVIDER_SITE_OTHER): Payer: Medicaid Other | Admitting: Family Medicine

## 2019-07-28 ENCOUNTER — Other Ambulatory Visit: Payer: Self-pay

## 2019-07-28 ENCOUNTER — Encounter: Payer: Self-pay | Admitting: Family Medicine

## 2019-07-28 VITALS — BP 88/62 | HR 73 | Ht 60.0 in | Wt 125.6 lb

## 2019-07-28 DIAGNOSIS — F17200 Nicotine dependence, unspecified, uncomplicated: Secondary | ICD-10-CM

## 2019-07-28 DIAGNOSIS — Z0000 Encounter for general adult medical examination without abnormal findings: Secondary | ICD-10-CM | POA: Diagnosis not present

## 2019-07-28 DIAGNOSIS — R0600 Dyspnea, unspecified: Secondary | ICD-10-CM

## 2019-07-28 DIAGNOSIS — F209 Schizophrenia, unspecified: Secondary | ICD-10-CM | POA: Diagnosis not present

## 2019-07-28 DIAGNOSIS — Z7689 Persons encountering health services in other specified circumstances: Secondary | ICD-10-CM

## 2019-07-28 MED ORDER — NICOTINE 14 MG/24HR TD PT24: 14.0000 mg | MEDICATED_PATCH | Freq: Every day | TRANSDERMAL | 0 refills | Status: DC

## 2019-07-28 NOTE — Progress Notes (Addendum)
    SUBJECTIVE:   CHIEF COMPLAINT / HPI:   Establish care Dominique [name] presents today to establish care. Her niece is a patient of ours. She is unemployed at the moment. She is a mother of 3 and has several grandchildren. She has a family history of asthma, diabetes, heart problem, colon cancer, cirrhosis, and breast cancer.   Paroxysmal nocturnal dyspnea Has been having shortness of breath or gasping for air episodes every night for the past 2 weeks. I wakes her out of her sleep and then she is not able to get back to sleep but it self resolves. She does not have trouble getting to sleep. She sleeps on her side, not on her back She endorses snoring while sleeping but does not know if partner would say that she stops breathing. She also has daytime headaches on average 3x/week. For headaches she sits in a dark room and use tylenol for alleviation. She denies vision changes and chest pain.   Tobacco cessation Current 1/2 pack/day cigarette smoker. Attempted to stop 9 years ago cold [place] when first grandchild was born. She is down to 2 cigarettes and would like help with cessation.   Hx of schizophrenia, Bipolar Sees a therapist at [facility] every 6 weeks. Takes quetiapine. Have tried other antisychotics. Happy with current dose and therapy sessions.  PERTINENT  PMH / PSH: Kidney stones, Hernia repair x2, C/S x3  OBJECTIVE:   BP (!) 88/62   Pulse 73   Ht 5' (1.524 m)   Wt 125 lb 9.6 oz (57 kg)   SpO2 99%   BMI 24.53 kg/m    General: Appears well, no acute distress. Age appropriate. Cardiac: RRR, normal heart sounds, no murmurs Respiratory: CTAB, normal effort Abdomen: soft, nontender, non distended, no hernias, +bowel sounds Psych: normal affect, jovial    Office Visit from 07/28/2019 in [facility]  PHQ-2 Total Score  1      ASSESSMENT/PLAN:   Dyspnea, paroxysmal nocturnal Etiology unknown at this time. Started 2 weeks ago and may self resolve but until  then patient encouraged to keep a diary. Have also ask her to let her partner listen to her sleep and video to see if there is evidence of apnea during sleep. Patient is without significant cardiac history but smoking history could be contributory. She is not obese but description gives me a high suspicion for OSA if issue continues to occur and has an apnea component. No history of adenopathy surgery. No hx of asthma. No hx of GERD. -Keep diary of symptoms and times -Video or comments from partner would be helpful -Start nicotine patch  -Follow up in 1 month or sooner if needed -Consider sleep study  Tobacco use disorder -Start nicotine patch 14mg  daily -1-800-QUITNOW card given -Follow up in 1 month  Schizophrenia (HCC) Mood appears stable today.  -Continue to see [facility] therapist -Continue seroquel 100mg  qhs  Healthcare maintenance -Pap 2 months ago (patient will send records) -Lipid panel wnl -HIV NR   , DO [facility]

## 2019-07-28 NOTE — Patient Instructions (Addendum)
It was very nice to meet you today. Please enjoy the rest of your week.  Continue your current medication and therapy. Nicotine patches has been sent to your pharmacy. I will inform you of your blood work results when they are back. Follow up in 1 month for a follow up visit or sooner if needed.   Please call the clinic at [phone] if your symptoms worsen or you have any concerns. It was our pleasure to serve you.

## 2019-07-29 LAB — LIPID PANEL
Chol/HDL Ratio: 2.7 ratio (ref 0.0–4.4)
Cholesterol, Total: 154 mg/dL (ref 100–199)
HDL: 57 mg/dL (ref >39)
LDL Chol Calc (NIH): 79 mg/dL (ref 0–99)
Triglycerides: 96 mg/dL (ref 0–149)
VLDL Cholesterol Cal: 18 mg/dL (ref 5–40)

## 2019-07-29 LAB — HIV ANTIBODY (ROUTINE TESTING W REFLEX): HIV Screen 4th Generation wRfx: NONREACTIVE

## 2019-07-31 DIAGNOSIS — F209 Schizophrenia, unspecified: Secondary | ICD-10-CM | POA: Insufficient documentation

## 2019-07-31 DIAGNOSIS — Z113 Encounter for screening for infections with a predominantly sexual mode of transmission: Secondary | ICD-10-CM

## 2019-07-31 DIAGNOSIS — R0600 Dyspnea, unspecified: Secondary | ICD-10-CM | POA: Insufficient documentation

## 2019-07-31 DIAGNOSIS — Z0000 Encounter for general adult medical examination without abnormal findings: Secondary | ICD-10-CM | POA: Insufficient documentation

## 2019-07-31 DIAGNOSIS — F17200 Nicotine dependence, unspecified, uncomplicated: Secondary | ICD-10-CM | POA: Insufficient documentation

## 2019-07-31 HISTORY — DX: Encounter for screening for infections with a predominantly sexual mode of transmission: Z11.3

## 2019-07-31 NOTE — Assessment & Plan Note (Signed)
-  Pap 2 months ago (patient will send records) -Lipid panel wnl -HIV NR

## 2019-07-31 NOTE — Assessment & Plan Note (Signed)
-  Start nicotine patch 14mg  daily -1-800-QUITNOW card given -Follow up in 1 month

## 2019-07-31 NOTE — Assessment & Plan Note (Addendum)
Mood appears stable today.  -Continue to see [facility] therapist -Continue seroquel 100mg  qhs

## 2019-07-31 NOTE — Assessment & Plan Note (Addendum)
Etiology unknown at this time. Started 2 weeks ago and may self resolve but until then patient encouraged to keep a diary. Have also ask her to let her partner listen to her sleep and video to see if there is evidence of apnea during sleep. Patient is without significant cardiac history but smoking history could be contributory. She is not obese but description gives me a high suspicion for OSA if issue continues to occur and has an apnea component. No history of adenopathy surgery. No hx of asthma. No hx of GERD. -Keep diary of symptoms and times -Video or comments from partner would be helpful -Start nicotine patch  -Follow up in 1 month or sooner if needed -Consider sleep study

## 2019-08-03 ENCOUNTER — Encounter: Payer: Self-pay | Admitting: Family Medicine

## 2019-08-18 ENCOUNTER — Ambulatory Visit: Payer: Medicaid Other | Admitting: Family Medicine

## 2019-08-18 NOTE — Progress Notes (Deleted)
    SUBJECTIVE:   CHIEF COMPLAINT / HPI:   PND  Smoking cessation  PERTINENT  PMH / PSH: ***  OBJECTIVE:   There were no vitals taken for this visit.  ***  ASSESSMENT/PLAN:   No problem-specific Assessment & Plan notes found for this encounter.     Lavonda Jumbo, DO [facility]

## 2019-09-04 ENCOUNTER — Ambulatory Visit: Payer: Medicaid Other | Admitting: Family Medicine

## 2019-09-04 NOTE — Progress Notes (Deleted)
    SUBJECTIVE:   CHIEF COMPLAINT / HPI:   ***  PERTINENT  PMH / PSH: ***  OBJECTIVE:   There were no vitals taken for this visit.  ***  ASSESSMENT/PLAN:   No problem-specific Assessment & Plan notes found for this encounter.     Hattie Pine Autry-Lott, DO [facility]   

## 2019-09-20 ENCOUNTER — Other Ambulatory Visit: Payer: Self-pay | Admitting: Family Medicine

## 2019-09-20 ENCOUNTER — Other Ambulatory Visit: Payer: Self-pay

## 2019-09-20 DIAGNOSIS — F17200 Nicotine dependence, unspecified, uncomplicated: Secondary | ICD-10-CM

## 2019-09-20 MED ORDER — NICOTINE 14 MG/24HR TD PT24: 14.0000 mg | MEDICATED_PATCH | Freq: Every day | TRANSDERMAL | 0 refills | Status: DC

## 2019-09-20 NOTE — Addendum Note (Signed)
Addended by: Gilberto Better R on: 09/20/2019 04:04 PM   Modules accepted: Orders

## 2019-10-12 ENCOUNTER — Ambulatory Visit (HOSPITAL_COMMUNITY)
Admission: EM | Admit: 2019-10-12 | Discharge: 2019-10-12 | Disposition: A | Discharge status: Home / Self Care | Payer: Medicaid Other

## 2019-10-12 ENCOUNTER — Other Ambulatory Visit: Payer: Self-pay

## 2019-10-13 ENCOUNTER — Telehealth: Payer: Medicaid Other

## 2019-10-13 ENCOUNTER — Ambulatory Visit (HOSPITAL_COMMUNITY): Payer: Self-pay

## 2019-10-16 ENCOUNTER — Encounter: Payer: Self-pay | Admitting: Family Medicine

## 2019-10-16 ENCOUNTER — Other Ambulatory Visit: Payer: Self-pay

## 2019-10-16 ENCOUNTER — Other Ambulatory Visit (HOSPITAL_COMMUNITY)
Admission: RE | Admit: 2019-10-16 | Discharge: 2019-10-16 | Disposition: A | Discharge status: Home / Self Care | Payer: Medicaid Other | Source: Ambulatory Visit | Attending: Family Medicine | Admitting: Family Medicine

## 2019-10-16 ENCOUNTER — Ambulatory Visit (INDEPENDENT_AMBULATORY_CARE_PROVIDER_SITE_OTHER): Payer: Medicaid Other | Admitting: Family Medicine

## 2019-10-16 VITALS — BP 100/70 | HR 81 | Ht 60.0 in | Wt 126.6 lb

## 2019-10-16 DIAGNOSIS — Z0000 Encounter for general adult medical examination without abnormal findings: Secondary | ICD-10-CM

## 2019-10-16 DIAGNOSIS — F17200 Nicotine dependence, unspecified, uncomplicated: Secondary | ICD-10-CM | POA: Diagnosis not present

## 2019-10-16 DIAGNOSIS — K625 Hemorrhage of anus and rectum: Secondary | ICD-10-CM | POA: Diagnosis not present

## 2019-10-16 DIAGNOSIS — Z113 Encounter for screening for infections with a predominantly sexual mode of transmission: Secondary | ICD-10-CM | POA: Diagnosis present

## 2019-10-16 DIAGNOSIS — N939 Abnormal uterine and vaginal bleeding, unspecified: Secondary | ICD-10-CM

## 2019-10-16 DIAGNOSIS — A599 Trichomoniasis, unspecified: Secondary | ICD-10-CM

## 2019-10-16 MED ORDER — NICOTINE POLACRILEX 2 MG MT GUM: 2.0000 mg | CHEWING_GUM | OROMUCOSAL | 0 refills | Status: DC | PRN

## 2019-10-16 NOTE — Patient Instructions (Signed)
It was very nice to meet you today. Please enjoy the rest of your week. I will call you with today's results.   Please call the clinic at [phone] if your symptoms worsen or you have any concerns. It was our pleasure to serve you.

## 2019-10-16 NOTE — Progress Notes (Signed)
    SUBJECTIVE:   CHIEF COMPLAINT / HPI:   Pap Smear Last Pap smear unknown.  Prior paps: Stated to be normal by patient: No records according to last PCP. LMP 7/1-7/18  Abnormal bleeding: Yes have been bleeding from vaginal area for the time above. This has never happened before. Stopped bleeding yesterday.  Contraception: Condoms occasionally  Sexually Active: Yes, 1 female partner  Desire for STD Screening: Yes Concerns: Prolonged bleeding.   Rectal bleeding Had bright red blood per rectum for 17 days along with her period. There were streaks of blood in her bowels and when she would wipe her rectum. She does not endorse dizziness, tingling, or shortness of breath. Since yesterday she has had a normal bowel movement without any sign of blood.  States she has a family hx with mom and maternal aunt having surgery for twisted bowels.   Nicotine Patch rash Continues to have rashes where she places the patches. It is red but not itchy. She has tried different areas on her body. She would like to stop the patches and start gum. She is down to 1-2 cigarettes per day.    PERTINENT  PMH / PSH: Kidney stones, AUB (6 years ago), BV, Schizophrenia  OBJECTIVE:   BP 100/70   Pulse 81   Ht 5' (1.524 m)   Wt 126 lb 9.6 oz (57.4 kg)   SpO2 99%   BMI 24.72 kg/m   General: Appears well, no acute distress. Age appropriate. Cardiac: RRR, normal heart sounds, no murmurs Respiratory: CTAB, normal effort Abdomen: soft, nontender, nondistended Extremities: No edema or cyanosis. Skin: Warm and dry, multiple trunk raised macular erythematous rashes in a square shape pattern. Neuro: alert and orientedx4 Psych: normal affect Pelvic exam: PAP: Pap smear done today. normal external genitalia, vulva, vagina, cervix, uterus and adnexa. DNA probe for chlamydia and GC obtained, HPV test. WET MOUNT done - results: trichomonads. RECTAL: normal rectal, no masses, no visible warts. Exam chaperoned by Gilberto Better.  ASSESSMENT/PLAN:   Abnormal uterine bleeding (AUB) Trichomonas positive. Could likely be the precipitating factor. Will treat with 500 mg flagyl BID for 7 days and monitor for improvement. Pap performed and NILM. Repeat pap in 3-5 years. Patient notified of these results via phone and voiced understanding. Encouraged to follow up in 1 month or sooner if prolonged bleeding continues.  BRBPR (bright red blood per rectum) Resolved. Possibility of anal bleeding with positive trich if patient is having anal sex. No anal fissures, hemorrhoids, or lesions were visualized with anoscope. With patient's history and her request will refer to gastroenterology today. Hgb 10.3 with of cell lines decreasing is likely a true anemia. Will consider iron studies in the future. Patient encouraged to return if symptoms return.   Tobacco use disorder Nicotine gum sent to pharmacy  Lavonda Jumbo, DO [facility]

## 2019-10-17 ENCOUNTER — Encounter: Payer: Self-pay | Admitting: Gastroenterology

## 2019-10-17 LAB — CBC WITH DIFFERENTIAL/PLATELET
Basophils Absolute: 0 10*3/uL (ref 0.0–0.2)
Basos: 0 %
EOS (ABSOLUTE): 0.1 10*3/uL (ref 0.0–0.4)
Eos: 1 %
Hematocrit: 31.2 % — ABNORMAL LOW (ref 34.0–46.6)
Hemoglobin: 10.3 g/dL — ABNORMAL LOW (ref 11.1–15.9)
Immature Grans (Abs): 0 10*3/uL (ref 0.0–0.1)
Immature Granulocytes: 0 %
Lymphocytes Absolute: 3.2 10*3/uL — ABNORMAL HIGH (ref 0.7–3.1)
Lymphs: 40 %
MCH: 30 pg (ref 26.6–33.0)
MCHC: 33 g/dL (ref 31.5–35.7)
MCV: 91 fL (ref 79–97)
Monocytes Absolute: 0.4 10*3/uL (ref 0.1–0.9)
Monocytes: 5 %
Neutrophils Absolute: 4.2 10*3/uL (ref 1.4–7.0)
Neutrophils: 54 %
Platelets: 226 10*3/uL (ref 150–450)
RBC: 3.43 x10E6/uL — ABNORMAL LOW (ref 3.77–5.28)
RDW: 14.3 % (ref 11.7–15.4)
WBC: 7.8 10*3/uL (ref 3.4–10.8)

## 2019-10-17 LAB — HIV ANTIBODY (ROUTINE TESTING W REFLEX): HIV Screen 4th Generation wRfx: NONREACTIVE

## 2019-10-17 LAB — CYTOLOGY - PAP
Chlamydia: NEGATIVE
Comment: NEGATIVE
Comment: NEGATIVE
Comment: NEGATIVE
Comment: NORMAL
Diagnosis: NEGATIVE
High risk HPV: NEGATIVE
Neisseria Gonorrhea: NEGATIVE
Trichomonas: POSITIVE — AB

## 2019-10-17 LAB — RPR: RPR Ser Ql: NONREACTIVE

## 2019-10-18 ENCOUNTER — Telehealth: Payer: Self-pay | Admitting: Family Medicine

## 2019-10-18 DIAGNOSIS — K625 Hemorrhage of anus and rectum: Secondary | ICD-10-CM | POA: Insufficient documentation

## 2019-10-18 DIAGNOSIS — K922 Gastrointestinal hemorrhage, unspecified: Secondary | ICD-10-CM | POA: Insufficient documentation

## 2019-10-18 MED ORDER — METRONIDAZOLE 500 MG PO TABS: 500.0000 mg | ORAL_TABLET | Freq: Two times a day (BID) | ORAL | 0 refills | Status: AC

## 2019-10-18 NOTE — Assessment & Plan Note (Signed)
Nicotine gum sent to pharmacy

## 2019-10-18 NOTE — Assessment & Plan Note (Addendum)
Resolved. Possibility of anal bleeding with positive trich if patient is having anal sex. The patient denies this. No anal fissures, hemorrhoids, or lesions were visualized with anoscope. With patient's history and her request will refer to gastroenterology today. Hgb 10.3 with of cell lines decreasing is likely a true anemia. Will consider iron studies in the future. Patient encouraged to return if symptoms return.

## 2019-10-18 NOTE — Telephone Encounter (Signed)
Patient notified of results to Pap, STD testing, and blood work. Voiced understanding. Treatment for trich sent into the pharmacy. Counseled on abstinence from sex until fully treated. She has notified her partner to be tested and treated.   Dr. Salvadore Dom, DO

## 2019-10-18 NOTE — Assessment & Plan Note (Addendum)
Trichomonas positive. Could likely be the precipitating factor. Will treat with 500 mg flagyl BID for 7 days and monitor for improvement. Pap performed and NILM. Repeat pap in 3-5 years. Patient notified of these results via phone and voiced understanding. Encouraged to follow up in 1 month or sooner if prolonged bleeding continues.

## 2019-11-16 ENCOUNTER — Ambulatory Visit: Payer: Medicaid Other | Admitting: Family Medicine

## 2019-11-16 NOTE — Progress Notes (Deleted)
    SUBJECTIVE:   CHIEF COMPLAINT / HPI:   ***  PERTINENT  PMH / PSH: ***  OBJECTIVE:   There were no vitals taken for this visit.  ***  ASSESSMENT/PLAN:   No problem-specific Assessment & Plan notes found for this encounter.     Taje Tondreau Autry-Lott, DO [facility]   

## 2019-11-28 ENCOUNTER — Ambulatory Visit: Payer: Medicaid Other | Admitting: Family Medicine

## 2019-11-28 NOTE — Progress Notes (Deleted)
    SUBJECTIVE:   CHIEF COMPLAINT / HPI:   ***  PERTINENT  PMH / PSH: ***  OBJECTIVE:   There were no vitals taken for this visit.  ***  ASSESSMENT/PLAN:   No problem-specific Assessment & Plan notes found for this encounter.     Ell Tiso Autry-Lott, DO [facility]   

## 2019-12-18 ENCOUNTER — Ambulatory Visit: Payer: Medicaid Other | Admitting: Gastroenterology

## 2019-12-19 DIAGNOSIS — H538 Other visual disturbances: Secondary | ICD-10-CM | POA: Insufficient documentation

## 2020-01-04 DIAGNOSIS — M329 Systemic lupus erythematosus, unspecified: Secondary | ICD-10-CM | POA: Insufficient documentation

## 2020-02-06 ENCOUNTER — Other Ambulatory Visit: Payer: Self-pay

## 2020-02-15 ENCOUNTER — Ambulatory Visit: Payer: Medicaid Other | Admitting: Gastroenterology

## 2020-02-21 ENCOUNTER — Ambulatory Visit: Payer: Medicaid Other | Admitting: Family Medicine

## 2020-04-26 ENCOUNTER — Other Ambulatory Visit: Payer: Self-pay

## 2020-04-26 ENCOUNTER — Ambulatory Visit (INDEPENDENT_AMBULATORY_CARE_PROVIDER_SITE_OTHER): Payer: Medicaid Other

## 2020-04-26 ENCOUNTER — Ambulatory Visit (HOSPITAL_COMMUNITY)
Admission: EM | Admit: 2020-04-26 | Discharge: 2020-04-26 | Disposition: A | Discharge status: Home / Self Care | Payer: Medicaid Other | Attending: Medical Oncology | Admitting: Medical Oncology

## 2020-04-26 ENCOUNTER — Encounter (HOSPITAL_COMMUNITY): Payer: Self-pay

## 2020-04-26 DIAGNOSIS — M25551 Pain in right hip: Secondary | ICD-10-CM

## 2020-04-26 MED ORDER — MELOXICAM 15 MG PO TABS: 15.0000 mg | ORAL_TABLET | Freq: Every day | ORAL | 0 refills | Status: AC | PRN

## 2020-04-26 NOTE — ED Triage Notes (Signed)
Pt in with c/o right leg pain that has been intermittently going on for 1 week now  Pt states that when she got out of bed yesterday her leg gave out on her   Pt has been using warm compress and epsom salt soaks for relief  Denies any recent injury or falls

## 2020-04-26 NOTE — ED Provider Notes (Addendum)
[facility]    CSN: [id number] Arrival date & time: 04/26/20  4098      History   Chief Complaint Chief Complaint  Patient presents with  . Leg Pain    HPI Dominique [name] is a 45 y.o. female.   HPI   Leg Pain: Pt reports that she has been having right leg pain/hip for 1 week. She denies any known injury. She states that her hip pain is mostly anterior nature but does involve the entire hip. Pain rated an 8 out of 10 at its maximum. She has tried Tylenol which is helped some with the pain. She does state that yesterday when she was getting out of bed the pain caused her to fall although she denies any new injury or hitting her head or having loss of consciousness. She denies any significant muscle weakness, skin color changes of the leg, edema of the leg. No calf pain, SOB or chest pain. No known previous injury.   Past Medical History:  Diagnosis Date  . Kidney stones     Patient Active Problem List   Diagnosis Date Noted  . BRBPR (bright red blood per rectum) 10/18/2019  . Tobacco use disorder 07/31/2019  . Dyspnea, paroxysmal nocturnal 07/31/2019  . Schizophrenia (HCC) 07/31/2019  . Encounter for screening examination for sexually transmitted disease 07/31/2019  . Abnormal uterine bleeding (AUB) 05/09/2013  . BV (bacterial vaginosis) 05/09/2013  . Dysmenorrhea 05/09/2013    Past Surgical History:  Procedure Laterality Date  . CESAREAN SECTION    . HERNIA REPAIR      OB History    Gravida  3   Para  3   Term  3   Preterm  0   AB  0   Living  3     SAB  0   IAB  0   Ectopic  0   Multiple  0   Live Births  3            Home Medications    Prior to Admission medications   Medication Sig Start Date End Date Taking? Authorizing Provider  meloxicam (MOBIC) 15 MG tablet Take 1 tablet (15 mg total) by mouth daily as needed for up to 14 days for pain. 04/26/20 05/10/20 Yes Covington, Sarah M, PA-C  buPROPion (WELLBUTRIN XL) 150 MG  24 hr tablet Take 150 mg by mouth daily. 10/08/19   [provider]  nicotine polacrilex (NICORETTE) 2 MG gum Take 1 each (2 mg total) by mouth as needed for smoking cessation. 10/16/19   Autry-Lott, Randa Evens, DO  QUEtiapine (SEROQUEL) 100 MG tablet     [provider]    Family History Family History  Problem Relation Age of Onset  . Hypertension Father   . Diabetes Father   . Asthma Brother     Social History Social History   Tobacco Use  . Smoking status: Current Every Day Smoker    Packs/day: 0.50    Types: Cigarettes  . Smokeless tobacco: Never Used  Substance Use Topics  . Alcohol use: Yes    Alcohol/week: 2.0 standard drinks    Types: 2 Standard drinks or equivalent per week    Comment: occasional  . Drug use: Yes    Types: Marijuana     Allergies   Patient has no known allergies.   Review of Systems Review of Systems  As stated above in HPI  Physical Exam Triage Vital Signs ED Triage Vitals  Enc Vitals  Group     BP 04/26/20 0856 (!) 117/50     Pulse Rate 04/26/20 0856 72     Resp 04/26/20 0856 17     Temp 04/26/20 0856 99.1 F (37.3 C)     Temp src --      SpO2 04/26/20 0856 100 %     Weight --      Height --      Head Circumference --      Peak Flow --      Pain Score 04/26/20 0854 8     Pain Loc --      Pain Edu? --      Excl. in GC? --    No data found.  Updated Vital Signs BP (!) 117/50   Pulse 72   Temp 99.1 F (37.3 C)   Resp 17   LMP 03/17/2020   SpO2 100%    Physical Exam Vitals and nursing note reviewed.  Cardiovascular:     Rate and Rhythm: Normal rate and regular rhythm.  Musculoskeletal:        General: Tenderness (of the right hip) present. No swelling, deformity or signs of injury.     Right lower leg: No edema.     Left lower leg: No edema.     Comments: No midline back pain. ROM of right hip moderately decreased throughout due to pain. No tenderness to palpation of bilateral knees. No abnormal ROM of  bilateral knees.   Neurological:     Motor: Weakness (4/5 strength of the right leg due to pain; 5/5 strength of the left leg) present.     Gait: Gait abnormal.      UC Treatments / Results  Labs (all labs ordered are listed, but only abnormal results are displayed) Labs Reviewed - No data to display   Radiology DG Hip Unilat With Pelvis 2-3 Views Right  Result Date: 04/26/2020 CLINICAL DATA:  Right leg pain EXAM: DG HIP (WITH OR WITHOUT PELVIS) 2-3V RIGHT COMPARISON:  None. FINDINGS: There is no evidence of hip fracture or dislocation. There is no evidence of arthropathy or other focal bone abnormality. IMPRESSION: Negative. Electronically Signed   By: Guadlupe Spanish M.D.   On: 04/26/2020 09:58    Procedures Procedures (including critical care time)  Medications Ordered in UC Medications - No data to display  Initial Impression / Assessment and Plan / UC Course  I have reviewed the triage vital signs and the nursing notes.  Pertinent labs & imaging results that were available during my care of the patient were reviewed by me and considered in my medical decision making (see chart for details).     New. Given examination and her history of tobacco use I have recommended an x-ray of her hip area which is negative. She is UTD on her depo provera injections. Without calf pain, SOB or chest pain DVT is unlikely however we reviewed red flag symptoms. For now I will treat her with Mobic for pain relief and discussed bursitis. She will continue warm compresses. Again we reviewed red flag symptoms that would warrant additional work-up. Crutches discussed and administered to patient in the meantime.    Final Clinical Impressions(s) / UC Diagnoses   Final diagnoses:  Right hip pain   Discharge Instructions   None    ED Prescriptions    Medication Sig Dispense Auth. Provider   meloxicam (MOBIC) 15 MG tablet Take 1 tablet (15 mg total) by mouth daily as needed for up  to 14 days for  pain. 14 tablet Rushie Chestnut, [place]     PDMP not reviewed this encounter.   Rushie Chestnut, PA-C 04/26/20 1008    [address] PA-C 04/26/20 1016

## 2020-05-15 ENCOUNTER — Telehealth: Payer: Medicaid Other | Admitting: Physician Assistant

## 2020-05-15 DIAGNOSIS — M5440 Lumbago with sciatica, unspecified side: Secondary | ICD-10-CM

## 2020-05-15 NOTE — Progress Notes (Signed)
Based on what you shared with me, I feel your condition warrants further evaluation and I recommend that you be seen for a face to face office visit. Weakness and numbness in your legs can be a warning sign for more serious problems and requires a full neurologic exam to correctly diagnose.     NOTE: If you entered your credit card information for this eVisit, you will not be charged. You may see a "hold" on your card for the $35 but that hold will drop off and you will not have a charge processed.   If you are having a true medical emergency please call 911.      For an urgent face to face visit, [place] has five urgent care centers for your convenience:     [facility] at [facility] Directions [phone] [address] [place], [place] [address] . 10 am - 6pm Monday - Friday    [facility]) Get Driving Directions [phone] [address] [place], [place] [address] . 10 am to 8 pm Monday-Friday . 12 pm to 8 pm [facility] at [facility] Get Driving Directions [phone] [address] [place], [place] [address] . 8 am to 8 pm Monday-Friday . 9 am to 6 pm Saturday . 11 am to 6 pm Sunday     [facility] at [facility] Get Driving Directions  [phone] [address] [place], [place] [address] . 8 am to 8 pm Monday-Friday . 8 am to 4 pm [facility] at [facility] Directions [phone] [address] [place], [place] [address] . 12 pm to 6 pm Monday-Friday      Your e-visit answers were reviewed by a board certified advanced clinical practitioner to complete your personal care plan.  Thank you for using e-Visits.    Greater than 5 minutes, yet less than 10 minutes of time have been spent researching, coordinating, and implementing care for this patient  today

## 2020-05-16 DIAGNOSIS — M25551 Pain in right hip: Secondary | ICD-10-CM | POA: Insufficient documentation

## 2020-05-16 NOTE — Progress Notes (Signed)
Patient late-cancelled her 05/17/2020 appointment. Patient also had no-showed appointments 11/16/2019, 11/28/2019, and 02/21/2020. Patient sent a letter regarding her missed appointments and our no show policy on MyChart and via mail.   Peggyann Shoals, DO [facility], PGY-3 05/21/2020 6:47 AM

## 2020-05-17 ENCOUNTER — Ambulatory Visit (INDEPENDENT_AMBULATORY_CARE_PROVIDER_SITE_OTHER): Payer: Medicaid Other | Admitting: Family Medicine

## 2020-05-17 DIAGNOSIS — M25551 Pain in right hip: Secondary | ICD-10-CM

## 2020-05-17 DIAGNOSIS — Z5329 Procedure and treatment not carried out because of patient's decision for other reasons: Secondary | ICD-10-CM

## 2020-05-19 NOTE — Progress Notes (Signed)
    SUBJECTIVE:   CHIEF COMPLAINT / HPI:   Leg and hip pain: Patient experiencing left sided leg and hip pain for the past 3 weeks.  She just woke up "stiff and hurting "1 day.  It has been a constant pain since that time.  Never had these issues before.  No inciting incident prior to this.  She has been taking Tylenol arthritis, 2 pills of extra strength 3-4 times a day.  And using a heating pad.  Not using any topical treatments.  Feels like the back of her leg is numb.  Had some tingling in her feet.  No back pain.  Nothing makes it better.  Standing on it for too long makes it worse.  No urinary incontinence.  PERTINENT  PMH / PSH: Right hip pain  OBJECTIVE:   BP 108/72   Pulse 78   Ht 5' (1.524 m)   Wt 128 lb 6.4 oz (58.2 kg)   SpO2 99%   BMI 25.08 kg/m   General: Alert, oriented.  Not in pain. CV: Regular rate and rhythm, Pulmonary: Lungs clear auscultation bilaterally MSK: Tenderness to palpation of the right anterior hip.  No tenderness of the greater trochanter or sacroiliac region.  No spinal tenderness.  Positive right-sided straight leg test with minimal movement but pain does not radiate past the knee.  Positive Pearlean Brownie and FADIR test anterior hip pain/groin pain with minimal effort.  Patient has antalgic gait.  ASSESSMENT/PLAN:   Right hip pain Location of pain on the anterior hip joint comply just possible arthritis, but sudden onset more likely to represent an impingement or labral tear, although patient does not recall any trauma or injury.  No signs of sciatica.  Does not appear to involve the sacroiliac leg or greater trochanter.  Will prescribe Mobic, Voltaren gel.  Will get BMP as well.  Is to reach out in 2 weeks if not improving and we can refer to sports medicine.     Sandre Kitty, MD [facility]

## 2020-05-20 ENCOUNTER — Ambulatory Visit (INDEPENDENT_AMBULATORY_CARE_PROVIDER_SITE_OTHER): Payer: Medicaid Other | Admitting: Family Medicine

## 2020-05-20 ENCOUNTER — Other Ambulatory Visit: Payer: Self-pay

## 2020-05-20 VITALS — BP 108/72 | HR 78 | Ht 60.0 in | Wt 128.4 lb

## 2020-05-20 DIAGNOSIS — M25551 Pain in right hip: Secondary | ICD-10-CM

## 2020-05-20 MED ORDER — MELOXICAM 15 MG PO TABS: 15.0000 mg | ORAL_TABLET | Freq: Every day | ORAL | 0 refills | Status: AC

## 2020-05-20 MED ORDER — DICLOFENAC SODIUM 1 % EX GEL: 2.0000 g | Freq: Four times a day (QID) | CUTANEOUS | 1 refills | Status: DC

## 2020-05-20 NOTE — Patient Instructions (Signed)
It was nice to see you today,  I would like you to keep taking your Tylenol as needed.  Do not take more than 1000 mg 3 times a day.  I have prescribed a medicine called Mobic that I would like you to take once a day.  This is an anti-inflammatory medicine.  I have also prescribed a gel they can pick up over-the-counter as well.  You can put this on the hip and leg 4 times a day as needed.  Continue to use heat on the leg.  Continue to rest it.  In 2 weeks if your symptoms have not improved, please call our office so we can refer you to the sports medicine clinic.  Have a great day,  Frederic Jericho, MD

## 2020-05-21 DIAGNOSIS — Z5329 Procedure and treatment not carried out because of patient's decision for other reasons: Secondary | ICD-10-CM | POA: Insufficient documentation

## 2020-05-21 DIAGNOSIS — Z91199 Patient's noncompliance with other medical treatment and regimen due to unspecified reason: Secondary | ICD-10-CM | POA: Insufficient documentation

## 2020-05-21 LAB — BASIC METABOLIC PANEL
BUN/Creatinine Ratio: 16 (ref 9–23)
BUN: 12 mg/dL (ref 6–24)
CO2: 21 mmol/L (ref 20–29)
Calcium: 8.9 mg/dL (ref 8.7–10.2)
Chloride: 104 mmol/L (ref 96–106)
Creatinine, Ser: 0.73 mg/dL (ref 0.57–1.00)
GFR calc Af Amer: 117 mL/min/{1.73_m2} (ref >59)
GFR calc non Af Amer: 101 mL/min/{1.73_m2} (ref >59)
Glucose: 84 mg/dL (ref 65–99)
Potassium: 3.9 mmol/L (ref 3.5–5.2)
Sodium: 138 mmol/L (ref 134–144)

## 2020-05-22 NOTE — Assessment & Plan Note (Signed)
Location of pain on the anterior hip joint comply just possible arthritis, but sudden onset more likely to represent an impingement or labral tear, although patient does not recall any trauma or injury.  No signs of sciatica.  Does not appear to involve the sacroiliac leg or greater trochanter.  Will prescribe Mobic, Voltaren gel.  Will get BMP as well.  Is to reach out in 2 weeks if not improving and we can refer to sports medicine.

## 2020-11-07 DIAGNOSIS — R634 Abnormal weight loss: Secondary | ICD-10-CM | POA: Insufficient documentation

## 2020-11-07 DIAGNOSIS — F250 Schizoaffective disorder, bipolar type: Secondary | ICD-10-CM | POA: Insufficient documentation

## 2020-11-07 DIAGNOSIS — R591 Generalized enlarged lymph nodes: Secondary | ICD-10-CM | POA: Insufficient documentation

## 2020-11-07 DIAGNOSIS — J302 Other seasonal allergic rhinitis: Secondary | ICD-10-CM | POA: Insufficient documentation

## 2020-11-07 DIAGNOSIS — L52 Erythema nodosum: Secondary | ICD-10-CM | POA: Insufficient documentation

## 2020-11-14 DIAGNOSIS — R7689 Other specified abnormal immunological findings in serum: Secondary | ICD-10-CM | POA: Insufficient documentation

## 2020-11-14 DIAGNOSIS — R700 Elevated erythrocyte sedimentation rate: Secondary | ICD-10-CM | POA: Insufficient documentation

## 2020-11-21 DIAGNOSIS — R7989 Other specified abnormal findings of blood chemistry: Secondary | ICD-10-CM | POA: Insufficient documentation

## 2020-11-25 DIAGNOSIS — R202 Paresthesia of skin: Secondary | ICD-10-CM | POA: Insufficient documentation

## 2020-12-13 ENCOUNTER — Encounter: Payer: Self-pay | Admitting: Neurology

## 2020-12-18 ENCOUNTER — Other Ambulatory Visit: Payer: Self-pay | Admitting: Infectious Diseases

## 2020-12-18 ENCOUNTER — Other Ambulatory Visit: Payer: Self-pay

## 2020-12-18 ENCOUNTER — Encounter: Payer: Self-pay | Admitting: Infectious Diseases

## 2020-12-18 ENCOUNTER — Ambulatory Visit (INDEPENDENT_AMBULATORY_CARE_PROVIDER_SITE_OTHER): Payer: Medicaid Other | Admitting: Infectious Diseases

## 2020-12-18 ENCOUNTER — Telehealth: Payer: Self-pay | Admitting: *Deleted

## 2020-12-18 VITALS — BP 105/61 | HR 99 | Temp 98.2°F | Ht 61.0 in | Wt 122.0 lb

## 2020-12-18 DIAGNOSIS — H538 Other visual disturbances: Secondary | ICD-10-CM | POA: Diagnosis not present

## 2020-12-18 DIAGNOSIS — A523 Neurosyphilis, unspecified: Secondary | ICD-10-CM | POA: Diagnosis not present

## 2020-12-18 DIAGNOSIS — F17200 Nicotine dependence, unspecified, uncomplicated: Secondary | ICD-10-CM | POA: Diagnosis not present

## 2020-12-18 DIAGNOSIS — A5149 Other secondary syphilitic conditions: Secondary | ICD-10-CM | POA: Diagnosis not present

## 2020-12-18 DIAGNOSIS — IMO0001 Reserved for inherently not codable concepts without codable children: Secondary | ICD-10-CM | POA: Insufficient documentation

## 2020-12-18 HISTORY — DX: Other visual disturbances: H53.8

## 2020-12-18 NOTE — Progress Notes (Addendum)
[facility]                                                             [address], [place], [place], [address]                                                                  Phn. 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  Small lymph node palpable in the left lateral neck otherwise no lymphadenopathy noted in the rt neck, posterior neck, bilateral axilla and bilateral groin  Patient has received 3 doses of weekly IM Benzathine Penicillin by PCP RPR was last NR in 10/16/19 RPR reactive , 1:32, Treponema pallidum antibodies reactive  11/07/20  Concern for Neurosyphilis  given new onset memory loss and blurry vision. Clinically does not appear to have meningitis. No obvious signs and symptoms of uveitis. No hearing issues.   Recent h/o Trichomoniasis and BV - treated   Multiple Joint Pain in the setting of positive ANA and CCP ab: has been referred to Rheumatology by PCP   Plan IR consult for LP. Please send CSF sample for cell count, glucose and protein, gram  stain and cx. CSF VDRL and cytology. Radiology needs CT head wo contrast prior to LP. Patient is willing to have an LP done and have a diagnosis. PICC line, plan to start Penicillin 24 million continuous IV infusion daily for 14 days  Ophthalmology referral  Follow up in 1-2 weeks, may need to discuss about PrEP if HIV is negative  Orders Placed This Encounter  Procedures   IR PICC PLACEMENT LEFT >5 YRS INC IMG GUIDE   DG FL GUIDED LUMBAR PUNCTURE   CT HEAD WO CONTRAST ( )   RPR  HIV antibody (with reflex)   Hepatitis C antibody   Hepatitis B surface antibody,qualitative   Hepatitis B surface antigen   Hepatitis B core antibody, total   Ambulatory referral to Ophthalmology    All questions and concerns were discussed and addressed. Patient verbalized understanding of the plan. ____________________________________________________________________________________________________________________ HPI:  45 year old female with PMH of schizophrenia who is referred from PCP due to concerns for Neurosyphilis in the setting of new onset memory loss. She was found to have syphilis in Nov 13, 2020 and has already received 3 doses of IM Benzathine penicillin from her PCP. She tells me that her partner has also been treated. She was last sexually active with a female partner on October 20, 2020. She has not been sexually active since then. She has 3 children aged 75, 46 and 21 years. She smokes and is down from to 5 cigarettes daily and smokes marijuana, denies alcohol use and using any illicit drug use.   She had a  break out in her torso when she was found to have syphilis and associated  lymph node swelling in her bilateral neck. The Lymph node swelling in the rt lateral neck has resolved and she still feels some tenderness in the left lateral neck region and left groin. She denies having fevers, chills and sweats. Denies nausea, vomiting and diarrhea but complains of abdominal pain on and off. Appetite  is good and denies any weight changes. Complains of on and off headache for a month, occasional dizziness, blurry vision for 2 weeks which is new, denies any hearing abnormalities. Denies any new weakness/tingling. She tells me she has had numbness in her bilateral upper and lower extremities for a while and is taking Gabapentin. Denies any neck or back pain. She has also been referred to Rheumatology for concerns of lupus given elevated ANA, anti CCP ab, ESR. She also complains of bilateral pain in her bilateral elbows, bilateral knees and ankles, bilateral shoulders ( less severe). Denies any cough, chest pain and SOB. Denies any GU symptoms, vaginal ulcers/discharge or itching.   ROS: Constitutional: Negative for fever, chills, activity change, appetite change, fatigue and unexpected weight change.  Respiratory: Negative for cough, shortness of breath Cardiovascular: Negative for chest pain, palpitations and leg swelling.  Gastrointestinal: Negative for nausea, vomiting, diarrhea/constipation, abdominal pain + Genitourinary: Negative for dysuria, hematuria, flank pain Musculoskeletal: Negative for myalgias, back pain, joint swelling, multiple joint pain as above  Skin: Negative for rashes, lesions  Neurological: Negative for weakness,  headache  Past Medical History:  Diagnosis Date   Kidney stones    Past Surgical History:  Procedure Laterality Date   CESAREAN SECTION     HERNIA REPAIR     No Known Allergies  Social History   Socioeconomic History   Marital status: Single    Spouse name: Not on file   Number of children: Not on file   Years of education: Not on file   Highest education level: Not on file  Occupational History   Not on file  Tobacco Use   Smoking status: Every Day    Packs/day: 0.50    Types: Cigarettes   Smokeless tobacco: Never  Substance and Sexual Activity   Alcohol use: Yes    Alcohol/week: 2.0 standard drinks    Types: 2 Standard drinks or equivalent  per week    Comment: occasional   Drug use: Yes    Types: Marijuana   Sexual activity: Yes    Partners: Male  Birth control/protection: Surgical  Other Topics Concern   Not on file  Social History Narrative   Not on file   Social Determinants of Health   Financial Resource Strain: Not on file  Food Insecurity: Not on file  Transportation Needs: Not on file  Physical Activity: Not on file  Stress: Not on file  Social Connections: Not on file  Intimate Partner Violence: Not on file   Social History   Socioeconomic History   Marital status: Single    Spouse name: Not on file   Number of children: Not on file   Years of education: Not on file   Highest education level: Not on file  Occupational History   Not on file  Tobacco Use   Smoking status: Every Day    Packs/day: 0.25    Types: Cigarettes   Smokeless tobacco: Never  Substance and Sexual Activity   Alcohol use: Yes    Alcohol/week: 2.0 standard drinks    Types: 2 Standard drinks or equivalent per week    Comment: occasional   Drug use: Yes    Types: Marijuana   Sexual activity: Yes    Partners: Male    Birth control/protection: Surgical, Injection  Other Topics Concern   Not on file  Social History Narrative   Not on file   Social Determinants of Health   Financial Resource Strain: Not on file  Food Insecurity: Not on file  Transportation Needs: Not on file  Physical Activity: Not on file  Stress: Not on file  Social Connections: Not on file  Intimate Partner Violence: Not on file     Vitals BP 105/61   Pulse 99   Temp 98.2 F (36.8 C) (Oral)   Ht $R'5\' 1"'QU$  (1.549 m)   Wt 122 lb (55.3 kg)   SpO2 100%   BMI 23.05 kg/m '  Examination  General - not in acute distress, comfortably sitting in chair HEENT - no pallor and no icterus, pupils reactive to light and accommodation Chest - b/l clear air entry, no additional sounds CVS- Normal s1s2, RRR Abdomen - Soft, Non tender , non distended Ext-  no pedal edema,   Neuro: awake, alert and oriented to time, place and person, negative romberg's sign, 2+ bilateral knee reflex.CN 2-12 intact, Bilateral sensation intact. Joint position sense and vibration sense intact bilaterally Back - WNL Psych : calm and cooperative Skin: tattoos in the upper extremities,small palpable lymph node in the left lateral neck; no lymphadenopathy in the bilateral axillae and bilateral groin. No erythema nodosum noted on bilateral shins   Recent labs Labs 11/07/20 Quantiferon negative CBC and CMP unremarkable  CBC Latest Ref Rng & Units 10/16/2019 08/01/2014 09/20/2013  WBC 3.4 - 10.8 x10E3/uL 7.8 13.1(H) 6.0  Hemoglobin 11.1 - 15.9 g/dL 10.3(L) 10.8(L) 11.0(L)  Hematocrit 34.0 - 46.6 % 31.2(L) 32.0(L) 33.4(L)  Platelets 150 - 450 x10E3/uL 226 187 181   CMP Latest Ref Rng & Units 05/20/2020 08/01/2014 09/20/2013  Glucose 65 - 99 mg/dL 84 117(H) 88  BUN 6 - 24 mg/dL 12 <5(L) 11  Creatinine 0.57 - 1.00 mg/dL 0.73 0.76 0.78  Sodium 134 - 144 mmol/L 138 131(L) 141  Potassium 3.5 - 5.2 mmol/L 3.9 3.1(L) 3.9  Chloride 96 - 106 mmol/L 104 98(L) 105  CO2 20 - 29 mmol/L $RemoveB'21 23 23  'gTaNjhTX$ Calcium 8.7 - 10.2 mg/dL 8.9 8.9 9.4  Total Protein 6.5 - 8.1 g/dL - 7.1 -  Total Bilirubin 0.3 - 1.2 mg/dL -  0.6 -  Alkaline Phos 38 - 126 U/L - 86 -  AST 15 - 41 U/L - 26 -  ALT 14 - 54 U/L - 23 -     Pertinent Microbiology Results for orders placed or performed during the hospital encounter of 01/27/19  Novel Coronavirus, NAA (Hosp order, Send-out to Ref Lab; TAT 18-24 hrs     Status: None   Collection Time: 01/27/19  8:41 AM   Specimen: Nasopharyngeal Swab; Respiratory  Result Value Ref Range Status   SARS-CoV-2, NAA NOT DETECTED NOT DETECTED Final    Comment: (NOTE) This nucleic acid amplification test was developed and its performance characteristics determined by [employer]. Nucleic acid amplification tests include PCR and TMA. This test has not been FDA cleared or  approved. This test has been authorized by FDA under an Emergency Use Authorization (EUA). This test is only authorized for the duration of time the declaration that circumstances exist justifying the authorization of the emergency use of in vitro diagnostic tests for detection of SARS-CoV-2 virus and/or diagnosis of COVID-19 infection under section 564(b)(1) of the Act, 21 U.S.C. 017PZW-2(H) (1), unless the authorization is terminated or revoked sooner. When diagnostic testing is negative, the possibility of a false negative result should be considered in the context of a patient's recent exposures and the presence of clinical signs and symptoms consistent with COVID-19. An individual without symptoms of COVID- 19 and who is not shedding SARS-CoV-2 vi rus would expect to have a negative (not detected) result in this assay. Performed At: [facility] [address] [place], [place] [id number] Rush Farmer MD PN:[id number]    [place]  Final    Comment: Performed at [facility], [place] [address], [place], [place] [address]    Pertinent Imaging All pertinent labs/Imagings/notes reviewed. All pertinent plain films and CT images have been personally visualized and interpreted; radiology reports have been reviewed. Decision making incorporated into the Impression / Recommendations.  I have spent a total of 60 minutes of face-to-face and non-face-to-face time, excluding clinical staff time, preparing to see patient, ordering tests and/or medications, and provide counseling the patient    Electronically signed by:  Rosiland Oz, MD Infectious Disease Physician [facility] [address] [place], [place] [address] Phone: [phone]  Fax: [phone]

## 2020-12-18 NOTE — Telephone Encounter (Signed)
Patient referred to Dr Elinor Parkinson for neurosyphilis treatment.  - Antibiotic orders faxed to [facility]. They are in triage if needed. - CT head without contrast scheduled for 12/23/20 at [facility]. Once this is cleared by Radiology, she will be scheduled by radiology scheduling for lumbar puncture. Paitent will hopefully get PICC placed same day. Left message with IR scheduling to request this Morrie Sheldon). Unsure which campus LP will be performed, [facility] or [facility]. [facility] scheduling: [id number], [facility] IR [id number]. Will need to follow. - Antibioitcs are continuous penicillin infusion. Will need to coordinate start of treatment/first dose with home health to avoid gaps in dosing.  Andree Coss, RN

## 2020-12-18 NOTE — Telephone Encounter (Signed)
Thank you Michelle.  

## 2020-12-20 ENCOUNTER — Other Ambulatory Visit: Payer: Self-pay | Admitting: Infectious Diseases

## 2020-12-20 ENCOUNTER — Encounter: Payer: Self-pay | Admitting: Infectious Diseases

## 2020-12-20 LAB — RPR TITER: RPR Titer: 1:16 {titer} — ABNORMAL HIGH

## 2020-12-20 LAB — HEPATITIS C ANTIBODY
Hepatitis C Ab: NONREACTIVE
SIGNAL TO CUT-OFF: 0.03 (ref <1.00)

## 2020-12-20 LAB — HIV ANTIBODY (ROUTINE TESTING W REFLEX): HIV 1&2 Ab, 4th Generation: NONREACTIVE

## 2020-12-20 LAB — FLUORESCENT TREPONEMAL AB(FTA)-IGG-BLD: Fluorescent Treponemal ABS: REACTIVE — AB

## 2020-12-20 LAB — HEPATITIS B SURFACE ANTIGEN: Hepatitis B Surface Ag: NONREACTIVE

## 2020-12-20 LAB — HEPATITIS B CORE ANTIBODY, TOTAL: Hep B Core Total Ab: NONREACTIVE

## 2020-12-20 LAB — HEPATITIS B SURFACE ANTIBODY,QUALITATIVE: Hep B S Ab: NONREACTIVE

## 2020-12-20 LAB — RPR: RPR Ser Ql: REACTIVE — AB

## 2020-12-20 NOTE — Progress Notes (Addendum)
I spoke with the patient and answered her questions regarding her syphilis testing. Her previous tire was 1:32 pre tx and now it is 1:16 post 3 doses of Benzathine penicillin which is appropriate response to tx. She tells me she has new onset memory loss and blurry vision due to which there is concern for Neurosyphilis and LP is indicated. She is willing to have an LP done and get tx for possible Neurosyphilis as previously planned.   Odette Fraction, MD Infectious Disease Physician [facility] [address] [place], [place] [address] Phone: [phone]  Fax: [phone]

## 2020-12-23 ENCOUNTER — Ambulatory Visit (HOSPITAL_BASED_OUTPATIENT_CLINIC_OR_DEPARTMENT_OTHER)
Admission: RE | Admit: 2020-12-23 | Discharge: 2020-12-23 | Disposition: A | Discharge status: Home / Self Care | Payer: Medicaid Other | Source: Ambulatory Visit | Attending: Infectious Diseases | Admitting: Infectious Diseases

## 2020-12-23 ENCOUNTER — Other Ambulatory Visit: Payer: Self-pay

## 2020-12-23 DIAGNOSIS — A523 Neurosyphilis, unspecified: Secondary | ICD-10-CM | POA: Insufficient documentation

## 2020-12-23 NOTE — Telephone Encounter (Signed)
Lumbar puncture will have to be done at [facility]. Appointment made for 9/29 at 8:45 am. Earliest available PICC placement appointment is 10/3 at 8:45 am. Patient made aware of both appointment times & locations and is agreeable. RN notified Jeri Modena with [facility]. Patient knows she is to go to short stay for first dose after PICC placement.   Sandie Ano, RN

## 2020-12-26 ENCOUNTER — Ambulatory Visit
Admission: RE | Admit: 2020-12-26 | Discharge: 2020-12-26 | Disposition: A | Discharge status: Home / Self Care | Payer: Medicaid Other | Source: Ambulatory Visit | Attending: Infectious Diseases | Admitting: Infectious Diseases

## 2020-12-26 ENCOUNTER — Other Ambulatory Visit (HOSPITAL_COMMUNITY)
Admission: RE | Admit: 2020-12-26 | Discharge: 2020-12-26 | Disposition: A | Discharge status: Home / Self Care | Payer: Medicaid Other | Source: Ambulatory Visit | Attending: Infectious Diseases | Admitting: Infectious Diseases

## 2020-12-26 ENCOUNTER — Other Ambulatory Visit: Payer: Self-pay

## 2020-12-26 VITALS — BP 126/79 | HR 66

## 2020-12-26 DIAGNOSIS — Z113 Encounter for screening for infections with a predominantly sexual mode of transmission: Secondary | ICD-10-CM

## 2020-12-26 DIAGNOSIS — F17200 Nicotine dependence, unspecified, uncomplicated: Secondary | ICD-10-CM

## 2020-12-26 DIAGNOSIS — N939 Abnormal uterine and vaginal bleeding, unspecified: Secondary | ICD-10-CM

## 2020-12-26 DIAGNOSIS — A523 Neurosyphilis, unspecified: Secondary | ICD-10-CM | POA: Diagnosis present

## 2020-12-26 DIAGNOSIS — B9689 Other specified bacterial agents as the cause of diseases classified elsewhere: Secondary | ICD-10-CM

## 2020-12-26 DIAGNOSIS — N946 Dysmenorrhea, unspecified: Secondary | ICD-10-CM

## 2020-12-26 DIAGNOSIS — R0600 Dyspnea, unspecified: Secondary | ICD-10-CM

## 2020-12-26 NOTE — Telephone Encounter (Signed)
Orders faxed to short stay 12/25/20.   Sandie Ano, RN

## 2020-12-26 NOTE — Discharge Instructions (Signed)
Lumbar Puncture Discharge Instructions  Go home and rest quietly as needed. You may resume normal activities; however, do not exert yourself strongly or do any heavy lifting today and tomorrow.   DO NOT drive today.    You may resume your normal diet and medications unless otherwise indicated. Drink a lot of extra fluids today and tomorrow.   The incidence of a spinal headache is about 5% (one in 20 patients).  If you develop a headache when you are sitting up or standing that gets better when you lie down, please lie flat for 24 hours and drink plenty of fluids until the headache goes away.  Caffeinated beverages may be helpful.   If you develop severe nausea and vomiting or a headache that does not go away with the flat bedrest after 48 hours, please call [phone].   Call your physician for a follow-up appointment.  The results of your myelogram will be sent directly to your physician by the following day.  Please call us at [phone] if you have any questions or if complications develop after you arrive home.   Discharge instructions have been explained to the patient.  The patient, or the person responsible for the patient, state they fully understands these instructions.   Thank you for visiting our office today.    

## 2020-12-27 ENCOUNTER — Other Ambulatory Visit (HOSPITAL_COMMUNITY): Payer: Self-pay | Admitting: *Deleted

## 2020-12-27 LAB — CYTOLOGY - NON PAP

## 2020-12-30 ENCOUNTER — Other Ambulatory Visit: Payer: Self-pay

## 2020-12-30 ENCOUNTER — Encounter (HOSPITAL_COMMUNITY)
Admission: RE | Admit: 2020-12-30 | Discharge: 2020-12-30 | Disposition: A | Discharge status: Home / Self Care | Payer: Medicaid Other | Source: Ambulatory Visit | Attending: Infectious Diseases | Admitting: Infectious Diseases

## 2020-12-30 ENCOUNTER — Ambulatory Visit (HOSPITAL_COMMUNITY)
Admission: RE | Admit: 2020-12-30 | Discharge: 2020-12-30 | Disposition: A | Discharge status: Home / Self Care | Payer: Medicaid Other | Source: Ambulatory Visit | Attending: Infectious Diseases | Admitting: Infectious Diseases

## 2020-12-30 DIAGNOSIS — A523 Neurosyphilis, unspecified: Secondary | ICD-10-CM | POA: Insufficient documentation

## 2020-12-30 LAB — CSF CELL COUNT WITH DIFFERENTIAL
RBC Count, CSF: 0 cells/uL
WBC, CSF: 0 cells/uL (ref 0–5)

## 2020-12-30 LAB — CSF CULTURE W GRAM STAIN
MICRO NUMBER:: 12439648
Result:: NO GROWTH
SPECIMEN QUALITY:: ADEQUATE

## 2020-12-30 LAB — VDRL, CSF: VDRL Quant, CSF: NONREACTIVE

## 2020-12-30 LAB — GLUCOSE, CSF: Glucose, CSF: 61 mg/dL (ref 40–80)

## 2020-12-30 LAB — PROTEIN, CSF: Total Protein, CSF: 43 mg/dL (ref 15–45)

## 2020-12-30 MED ORDER — LIDOCAINE HCL 1 % IJ SOLN
INTRAMUSCULAR | Status: AC
  Administered 2020-12-30: 10 mL
  Filled 2020-12-30: qty 20

## 2020-12-30 MED ORDER — HEPARIN SOD (PORK) LOCK FLUSH 100 UNIT/ML IV SOLN
INTRAVENOUS | Status: AC
  Administered 2020-12-30: 500 [IU]
  Filled 2020-12-30: qty 5

## 2020-12-30 MED ORDER — PENICILLIN G POTASSIUM 20000000 UNITS IJ SOLR
4.0000 10*6.[IU] | Freq: Once | INTRAVENOUS | Status: AC
  Administered 2020-12-30: 4 10*6.[IU] via INTRAVENOUS
  Filled 2020-12-30: qty 4

## 2020-12-30 MED ORDER — PENICILLIN G POTASSIUM 20000000 UNITS IJ SOLR: 24.0000 10*6.[IU] | Freq: Two times a day (BID) | INTRAVENOUS | Status: DC

## 2020-12-30 MED ORDER — LIDOCAINE HCL 1 % IJ SOLN
INTRAMUSCULAR | Status: DC | PRN
  Administered 2020-12-30: 10 mL

## 2020-12-30 MED ORDER — HEPARIN SOD (PORK) LOCK FLUSH 100 UNIT/ML IV SOLN
INTRAVENOUS | Status: AC
  Administered 2020-12-30: 250 [IU]
  Filled 2020-12-30: qty 5

## 2020-12-30 NOTE — Procedures (Signed)
PROCEDURE SUMMARY:  Successful placement of image-guided single lumen PICC line to the right basilic vein. Length 30 cm. Tip at lower SVC/RA. No complications. EBL = <3 ml. Ready for use.  Please see imaging section of Epic for full dictation.   Mickie Kay, NP 12/30/2020 9:25 AM

## 2021-01-13 ENCOUNTER — Ambulatory Visit: Payer: Medicaid Other | Admitting: Infectious Disease

## 2021-01-17 ENCOUNTER — Encounter: Payer: Self-pay | Admitting: Infectious Disease

## 2021-01-17 ENCOUNTER — Ambulatory Visit (INDEPENDENT_AMBULATORY_CARE_PROVIDER_SITE_OTHER): Payer: Medicaid Other | Admitting: Infectious Disease

## 2021-01-17 ENCOUNTER — Other Ambulatory Visit (HOSPITAL_COMMUNITY): Payer: Self-pay

## 2021-01-17 ENCOUNTER — Other Ambulatory Visit: Payer: Self-pay

## 2021-01-17 VITALS — BP 108/71 | HR 68 | Resp 16 | Ht 61.0 in | Wt 120.0 lb

## 2021-01-17 DIAGNOSIS — A5149 Other secondary syphilitic conditions: Secondary | ICD-10-CM | POA: Diagnosis not present

## 2021-01-17 DIAGNOSIS — Z113 Encounter for screening for infections with a predominantly sexual mode of transmission: Secondary | ICD-10-CM | POA: Diagnosis not present

## 2021-01-17 DIAGNOSIS — A523 Neurosyphilis, unspecified: Secondary | ICD-10-CM

## 2021-01-17 DIAGNOSIS — Z79899 Other long term (current) drug therapy: Secondary | ICD-10-CM

## 2021-01-17 DIAGNOSIS — H538 Other visual disturbances: Secondary | ICD-10-CM | POA: Diagnosis not present

## 2021-01-17 DIAGNOSIS — F17200 Nicotine dependence, unspecified, uncomplicated: Secondary | ICD-10-CM

## 2021-01-17 DIAGNOSIS — M359 Systemic involvement of connective tissue, unspecified: Secondary | ICD-10-CM | POA: Insufficient documentation

## 2021-01-17 DIAGNOSIS — Z7185 Encounter for immunization safety counseling: Secondary | ICD-10-CM | POA: Diagnosis not present

## 2021-01-17 DIAGNOSIS — M329 Systemic lupus erythematosus, unspecified: Secondary | ICD-10-CM

## 2021-01-17 DIAGNOSIS — IMO0002 Reserved for concepts with insufficient information to code with codable children: Secondary | ICD-10-CM

## 2021-01-17 HISTORY — DX: Reserved for concepts with insufficient information to code with codable children: IMO0002

## 2021-01-17 HISTORY — DX: Systemic lupus erythematosus, unspecified: M32.9

## 2021-01-17 MED ORDER — EMTRICITABINE-TENOFOVIR DF 200-300 MG PO TABS
1.0000 | ORAL_TABLET | Freq: Every day | ORAL | 0 refills | Status: DC
  Filled 2021-01-17 – 2021-01-18 (×2): qty 30, 30d supply, fill #0

## 2021-01-17 NOTE — Progress Notes (Signed)
Subjective:  Chief complaint: continued problems with vision that have worsened   Patient ID: Dominique [name], female    DOB: December 01, 1975, 45 y.o.   MRN: [id number]  HPI  Dominique [name] is a 45 year old Black woman who was recently diagnosed with secondary syphilis with lymphadenopathy and diffuse rash.  In addition to this she also endorsed symptoms of problems with vision and recent problems with memory.  Due to these concerns for ocular syphilis and neurosyphilis she underwent lumbar puncture with CSF analysis that was not consistent with neurosyphilis though certainly ocular syphilis needed to still be considered and treated.  She has now completed 2 weeks of intravenous penicillin.  Unfortunately she has not had improvement in her vision at all.  She is also not been seen by ophthalmology but clearly does need to be seen.  She is also been referred to Dr. Orest Dikes with rheumatology due to a positive ANA and CCP in the context of multiple joint pains and skin changes.  We discussed risk for HIV and my strong feeling that she would benefit from being on preexposure prophylaxis against HIV.     Past Medical History:  Diagnosis Date   Kidney stones     Past Surgical History:  Procedure Laterality Date   CESAREAN SECTION     HERNIA REPAIR      Family History  Problem Relation Age of Onset   Hypertension Father    Diabetes Father    Asthma Brother       Social History   Socioeconomic History   Marital status: Single    Spouse name: Not on file   Number of children: Not on file   Years of education: Not on file   Highest education level: Not on file  Occupational History   Not on file  Tobacco Use   Smoking status: Every Day    Packs/day: 0.25    Types: Cigarettes   Smokeless tobacco: Never  Substance and Sexual Activity   Alcohol use: Yes    Alcohol/week: 2.0 standard drinks    Types: 2 Standard drinks or equivalent per week    Comment: occasional   Drug use:  Yes    Types: Marijuana   Sexual activity: Yes    Partners: Male    Birth control/protection: Surgical, Injection  Other Topics Concern   Not on file  Social History Narrative   Not on file   Social Determinants of Health   Financial Resource Strain: Not on file  Food Insecurity: Not on file  Transportation Needs: Not on file  Physical Activity: Not on file  Stress: Not on file  Social Connections: Not on file    No Known Allergies   Current Outpatient Medications:    buPROPion (WELLBUTRIN XL) 150 MG 24 hr tablet, Take 150 mg by mouth daily., Disp: , Rfl:    cetirizine (ZYRTEC) 10 MG tablet, Take 10 mg by mouth daily., Disp: , Rfl:    diclofenac Sodium (VOLTAREN) 1 % GEL, Apply 2 g topically 4 (four) times daily., Disp: 150 g, Rfl: 1   gabapentin (NEURONTIN) 300 MG capsule, Take 300 mg by mouth 3 (three) times daily., Disp: , Rfl:    meloxicam (MOBIC) 15 MG tablet, Take 1 tablet (15 mg total) by mouth daily., Disp: 30 tablet, Rfl: 0   QUEtiapine (SEROQUEL) 100 MG tablet, , Disp: , Rfl:    DEPO-SUBQ PROVERA 104 104 MG/0.65ML injection, SMARTSIG:Syringe(s) SUB-Q, Disp: , Rfl:    nicotine polacrilex (  NICORETTE) 2 MG gum, Take 1 each (2 mg total) by mouth as needed for smoking cessation. (Patient not taking: Reported on 01/17/2021), Disp: 100 tablet, Rfl: 0   Review of Systems  Constitutional:  Negative for activity change, appetite change, chills, diaphoresis, fatigue, fever and unexpected weight change.  HENT:  Negative for congestion, hearing loss, mouth sores, nosebleeds, postnasal drip, rhinorrhea, sinus pressure, sneezing, sore throat and trouble swallowing.   Eyes:  Positive for visual disturbance. Negative for photophobia.  Respiratory:  Negative for cough, chest tightness, shortness of breath, wheezing and stridor.   Cardiovascular:  Negative for chest pain, palpitations and leg swelling.  Gastrointestinal:  Negative for abdominal distention, abdominal pain, anal  bleeding, blood in stool, constipation, diarrhea, nausea and vomiting.  Genitourinary:  Negative for difficulty urinating, dysuria, flank pain and hematuria.  Musculoskeletal:  Negative for arthralgias, back pain, gait problem, joint swelling and myalgias.  Skin:  Positive for rash. Negative for color change, pallor and wound.  Neurological:  Negative for dizziness, tremors, weakness, light-headedness and headaches.  Hematological:  Negative for adenopathy. Does not bruise/bleed easily.  Psychiatric/Behavioral:  Negative for agitation, behavioral problems, confusion, decreased concentration, dysphoric mood, sleep disturbance and suicidal ideas.       Objective:   Physical Exam Constitutional:      General: She is not in acute distress.    Appearance: Normal appearance. She is well-developed. She is not ill-appearing or diaphoretic.  HENT:     Head: Normocephalic and atraumatic.     Right Ear: Hearing and external ear normal.     Left Ear: Hearing and external ear normal.     Nose: No nasal deformity or rhinorrhea.  Eyes:     General: No scleral icterus.    Conjunctiva/sclera: Conjunctivae normal.     Right eye: Right conjunctiva is not injected.     Left eye: Left conjunctiva is not injected.     Pupils: Pupils are equal, round, and reactive to light.  Neck:     Vascular: No JVD.  Cardiovascular:     Rate and Rhythm: Normal rate and regular rhythm.     Heart sounds: S1 normal and S2 normal.  Pulmonary:     Effort: Pulmonary effort is normal. No respiratory distress.     Breath sounds: No wheezing.  Abdominal:     General: There is no distension.     Palpations: Abdomen is soft.  Musculoskeletal:        General: Normal range of motion.     Right shoulder: Normal.     Left shoulder: Normal.     Cervical back: Normal range of motion and neck supple.     Right hip: Normal.     Left hip: Normal.     Right knee: Normal.     Left knee: Normal.  Lymphadenopathy:     Head:      Right side of head: No submandibular, preauricular or posterior auricular adenopathy.     Left side of head: No submandibular, preauricular or posterior auricular adenopathy.     Cervical: No cervical adenopathy.     Right cervical: No superficial or deep cervical adenopathy.    Left cervical: No superficial or deep cervical adenopathy.  Skin:    General: Skin is warm and dry.     Coloration: Skin is not pale.     Findings: No abrasion, bruising, ecchymosis, erythema, lesion or rash.     Nails: There is no clubbing.  Neurological:  General: No focal deficit present.     Mental Status: She is alert and oriented to person, place, and time.     Sensory: No sensory deficit.     Coordination: Coordination normal.     Gait: Gait normal.  Psychiatric:        Attention and Perception: She is attentive.        Mood and Affect: Mood normal.        Speech: Speech normal.        Behavior: Behavior normal. Behavior is cooperative.        Thought Content: Thought content normal.        Judgment: Judgment normal.    Has areas of hypopigmentation on her forehead.  She has residual areas where she had her secondary syphilis rash on her arms present.      Assessment & Plan:   Secondary syphilis with there having been also concern for ocular syphilis:  She is completed treatment for ocular syphilis and had no improvement in her vision.  We will follow-up syphilis titers in the next visits.  I have reviewed her labs from December 27, 2018 when she had lumbar puncture with an unremarkable LP with glucose of 61 protein of 43 0 white blood cells negative VDRL on CSF and negative CSF culture.  Need for preexposure prophylaxis against HIV:  She has the following risk factors that put her at quite high risk, recent diagnosis of syphilis, residing in the [place] and being [employer]  I have been checking HIV antibody today and initiating Truvada.  We will endeavor to get her  aptitude which has been more effective in clinical studies then Truvada largely due to it being directly observed therapy and not relying on a person needing to take daily therapy.  Counseled her about the signs and symptoms of acute HIV since he is very important for her to know in case she ever develops and that we immediately see her in tester.  I have also explained how we would need to do an HIV rapid test before doing an HIV RNA in the context of initiating  Apretude  Vision loss: She needs to be seen by ophthalmology urgently so that a diagnosis that can be treated can be pursued since I do not think she has ocular syphilis given the fact that she did not have improvement despite 2 weeks of parenteral penicillin.  Memory problems: Not sure what is causing this but she did not have evidence of neurosyphilis by lumbar puncture and she has now received treatment for neurosyphilis in any case.  Possible lupus she is going to be seeing Dr. Dimple Casey shortly.  Vaccine counseling I counseled her that be good for her to be vaccine against COVID-19 as well as seasonal flu.

## 2021-01-18 ENCOUNTER — Other Ambulatory Visit (HOSPITAL_COMMUNITY): Payer: Self-pay

## 2021-01-20 LAB — HIV ANTIBODY (ROUTINE TESTING W REFLEX): HIV 1&2 Ab, 4th Generation: NONREACTIVE

## 2021-01-20 NOTE — Progress Notes (Signed)
Patient ID: Dominique [name], female   DOB: 1975-05-05, 45 y.o.   MRN: [id number] [facility] at [phone] about referral faxed on 12-24-20  spoke with French Ana who advised they called patient left message for her to call to schedule on 12-30-20 and she has not returned the call

## 2021-01-21 ENCOUNTER — Other Ambulatory Visit (HOSPITAL_COMMUNITY): Payer: Self-pay

## 2021-01-21 ENCOUNTER — Encounter: Payer: Self-pay | Admitting: Pharmacist

## 2021-01-21 ENCOUNTER — Other Ambulatory Visit: Payer: Self-pay | Admitting: Pharmacist

## 2021-01-21 ENCOUNTER — Telehealth: Payer: Self-pay

## 2021-01-21 DIAGNOSIS — Z79899 Other long term (current) drug therapy: Secondary | ICD-10-CM

## 2021-01-21 MED ORDER — APRETUDE 600 MG/3ML IM SUER
600.0000 mg | INTRAMUSCULAR | 5 refills | Status: DC
Start: 2021-01-21 — End: 2021-07-29
  Filled 2021-01-21 – 2021-04-01 (×2): qty 3, 60d supply, fill #0
  Filled 2021-06-13: qty 3, 60d supply, fill #1

## 2021-01-21 MED ORDER — APRETUDE 600 MG/3ML IM SUER
600.0000 mg | INTRAMUSCULAR | 1 refills | Status: DC
Start: 2021-01-21 — End: 2021-07-29
  Filled 2021-01-21 – 2021-01-22 (×2): qty 3, 30d supply, fill #0
  Filled 2021-02-14: qty 3, 30d supply, fill #1

## 2021-01-21 NOTE — Telephone Encounter (Signed)
[facility] Patient Advocate Encounter  Apretude is covered by Pharmacy Benefits.  Prescription can be filled at [facility] copay $4.00    Clearance Coots, CPhT Specialty Pharmacy Patient [facility] for Infectious Disease Phone: [phone] Fax:  [phone]

## 2021-01-22 ENCOUNTER — Other Ambulatory Visit (HOSPITAL_COMMUNITY): Payer: Self-pay

## 2021-01-23 ENCOUNTER — Other Ambulatory Visit: Payer: Self-pay

## 2021-01-23 ENCOUNTER — Telehealth: Payer: Self-pay

## 2021-01-23 ENCOUNTER — Ambulatory Visit: Payer: Medicaid Other | Admitting: Internal Medicine

## 2021-01-23 ENCOUNTER — Encounter: Payer: Self-pay | Admitting: Internal Medicine

## 2021-01-23 VITALS — BP 103/64 | HR 81 | Resp 14 | Ht 61.0 in | Wt 124.0 lb

## 2021-01-23 DIAGNOSIS — A5149 Other secondary syphilitic conditions: Secondary | ICD-10-CM

## 2021-01-23 DIAGNOSIS — R21 Rash and other nonspecific skin eruption: Secondary | ICD-10-CM

## 2021-01-23 DIAGNOSIS — R768 Other specified abnormal immunological findings in serum: Secondary | ICD-10-CM | POA: Diagnosis not present

## 2021-01-23 DIAGNOSIS — M2550 Pain in unspecified joint: Secondary | ICD-10-CM | POA: Diagnosis not present

## 2021-01-23 DIAGNOSIS — B009 Herpesviral infection, unspecified: Secondary | ICD-10-CM | POA: Insufficient documentation

## 2021-01-23 HISTORY — DX: Rash and other nonspecific skin eruption: R21

## 2021-01-23 NOTE — Progress Notes (Signed)
Office Visit Note  Patient: Dominique [name]             Date of Birth: May 05, 1975           MRN: [id number]             PCP: Dot Been, FNP Referring: Verlon Au, MD Visit Date: 01/23/2021 Occupation: Retail/cashier  Subjective:  New Patient (Initial Visit) (Abnormal labs, patient has not had COVID vaccines)   History of Present Illness: Dominique [name] is a 45 y.o. female here for evaluation of positive ANA and elevated sedimentation rate with multiple systemic symptoms including weight loss, lymphadenopathy, paresthesias, joint pain, and headaches.  She started noticing hyperpigmented skin rashes developing on her face and extremities since several months ago does not recall specific onset or provoking episode.  Then developed worsening joint pain in multiple sites but started around July of this year.  This involved pain affecting both hands also the hips knees and feet bilaterally.  She described morning stiffness lasting 2 or 3 hours in duration also especially pain in the bottoms of the feet first and getting out of bed.  After a month with these ongoing and somewhat worsening symptoms she was seen in primary care clinic. Workup at that time positive for trichomoniasis and syphilis and starting antibiotics also found to have high sedimentation rate with positive ANA and CCP Abs. She followed up for continued treatment last month and more recently in ID clinic with completion of antibiotic treatment for syphilis with Bicillin.  Neurology evaluation with lumbar puncture that was not suggestive for active neurosyphilis. She saw ophthalmology for visual change symptoms reports findings were not concerning for infectious or inflammatory type changes.   Labs  10/2020 ANA pos RF neg CCP 34 ESR 56 Uric acid 3.3 CBC wnl CMP cnl RPR 1:32 / tpa pos Vit D 25.8 HIV neg TB neg   Activities of Daily Living:  Patient reports morning stiffness for 3 hours.   Patient Reports  nocturnal pain.  Difficulty dressing/grooming: Reports Difficulty climbing stairs: Denies Difficulty getting out of chair: Reports Difficulty using hands for taps, buttons, cutlery, and/or writing: Reports  Review of Systems  Constitutional:  Positive for fatigue.  HENT:  Positive for mouth dryness.   Eyes:  Positive for dryness.  Respiratory:  Positive for shortness of breath.   Cardiovascular:  Positive for swelling in legs/feet.  Gastrointestinal:  Positive for constipation and diarrhea.  Endocrine: Positive for cold intolerance, heat intolerance, excessive thirst and increased urination.  Genitourinary:  Negative for difficulty urinating.  Musculoskeletal:  Positive for joint pain, joint pain, joint swelling, muscle weakness, morning stiffness and muscle tenderness.  Skin:  Positive for color change and rash.  Allergic/Immunologic: Positive for susceptible to infections.  Neurological:  Positive for numbness and weakness.  Hematological:  Negative for bruising/bleeding tendency.  Psychiatric/Behavioral:  Positive for sleep disturbance.    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   No-show for appointment 05/21/2020   Right hip pain 05/16/2020   BRBPR (bright red blood per rectum) 10/18/2019   Tobacco use disorder 07/31/2019   Dyspnea, paroxysmal nocturnal 07/31/2019   Schizophrenia (HCC) 07/31/2019  Encounter for screening examination for sexually transmitted disease 07/31/2019   Abnormal uterine bleeding (AUB) 05/09/2013   BV (bacterial vaginosis) 05/09/2013   Dysmenorrhea 05/09/2013    Past Medical History:  Diagnosis Date   Kidney stones     Lupus ([place]) 01/17/2021   Systemic lupus erythematosus ([place])     Family History  Problem Relation Age of Onset   Hypertension Father    Diabetes Father    Asthma Brother    Past Surgical History:  Procedure Laterality Date   CESAREAN SECTION     HERNIA REPAIR     Social History   Social History Narrative   Not on file    There is no immunization history on file for this patient.   Objective: Vital Signs: BP 103/64 (BP Location: Right Arm, Patient Position: Sitting, Cuff Size: Normal)   Pulse 81   Resp 14   Ht $R'5\' 1"'wY$  (1.549 m)   Wt 124 lb (56.2 kg)   BMI 23.43 kg/m    Physical Exam HENT:     Right Ear: External ear normal.     Left Ear: External ear normal.     Mouth/Throat:     Mouth: Mucous membranes are moist.     Pharynx: Oropharynx is clear.  Eyes:     Conjunctiva/sclera: Conjunctivae normal.  Cardiovascular:     Rate and Rhythm: Normal rate and regular rhythm.  Pulmonary:     Effort: Pulmonary effort is normal.     Breath sounds: Normal breath sounds.  Musculoskeletal:     Right lower leg: No edema.     Left lower leg: No edema.  Lymphadenopathy:     Cervical: Cervical adenopathy present.  Skin:    General: Skin is warm and dry.     Comments: Hypipigmentated rash around periphery of face on forehead and small patches over cheeks with irregular borders, no central facial edema Normal nailfold capillaries bilateral No nail or digital pitting or discoloration  Neurological:     Mental Status: She is alert.     Deep Tendon Reflexes: Reflexes normal.  Psychiatric:        Mood and Affect: Mood normal.     Musculoskeletal Exam:  Neck full ROM Shoulders full ROM no tenderness or swelling Elbows full ROM no tenderness or swelling Wrists full ROM no tenderness or swelling Fingers full ROM no tenderness or swelling Hip normal internal and external rotation without pain, no tenderness to lateral hip palpation Knees full ROM no tenderness or  swelling Ankles full ROM no tenderness or swelling MTPs full ROM no tenderness or swelling, tenderness to pressure on plantar surface under MTP joints without visible or palpable changes    Investigation: No additional findings.  Imaging: IR PICC PLACEMENT LEFT >5 YRS INC IMG GUIDE  Result Date: 12/30/2020 INDICATION: Patient with suspected neurosyphilis requires long-term IV antibiotics. Interventional radiology asked place a PICC. EXAM: ULTRASOUND AND FLUOROSCOPIC GUIDED PICC LINE INSERTION MEDICATIONS: 1% lidocaine, 1 mL CONTRAST:  None FLUOROSCOPY TIME:  12 seconds (0 mGy) COMPLICATIONS: None immediate. TECHNIQUE: The procedure, risks, benefits, and alternatives were explained to the patient and informed written consent was obtained. The right upper extremity was prepped with chlorhexidine in a sterile fashion, and a sterile drape was applied covering the operative field. Maximum barrier sterile technique with sterile gowns and gloves were used for the procedure. A timeout was performed prior to the initiation of the procedure. Local anesthesia was provided with 1% lidocaine. After the overlying soft tissues were  anesthetized with 1% lidocaine, a micropuncture kit was utilized to access the right basilic vein. Real-time ultrasound guidance was utilized for vascular access including the acquisition of a permanent ultrasound image documenting patency of the accessed vessel. A guidewire was advanced to the level of the superior caval-atrial junction for measurement purposes and the PICC line was cut to length. A peel-away sheath was placed and a 30 cm, 5 [place], single lumen was inserted to level of the superior caval-atrial junction. A post procedure spot fluoroscopic was obtained. The catheter easily aspirated and flushed and was secured in place. A dressing was placed. The patient tolerated the procedure well without immediate post procedural complication. FINDINGS: After catheter placement, the tip  lies within the superior cavoatrial junction. The catheter aspirates and flushes normally and is ready for immediate use. IMPRESSION: Successful ultrasound and fluoroscopic guided placement of a right basilic vein approach, 30 cm, 5 [place], single lumen PICC with tip at the superior caval-atrial junction. The PICC line is ready for immediate use. Read by: Soyla Dryer, NP Electronically Signed   By: Ruthann Cancer M.D.   On: 12/30/2020 09:28   DG FL GUIDED LUMBAR PUNCTURE  Result Date: 12/26/2020 CLINICAL DATA:  Neurosyphilis. EXAM: DIAGNOSTIC LUMBAR PUNCTURE UNDER FLUOROSCOPIC GUIDANCE COMPARISON:  None FLUOROSCOPY TIME:  Fluoroscopy Time:  4 seconds Radiation Exposure Index (if provided by the fluoroscopic device): 1.85 microGray*m^2 Number of Acquired Spot Images: 0 PROCEDURE: Informed consent was obtained from the patient prior to the procedure, including potential complications of headache, allergy, and pain. With the patient prone, the lower back was prepped with Betadine. 1% Lidocaine was used for local anesthesia. Lumbar puncture was performed at the L4-5 level using a 3.5 inch 20 gauge needle via a right interlaminar approach with return of clear CSF with an opening pressure of 13 cm water (measured in the left lateral decubitus position). 8 mL of CSF were obtained for laboratory studies. The patient tolerated the procedure well and there were no apparent complications. IMPRESSION: Technically successful fluoroscopically guided lumbar puncture. Electronically Signed   By: Logan Bores M.D.   On: 12/26/2020 10:56    Recent Labs: Lab Results  Component Value Date   WBC 7.8 10/16/2019   HGB 10.3 (L) 10/16/2019   PLT 226 10/16/2019   NA 138 05/20/2020   K 3.9 05/20/2020   CL 104 05/20/2020   CO2 21 05/20/2020   GLUCOSE 84 05/20/2020   BUN 12 05/20/2020   CREATININE 0.73 05/20/2020   BILITOT 0.6 08/01/2014   ALKPHOS 86 08/01/2014   AST 26 08/01/2014   ALT 23 08/01/2014   PROT 7.1  08/01/2014   ALBUMIN 3.3 (L) 08/01/2014   CALCIUM 8.9 05/20/2020   GFRAA 117 05/20/2020    Speciality Comments: No specialty comments available.  Procedures:  No procedures performed Allergies: Patient has no known allergies.   Assessment / Plan:     Visit Diagnoses: Positive ANA (antinuclear antibody) - Plan: Anti-scleroderma antibody, RNP Antibody, Anti-Smith antibody, Sjogrens syndrome-A extractable nuclear antibody, Anti-DNA antibody, double-stranded, C3 and C4, Protein / creatinine ratio, urine, Sedimentation rate  Positive ANA with hyperpigmentation skin changes and joint pain in multiple areas also painful cervical adenopathy and unintentional weight loss questionably meeting clinical criteria.  We will check serology also urinalysis and protein creatinine ratio screening for renal involvement with small protein on past UA.  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  Could still have  residual inflammatory changes but fortunately work-up including neurology and ophthalmology not showing multiorgan system disease.  Polyarthralgia   Joint pain in multiple sites her worst affected area is actually in the balls of the feet on both side there are no specific inflammatory joint changes present on exam today.  Laboratory work-up as above.  Joint pain could be associated with the recent infection as well.  Reactive arthritis after recent STI treatment also possible but presentation is not typical of this.  Rash and other nonspecific skin eruption   Rash is atypical for lupus or similar connective tissue disease today's changes look most like melasma.  Orders: Orders Placed This Encounter  Procedures   Anti-scleroderma antibody   RNP Antibody   Anti-Smith antibody   Sjogrens syndrome-A extractable nuclear antibody   Anti-DNA antibody, double-stranded   C3 and C4   Protein /  creatinine ratio, urine   Sedimentation rate    No orders of the defined types were placed in this encounter.    Follow-Up Instructions: Return in about 2 weeks (around 02/06/2021) for New pt +ANA/arthritis/SS f/u 2wks.   Collier Salina, MD  Note - This record has been created using [employer].  Chart creation errors have been sought, but may not always  have been located. Such creation errors do not reflect on  the standard of medical care.

## 2021-01-23 NOTE — Telephone Encounter (Signed)
[facility] Patient Advocate Encounter  Patient's medication(Apretude) have been couriered to [facility] from [facility] and will be administered on patient next office visit on 01/30/21.  Pharmacy Benefits (patient supplied)  Clearance Coots , CPhT Specialty Pharmacy Patient [facility] for Infectious Disease Phone: [phone] Fax:  [phone]

## 2021-01-24 LAB — PROTEIN / CREATININE RATIO, URINE
Creatinine, Urine: 264 mg/dL (ref 20–275)
Protein/Creat Ratio: 189 mg/g creat — ABNORMAL HIGH (ref 24–184)
Protein/Creatinine Ratio: 0.189 mg/mg creat — ABNORMAL HIGH (ref 0.024–0.184)
Total Protein, Urine: 50 mg/dL — ABNORMAL HIGH (ref 5–24)

## 2021-01-24 LAB — ANTI-DNA ANTIBODY, DOUBLE-STRANDED: ds DNA Ab: <1 IU/mL

## 2021-01-24 LAB — C3 AND C4
C3 Complement: 142 mg/dL (ref 83–193)
C4 Complement: 28 mg/dL (ref 15–57)

## 2021-01-24 LAB — SEDIMENTATION RATE: Sed Rate: 36 mm/h — ABNORMAL HIGH (ref 0–20)

## 2021-01-24 LAB — RNP ANTIBODY: Ribonucleic Protein(ENA) Antibody, IgG: <1 AI

## 2021-01-24 LAB — SJOGRENS SYNDROME-A EXTRACTABLE NUCLEAR ANTIBODY: SSA (Ro) (ENA) Antibody, IgG: <1 AI

## 2021-01-24 LAB — ANTI-SCLERODERMA ANTIBODY: Scleroderma (Scl-70) (ENA) Antibody, IgG: <1 AI

## 2021-01-24 LAB — ANTI-SMITH ANTIBODY: ENA SM Ab Ser-aCnc: <1 AI

## 2021-01-27 ENCOUNTER — Other Ambulatory Visit: Payer: Self-pay | Admitting: Pharmacist

## 2021-01-27 DIAGNOSIS — Z79899 Other long term (current) drug therapy: Secondary | ICD-10-CM

## 2021-01-29 ENCOUNTER — Encounter: Payer: Self-pay | Admitting: Internal Medicine

## 2021-01-30 ENCOUNTER — Ambulatory Visit (INDEPENDENT_AMBULATORY_CARE_PROVIDER_SITE_OTHER): Payer: Medicaid Other | Admitting: Pharmacist

## 2021-01-30 ENCOUNTER — Other Ambulatory Visit: Payer: Self-pay

## 2021-01-30 ENCOUNTER — Other Ambulatory Visit (HOSPITAL_COMMUNITY)
Admission: RE | Admit: 2021-01-30 | Discharge: 2021-01-30 | Disposition: A | Discharge status: Home / Self Care | Payer: Medicaid Other | Source: Ambulatory Visit | Attending: Infectious Diseases | Admitting: Infectious Diseases

## 2021-01-30 ENCOUNTER — Other Ambulatory Visit: Payer: Medicaid Other

## 2021-01-30 DIAGNOSIS — Z79899 Other long term (current) drug therapy: Secondary | ICD-10-CM | POA: Diagnosis not present

## 2021-01-30 DIAGNOSIS — Z23 Encounter for immunization: Secondary | ICD-10-CM

## 2021-01-30 MED ORDER — CABOTEGRAVIR ER 600 MG/3ML IM SUER
600.0000 mg | Freq: Once | INTRAMUSCULAR | Status: AC
  Administered 2021-01-30: 600 mg via INTRAMUSCULAR

## 2021-01-30 NOTE — Progress Notes (Signed)
Date:  01/30/2021   HPI: Dominique [name] is a 45 y.o. female who presents to the [facility] pharmacy clinic for HIV PrEP follow-up.  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   CABOTEGRAVIR ER (APRETUDE) 600 MG/3ML INJECTION    Inject 3 mLs (600 mg total) into the muscle every 30 (thirty) days.   CABOTEGRAVIR ER (APRETUDE) 600 MG/3ML INJECTION    Inject 3 mLs (600 mg total) into the muscle every 2 (two) months.   CETIRIZINE (ZYRTEC) 10 MG TABLET    Take 10 mg by mouth daily.   DEPO-SUBQ PROVERA 104 104 MG/0.65ML INJECTION    SMARTSIG:Syringe(s) SUB-Q   DICLOFENAC SODIUM (VOLTAREN) 1 % GEL    Apply 2 g topically 4 (four) times daily.   EMTRICITABINE-TENOFOVIR (TRUVADA) 200-300 MG TABLET    Take 1 tablet by mouth daily.   GABAPENTIN (NEURONTIN) 300 MG CAPSULE    Take 300 mg by mouth 3 (three) times daily.   MELOXICAM (MOBIC) 15 MG TABLET    Take 1 tablet (15 mg total) by mouth daily.   NICOTINE POLACRILEX (NICORETTE) 2 MG GUM    Take 1 each (2 mg total) by mouth as  needed for smoking cessation.   QUETIAPINE (SEROQUEL) 100 MG TABLET      Modified Medications   No medications on file  Discontinued Medications   No medications on file    Allergies: No Known Allergies  Past Medical History: Past Medical History:  Diagnosis Date   Kidney stones    Lupus (HCC) 01/17/2021   Systemic lupus erythematosus (HCC)     Social History: Social History   Socioeconomic History   Marital status: Single    Spouse name: Not on file   Number of children: Not on file   Years of education: Not on file   Highest education level: Not on file  Occupational History   Not on file  Tobacco Use   Smoking status: Every Day    Packs/day: 0.25    Years: 15.00    Pack years: 3.75    Types: Cigarettes   Smokeless tobacco: Never  Vaping Use   Vaping Use: Never used  Substance and Sexual Activity   Alcohol use: Not Currently   Drug use: Yes    Types: Marijuana   Sexual activity: Yes    Partners: Male    Birth control/protection: Surgical, Injection  Other Topics Concern   Not on file  Social History Narrative   Not on file   Social Determinants of Health  Financial Resource Strain: Not on file  Food Insecurity: Not on file  Transportation Needs: Not on file  Physical Activity: Not on file  Stress: Not on file  Social Connections: Not on file    No flowsheet data found.  Labs:  SCr: Lab Results  Component Value Date   CREATININE 0.73 05/20/2020   CREATININE 0.76 08/01/2014   CREATININE 0.78 09/20/2013   CREATININE 0.66 03/15/2013   CREATININE 0.80 12/07/2011   HIV Lab Results  Component Value Date   HIV NON-REACTIVE 01/17/2021   HIV NON-REACTIVE 12/18/2020   HIV Non Reactive 10/16/2019   HIV Non Reactive 07/28/2019   Hepatitis B Lab Results  Component Value Date   HEPBSAB NON-REACTIVE 12/18/2020   HEPBSAG NON-REACTIVE 12/18/2020   HEPBCAB NON-REACTIVE 12/18/2020   Hepatitis C Lab Results  Component Value Date   HEPCAB  NON-REACTIVE 12/18/2020   Hepatitis A No results found for: HAV RPR and STI Lab Results  Component Value Date   LABRPR REACTIVE (A) 12/18/2020   LABRPR Non Reactive 10/16/2019   RPRTITER 1:16 (H) 12/18/2020    STI Results GC GC CT CT  10/16/2019 Negative - Negative -  05/09/2013 - NEGATIVE - NEGATIVE  12/07/2011 - - NEGATIVE -    Assessment: Dominique [name] presents to clinic today for her first Apretude injection for HIV PrEP. She will take her last does of Truvada today. She has not missed any doses or Truvada and no issues reported. Her rapid HIV test is negative, will administer Apretude today. Explained that she will follow-up in 1 month for a 2nd injection and then will follow every 2 months after. She has a follow up in a few weeks to discuss her rheumatology work-up. She had complaints of blurry vision and memory loss at her last visit. She reports improvement and will be getting glasses soon. She has had follow up appointments with rheumatology and opthalmology recently. We discussed what an elevated sedimentation rate marker meant for inflammation. She is also interested in receiving her yearly influenza vaccine today. We will administer the influenza vaccine.   Administer Cabotegravir 600 mg/82mL x1 dose into Right Upper Outer Quadrant. Discussed that she may have some injection site pain afterwards. Counseled that she can use Tylenol/NSAIDs and warm compresses if needed. No issues or concerns at this time.   Plan: -Administered Apretude -Administered the Influenza Vaccine -Follow up for 2nd Cabenuva injection on 12/5 @ 8:45 with Cassie -Scheduled Cabenuva injection for 05/02/21 @9 :15 with , PharmD PGY2 Infectious Diseases Pharmacy Resident

## 2021-01-31 LAB — URINE CYTOLOGY ANCILLARY ONLY
Chlamydia: NEGATIVE
Comment: NEGATIVE
Comment: NORMAL
Neisseria Gonorrhea: NEGATIVE

## 2021-02-02 LAB — HIV-1 RNA QUANT-NO REFLEX-BLD
HIV 1 RNA Quant: NOT DETECTED Copies/mL
HIV-1 RNA Quant, Log: NOT DETECTED Log cps/mL

## 2021-02-04 ENCOUNTER — Other Ambulatory Visit: Payer: Self-pay | Admitting: Family Medicine

## 2021-02-05 ENCOUNTER — Other Ambulatory Visit: Payer: Self-pay | Admitting: Infectious Disease

## 2021-02-05 ENCOUNTER — Other Ambulatory Visit (HOSPITAL_COMMUNITY): Payer: Self-pay

## 2021-02-14 ENCOUNTER — Other Ambulatory Visit (HOSPITAL_COMMUNITY): Payer: Self-pay

## 2021-02-16 NOTE — Progress Notes (Signed)
Office Visit Note  Patient: Dominique [name]             Date of Birth: Nov 24, 1975           MRN: [id number]             PCP: Joycelyn Man, FNP Referring: Joycelyn Man, FNP Visit Date: 02/17/2021   Subjective:  Lupus (Doing good)   History of Present Illness: Dominique [name] is a 46 y.o. female here for follow up for multiple systemic symptoms and positive ANA. Labs at initial visit showed mildly elevated ESR of 36, PC ratio 0.189 with negative SSA, SM, dsDNA, scl-70 Abs and normal complements. Since our last visit she continues having significant pain worst in her upper legs and in ankles with some overall pains. She increase gabapentin dose with her PCP to 600 mg TID and felt some improvement with this. She denies any swelling or discoloration changes.  Previous HPI 01/23/21 Dominique [name] is a 45 y.o. female here for evaluation of positive ANA and elevated sedimentation rate with multiple systemic symptoms including weight loss, lymphadenopathy, paresthesias, joint pain, and headaches.  She started noticing hyperpigmented skin rashes developing on her face and extremities since several months ago does not recall specific onset or provoking episode.  Then developed worsening joint pain in multiple sites but started around July of this year.  This involved pain affecting both hands also the hips knees and feet bilaterally.  She described morning stiffness lasting 2 or 3 hours in duration also especially pain in the bottoms of the feet first and getting out of bed.  After a month with these ongoing and somewhat worsening symptoms she was seen in primary care clinic. Workup at that time positive for trichomoniasis and syphilis and starting antibiotics also found to have high sedimentation rate with positive ANA and CCP Abs. She followed up for continued treatment last month and more recently in ID clinic with completion of antibiotic treatment for syphilis with Bicillin.  Neurology evaluation  with lumbar puncture that was not suggestive for active neurosyphilis. She saw ophthalmology for visual change symptoms reports findings were not concerning for infectious or inflammatory type changes.     Labs  10/2020 ANA pos RF neg CCP 34 ESR 56 Uric acid 3.3 CBC wnl CMP cnl RPR 1:32 / tpa pos Vit D 25.8 HIV neg TB neg   Review of Systems  Constitutional:  Positive for fatigue.  HENT:  Positive for mouth dryness.   Eyes:  Negative for dryness.  Respiratory:  Negative for shortness of breath.   Cardiovascular:  Positive for swelling in legs/feet.  Gastrointestinal:  Negative for constipation.  Endocrine: Positive for excessive thirst.  Genitourinary:  Negative for difficulty urinating.  Musculoskeletal:  Positive for joint pain, joint pain, muscle weakness, morning stiffness and muscle tenderness.  Skin:  Positive for rash.  Allergic/Immunologic: Negative for susceptible to infections.  Neurological:  Positive for numbness and weakness.  Hematological:  Negative for bruising/bleeding tendency.  Psychiatric/Behavioral:  Negative for sleep disturbance.    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   Right hip pain 05/16/2020   BRBPR (  bright red blood per rectum) 10/18/2019   Tobacco use disorder 07/31/2019   Dyspnea, paroxysmal nocturnal 07/31/2019   Schizophrenia ([place]) 07/31/2019   Encounter for screening examination for sexually transmitted disease 07/31/2019   Abnormal uterine bleeding (AUB) 05/09/2013   BV (bacterial vaginosis) 05/09/2013   Dysmenorrhea 05/09/2013    Past Medical History:  Diagnosis  Date   Kidney stones    Lupus ([place]) 01/17/2021   Systemic lupus erythematosus ([place])     Family History  Problem Relation Age of Onset   Hypertension Father    Diabetes Father    Asthma Brother    Past Surgical History:  Procedure Laterality Date   CESAREAN SECTION     HERNIA REPAIR     Social History   Social History Narrative   Not on file   Immunization History  Administered Date(s) Administered   Influenza,inj,Quad PF,6+ Mos 01/30/2021     Objective: Vital Signs: BP 102/62 (BP Location: Left Arm, Patient Position: Sitting, Cuff Size: Normal)   Pulse 97   Resp 14   Ht $R'5\' 1"'Vf$  (1.549 m)   Wt 132 lb (59.9 kg)   BMI 24.94 kg/m    Physical Exam Musculoskeletal:     Right lower leg: No edema.     Left lower leg: No edema.  Skin:    General: Skin is warm and dry.     Findings: No rash.  Neurological:     Mental Status: She is alert.  Psychiatric:        Mood and Affect: Mood normal.     Musculoskeletal Exam:  Elbows full ROM no tenderness or swelling Wrists full ROM no tenderness or swelling Fingers full ROM no tenderness or swelling Knees full ROM no tenderness or swelling Bilateral tenderness at achilles tendons without palpable swelling   Investigation: No additional findings.  Imaging: No results found.  Recent Labs: Lab Results  Component Value Date   WBC 7.8 10/16/2019   HGB 10.3 (L) 10/16/2019   PLT 226 10/16/2019   NA 138 05/20/2020   K 3.9 05/20/2020   CL 104 05/20/2020   CO2 21 05/20/2020   GLUCOSE 84 05/20/2020   BUN 12 05/20/2020   CREATININE 0.73 05/20/2020   BILITOT 0.6 08/01/2014   ALKPHOS 86 08/01/2014   AST 26 08/01/2014   ALT 23 08/01/2014   PROT 7.1 08/01/2014   ALBUMIN 3.3 (L) 08/01/2014   CALCIUM 8.9 05/20/2020   GFRAA 117 05/20/2020    Speciality Comments: No specialty comments available.  Procedures:  No procedures performed Allergies: Patient has no known allergies.   Assessment / Plan:     Visit Diagnoses:  Polyarthralgia  Positive ANA (antinuclear antibody) - Plan: hydroxychloroquine (PLAQUENIL) 200 MG tablet  Specific antibody markers are all negative does not meet classification criteria for SLE. However with positive ANA, CCP, ESR remaining high despite completion of syphilis treatment earlier this year recommend trial of hydroxychloroquine 200 mg PO daily for suspected disease activity. Preference over other DMARDs to avoid major immunosuppression. No major drug interactions with prep. Discussed risks and side effects including need for annual ophthalmology retinal exam for toxicity monitoring.  Rash and other nonspecific skin eruption  Currently doing well no new areas of facial involvement, central facial hypopigmentation remains. Starting treatment for inflammation as above.  Orders: No orders of the defined types were placed in this encounter.  Meds ordered this encounter  Medications   hydroxychloroquine (PLAQUENIL) 200 MG tablet    Sig: Take 1 tablet (200  mg total) by mouth daily.    Dispense:  30 tablet    Refill:  2      Follow-Up Instructions: Return in about 9 weeks (around 04/21/2021) for ?RA/UCTD HCQ start f/u 8-10wks.   Collier Salina, MD  Note - This record has been created using [employer].  Chart creation errors have been sought, but may not always  have been located. Such creation errors do not reflect on  the standard of medical care.

## 2021-02-17 ENCOUNTER — Encounter: Payer: Self-pay | Admitting: Internal Medicine

## 2021-02-17 ENCOUNTER — Ambulatory Visit: Payer: Medicaid Other | Admitting: Internal Medicine

## 2021-02-17 ENCOUNTER — Other Ambulatory Visit: Payer: Self-pay

## 2021-02-17 ENCOUNTER — Other Ambulatory Visit (HOSPITAL_COMMUNITY): Payer: Self-pay

## 2021-02-17 VITALS — BP 102/62 | HR 97 | Resp 14 | Ht 61.0 in | Wt 132.0 lb

## 2021-02-17 DIAGNOSIS — M2550 Pain in unspecified joint: Secondary | ICD-10-CM | POA: Diagnosis not present

## 2021-02-17 DIAGNOSIS — R768 Other specified abnormal immunological findings in serum: Secondary | ICD-10-CM

## 2021-02-17 DIAGNOSIS — R21 Rash and other nonspecific skin eruption: Secondary | ICD-10-CM

## 2021-02-17 MED ORDER — HYDROXYCHLOROQUINE SULFATE 200 MG PO TABS: 200.0000 mg | ORAL_TABLET | Freq: Every day | ORAL | 2 refills | Status: DC

## 2021-02-17 NOTE — Patient Instructions (Signed)
Hydroxychloroquine Tablets °What is this medication? °HYDROXYCHLOROQUINE (hye drox ee KLOR oh kwin) treats autoimmune conditions, such as rheumatoid arthritis and lupus. It works by slowing down an overactive immune system. It may also be used to prevent and treat malaria. It works by killing the parasite that causes malaria. It belongs to a group of medications called DMARDs. °This medicine may be used for other purposes; ask your health care provider or pharmacist if you have questions. °COMMON BRAND NAME(S): Plaquenil, Quineprox °What should I tell my care team before I take this medication? °They need to know if you have any of these conditions: °Diabetes °Eye disease, vision problems °G6PD deficiency °Heart disease °History of irregular heartbeat °If you often drink alcohol °Kidney disease °Liver disease °Porphyria °Psoriasis °An unusual or allergic reaction to chloroquine, hydroxychloroquine, other medications, foods, dyes, or preservatives °Pregnant or trying to get pregnant °Breast-feeding °How should I use this medication? °Take this medication by mouth with a glass of water. Take it as directed on the prescription label. Do not cut, crush or chew this medication. Swallow the tablets whole. Take it with food. Do not take it more than directed. Take all of this medication unless your care team tells you to stop it early. Keep taking it even if you think you are better. °Take products with antacids in them at a different time of day than this medication. Take this medication 4 hours before or 4 hours after antacids. Talk to your care team if you have questions. °Talk to your care team about the use of this medication in children. While this medication may be prescribed for selected conditions, precautions do apply. °Overdosage: If you think you have taken too much of this medicine contact a poison control center or emergency room at once. °NOTE: This medicine is only for you. Do not share this medicine with  others. °What if I miss a dose? °If you miss a dose, take it as soon as you can. If it is almost time for your next dose, take only that dose. Do not take double or extra doses. °What may interact with this medication? °Do not take this medication with any of the following: °Cisapride °Dronedarone °Pimozide °Thioridazine °This medication may also interact with the following: °Ampicillin °Antacids °Cimetidine °Cyclosporine °Digoxin °Kaolin °Medications for diabetes, like insulin, glipizide, glyburide °Medications for seizures like carbamazepine, phenobarbital, phenytoin °Mefloquine °Methotrexate °Other medications that prolong the QT interval (cause an abnormal heart rhythm) °Praziquantel °This list may not describe all possible interactions. Give your health care provider a list of all the medicines, herbs, non-prescription drugs, or dietary supplements you use. Also tell them if you smoke, drink alcohol, or use illegal drugs. Some items may interact with your medicine. °What should I watch for while using this medication? °Visit your care team for regular checks on your progress. Tell your care team if your symptoms do not start to get better or if they get worse. °You may need blood work done while you are taking this medication. If you take other medications that can affect heart rhythm, you may need more testing. Talk to your care team if you have questions. °Your vision may be tested before and during use of this medication. Tell your care team right away if you have any change in your eyesight. °This medication may cause serious skin reactions. They can happen weeks to months after starting the medication. Contact your care team right away if you notice fevers or flu-like symptoms with a rash. The   rash may be red or purple and then turn into blisters or peeling of the skin. Or, you might notice a red rash with swelling of the face, lips or lymph nodes in your neck or under your arms. °If you or your family  notice any changes in your behavior, such as new or worsening depression, thoughts of harming yourself, anxiety, or other unusual or disturbing thoughts, or memory loss, call your care team right away. °What side effects may I notice from receiving this medication? °Side effects that you should report to your care team as soon as possible: °Allergic reactions--skin rash, itching, hives, swelling of the face, lips, tongue, or throat °Aplastic anemia--unusual weakness or fatigue, dizziness, headache, trouble breathing, increased bleeding or bruising °Change in vision °Heart rhythm changes--fast or irregular heartbeat, dizziness, feeling faint or lightheaded, chest pain, trouble breathing °Infection--fever, chills, cough, or sore throat °Low blood sugar (hypoglycemia)--tremors or shaking, anxiety, sweating, cold or clammy skin, confusion, dizziness, rapid heartbeat °Muscle injury--unusual weakness or fatigue, muscle pain, dark yellow or brown urine, decrease in amount of urine °Pain, tingling, or numbness in the hands or feet °Rash, fever, and swollen lymph nodes °Redness, blistering, peeling, or loosening of the skin, including inside the mouth °Thoughts of suicide or self-harm, worsening mood, or feelings of depression °Unusual bruising or bleeding °Side effects that usually do not require medical attention (report to your care team if they continue or are bothersome): °Diarrhea °Headache °Nausea °Stomach pain °Vomiting °This list may not describe all possible side effects. Call your doctor for medical advice about side effects. You may report side effects to FDA at 1-800-FDA-1088. °Where should I keep my medication? °Keep out of the reach of children and pets. °Store at room temperature up to 30 degrees C (86 degrees F). Protect from light. Get rid of any unused medication after the expiration date. °To get rid of medications that are no longer needed or have expired: °Take the medication to a medication take-back  program. Check with your pharmacy or law enforcement to find a location. °If you cannot return the medication, check the label or package insert to see if the medication should be thrown out in the garbage or flushed down the toilet. If you are not sure, ask your care team. If it is safe to put it in the trash, empty the medication out of the container. Mix the medication with cat litter, dirt, coffee grounds, or other unwanted substance. Seal the mixture in a bag or container. Put it in the trash. °NOTE: This sheet is a summary. It may not cover all possible information. If you have questions about this medicine, talk to your doctor, pharmacist, or health care provider. °© 2022 [employer]/Gold Standard (2020-08-01 00:00:00) ° °

## 2021-02-18 ENCOUNTER — Telehealth: Payer: Self-pay

## 2021-02-18 NOTE — Telephone Encounter (Signed)
[facility] Patient Advocate Encounter  Patient's medication (Apretude) have been couriered to [facility] from [facility] and will be administered on the patient next office visit on 03/03/21.  Clearance Coots , CPhT Specialty Pharmacy Patient [facility] Phone: [phone] Fax:  [phone]

## 2021-02-19 ENCOUNTER — Ambulatory Visit: Payer: Medicaid Other | Admitting: Infectious Diseases

## 2021-02-26 ENCOUNTER — Other Ambulatory Visit: Payer: Self-pay

## 2021-02-26 ENCOUNTER — Encounter (HOSPITAL_COMMUNITY): Payer: Self-pay

## 2021-02-26 DIAGNOSIS — M79604 Pain in right leg: Secondary | ICD-10-CM | POA: Diagnosis present

## 2021-02-26 DIAGNOSIS — U071 COVID-19: Secondary | ICD-10-CM | POA: Insufficient documentation

## 2021-02-26 DIAGNOSIS — Z5321 Procedure and treatment not carried out due to patient leaving prior to being seen by health care provider: Secondary | ICD-10-CM | POA: Insufficient documentation

## 2021-02-26 NOTE — ED Triage Notes (Signed)
Pt arrives EMS from home with c/o ongoing bilateral lower extremity pain and abdominal pain. Pt thinks it is related to recent dx of lupus. Fever 102.8 at home. Took 500mg  tylenol prior to arrival.

## 2021-02-27 ENCOUNTER — Other Ambulatory Visit: Payer: Self-pay | Admitting: Pharmacist

## 2021-02-27 ENCOUNTER — Other Ambulatory Visit (HOSPITAL_COMMUNITY): Payer: Self-pay

## 2021-02-27 ENCOUNTER — Emergency Department (HOSPITAL_COMMUNITY)
Admission: EM | Admit: 2021-02-27 | Discharge: 2021-02-27 | Disposition: A | Discharge status: Home / Self Care | Payer: Medicaid Other | Attending: Emergency Medicine | Admitting: Emergency Medicine

## 2021-02-27 DIAGNOSIS — Z79899 Other long term (current) drug therapy: Secondary | ICD-10-CM

## 2021-02-27 LAB — CBC
HCT: 30.1 % — ABNORMAL LOW (ref 36.0–46.0)
Hemoglobin: 10.2 g/dL — ABNORMAL LOW (ref 12.0–15.0)
MCH: 30.7 pg (ref 26.0–34.0)
MCHC: 33.9 g/dL (ref 30.0–36.0)
MCV: 90.7 fL (ref 80.0–100.0)
Platelets: 201 10*3/uL (ref 150–400)
RBC: 3.32 MIL/uL — ABNORMAL LOW (ref 3.87–5.11)
RDW: 14.6 % (ref 11.5–15.5)
WBC: 5.3 10*3/uL (ref 4.0–10.5)
nRBC: 0 % (ref 0.0–0.2)

## 2021-02-27 LAB — URINALYSIS, ROUTINE W REFLEX MICROSCOPIC
Bacteria, UA: NONE SEEN
Bilirubin Urine: NEGATIVE
Glucose, UA: NEGATIVE mg/dL
Ketones, ur: NEGATIVE mg/dL
Nitrite: NEGATIVE
Protein, ur: NEGATIVE mg/dL
Specific Gravity, Urine: 1.01 (ref 1.005–1.030)
pH: 5 (ref 5.0–8.0)

## 2021-02-27 LAB — COMPREHENSIVE METABOLIC PANEL
ALT: 20 U/L (ref 0–44)
AST: 24 U/L (ref 15–41)
Albumin: 4.4 g/dL (ref 3.5–5.0)
Alkaline Phosphatase: 71 U/L (ref 38–126)
Anion gap: 8 (ref 5–15)
BUN: 7 mg/dL (ref 6–20)
CO2: 20 mmol/L — ABNORMAL LOW (ref 22–32)
Calcium: 8.9 mg/dL (ref 8.9–10.3)
Chloride: 106 mmol/L (ref 98–111)
Creatinine, Ser: 0.68 mg/dL (ref 0.44–1.00)
GFR, Estimated: >60 mL/min (ref >60)
Glucose, Bld: 94 mg/dL (ref 70–99)
Potassium: 3.3 mmol/L — ABNORMAL LOW (ref 3.5–5.1)
Sodium: 134 mmol/L — ABNORMAL LOW (ref 135–145)
Total Bilirubin: 0.4 mg/dL (ref 0.3–1.2)
Total Protein: 7.9 g/dL (ref 6.5–8.1)

## 2021-02-27 LAB — LIPASE, BLOOD: Lipase: 61 U/L — ABNORMAL HIGH (ref 11–51)

## 2021-02-27 LAB — RESP PANEL BY RT-PCR (FLU A&B, COVID) ARPGX2
Influenza A by PCR: NEGATIVE
Influenza B by PCR: NEGATIVE
SARS Coronavirus 2 by RT PCR: POSITIVE — AB

## 2021-02-27 LAB — I-STAT BETA HCG BLOOD, ED (MC, WL, AP ONLY): I-stat hCG, quantitative: <5 m[IU]/mL (ref <5)

## 2021-02-27 NOTE — ED Notes (Signed)
Provided pt w/labeled specimen cup for urine collection per MD order. ENMiles 

## 2021-02-27 NOTE — ED Notes (Signed)
Pts mother states se prefers not to wake her up for that. Prefers to wait until she gets a room.

## 2021-03-03 ENCOUNTER — Other Ambulatory Visit: Payer: Medicaid Other

## 2021-03-03 ENCOUNTER — Ambulatory Visit: Payer: Medicaid Other | Admitting: Pharmacist

## 2021-03-07 ENCOUNTER — Other Ambulatory Visit: Payer: Self-pay

## 2021-03-07 ENCOUNTER — Ambulatory Visit (INDEPENDENT_AMBULATORY_CARE_PROVIDER_SITE_OTHER): Payer: Medicaid Other | Admitting: Pharmacist

## 2021-03-07 ENCOUNTER — Other Ambulatory Visit: Payer: Medicaid Other

## 2021-03-07 DIAGNOSIS — Z79899 Other long term (current) drug therapy: Secondary | ICD-10-CM

## 2021-03-07 MED ORDER — CABOTEGRAVIR ER 600 MG/3ML IM SUER
600.0000 mg | Freq: Once | INTRAMUSCULAR | Status: AC
  Administered 2021-03-07: 600 mg via INTRAMUSCULAR

## 2021-03-07 NOTE — Progress Notes (Signed)
HPI: Dominique [name] is a 45 y.o. female who presents to the [facility] pharmacy clinic for Apretude administration and HIV PrEP follow up.  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   CABOTEGRAVIR ER (APRETUDE) 600 MG/3ML INJECTION    Inject 3 mLs (600 mg total) into the muscle every 2 (two) months.   CETIRIZINE (ZYRTEC) 10 MG TABLET    Take 10 mg by mouth daily.   DEPO-SUBQ PROVERA 104 104 MG/0.65ML INJECTION    SMARTSIG:Syringe(s) SUB-Q   DICLOFENAC SODIUM (VOLTAREN) 1 % GEL    Apply 2 g topically 4 (four) times daily.   EMTRICITABINE-TENOFOVIR (TRUVADA) 200-300 MG TABLET    Take 1 tablet by mouth daily.   GABAPENTIN (NEURONTIN) 300 MG CAPSULE    Take 300 mg by mouth 3 (three) times daily.   GABAPENTIN (NEURONTIN) 600 MG TABLET    Take 600 mg by mouth 3 (three) times daily.   HYDROXYCHLOROQUINE (PLAQUENIL) 200 MG TABLET    Take 1 tablet (200 mg total) by mouth daily.   MELOXICAM (MOBIC) 15 MG TABLET    Take 1 tablet (15 mg total) by mouth daily.   NICOTINE  POLACRILEX (NICORETTE) 2 MG GUM    Take 1 each (2 mg total) by mouth as needed for smoking cessation.   QUETIAPINE (SEROQUEL) 100 MG TABLET      Modified Medications   No medications on file  Discontinued Medications   No medications on file    Allergies: No Known Allergies  Past Medical History: Past Medical History:  Diagnosis Date   Kidney stones    Lupus ([facility]) 01/17/2021   Systemic lupus erythematosus ([facility])     Social History: Social History   Socioeconomic History   Marital status: Single    Spouse name: Not on file   Number of children: Not on file   Years of education: Not on file   Highest education level: Not on file  Occupational History   Not on file  Tobacco Use   Smoking status: Every Day    Packs/day: 0.25    Years: 15.00    Pack years: 3.75    Types: Cigarettes   Smokeless tobacco: Never  Vaping Use   Vaping Use: Never used  Substance and Sexual Activity   Alcohol use: Not Currently   Drug use: Yes    Types: Marijuana   Sexual activity: Yes    Partners: Male    Birth control/protection: Surgical, Injection  Other Topics Concern   Not on file  Social History Narrative   Not on file   Social  Determinants of Health   Financial Resource Strain: Not on file  Food Insecurity: Not on file  Transportation Needs: Not on file  Physical Activity: Not on file  Stress: Not on file  Social Connections: Not on file    Labs: Lab Results  Component Value Date   HIV1RNAQUANT Not Detected 01/30/2021    RPR and STI Lab Results  Component Value Date   LABRPR REACTIVE (A) 12/18/2020   LABRPR Non Reactive 10/16/2019   RPRTITER 1:16 (H) 12/18/2020    STI Results GC GC CT CT  01/30/2021 Negative - Negative -  10/16/2019 Negative - Negative -  05/09/2013 - NEGATIVE - NEGATIVE  12/07/2011 - - NEGATIVE -    Hepatitis B Lab Results  Component Value Date   HEPBSAB NON-REACTIVE 12/18/2020   HEPBSAG NON-REACTIVE 12/18/2020   HEPBCAB NON-REACTIVE  12/18/2020   Hepatitis C Lab Results  Component Value Date   HEPCAB NON-REACTIVE 12/18/2020   Hepatitis A No results found for: HAV Lipids: Lab Results  Component Value Date   CHOL 154 07/28/2019   TRIG 96 07/28/2019   HDL 57 07/28/2019   CHOLHDL 2.7 07/28/2019   LDLCALC 79 07/28/2019    TARGET DATE: The 3rd of the month  Current PrEP Regimen: Apretude  Assessment: Saveah presents today for their maintenance injection of Apretude and to follow up for HIV PrEP. She experienced mild soreness for 2 days after her first shot. She deferred any STI screening today as she has not been with any partners. Will check HIV RNA today along with rapid.  Administered cabotegravir 600mg /13mL in right upper outer quadrant of the gluteal muscle. Will make follow up appointments for maintenance injections every 2 months.   Plan: - Maintenance injections scheduled for 2/3 with me - Call with any issues or questions  1m, PharmD, CPP Clinical Pharmacist Practitioner Infectious Diseases Clinical Pharmacist [facility]

## 2021-03-09 LAB — HIV-1 RNA QUANT-NO REFLEX-BLD
HIV 1 RNA Quant: NOT DETECTED Copies/mL
HIV-1 RNA Quant, Log: NOT DETECTED Log cps/mL

## 2021-03-10 ENCOUNTER — Other Ambulatory Visit: Payer: Medicaid Other

## 2021-03-10 ENCOUNTER — Ambulatory Visit: Payer: Medicaid Other | Admitting: Pharmacist

## 2021-03-10 NOTE — Progress Notes (Signed)
NEUROLOGY CONSULTATION NOTE  SHILA KRUCZEK MRN: [id number] DOB: [birth year]  Referring provider: Payton Emerald, MD Primary care provider: Sherron Flemings, FNP  Reason for consult:  paresthesias, memory deficits  Assessment/Plan:   Generalized pain, dysesthesias, paresthesias Memory deficits Unclear etiology.  Not neurosyphilis.    1  Check serum B12, SPEP/IFE, ACE, CK, aldolase 2  NCV-EMG right upper and lower extremities 3  Follow up after testing.  Further recommendations pending results.   Subjective:  Dominique [name] is a 45 year old female with syphilis and SLE who presents for paresthesias and memory deficits.  History supplemented by referring provider's notes.    Earlier this year, she began experiencing sharp pain, numbness and swelling in the feet.  It subsequently started radiating up to her calves and knees.  Difficult to walk as it would cause painful pins and needles sensation.  She has pain in multiple joints.  She started experiencing numbness in the hands making it difficult to grasp objects.  Symptoms started getting worse in August.  She began feeling forgetful.  Blood work was positive for syphilis (RPR).  Given her symptoms, she underwent lumbar puncture to test for neuro-syphilis, which demonstrated a negative VDRL.  CT head on 12/23/2020 personally reviewed was negative. She has seen rheumatology.  No definitive diagnosis has been established.  Labs: 11/07/2020: ANA positive, weakly positive anti-CCP ab, negative RF, sed rate 56, T4 12.9, T3 Update 34%, free thyroxine index 4.4, Hgb A1c 5.3%, RPR reactive 1:32, D 25-hydroxy 25.8, Quantiferon-TB Gold Plus negative 12/18/2020:  Hepatitis B/C panel negative, HIV nefative 12/26/2020:  CSF with cell count 0, culture negative, protein 43, glucose 61, cytology negative, VDRL nonreactive 01/17/2021:  HIV negative 01/23/2021:  ds-DNA ab negative, Scl-70 ab negative, RNP ab negative, anti-Smith ab negative, C3 142, C4 28,  sed rate 36 03/07/2021:  HIV negative   PAST MEDICAL HISTORY: Past Medical History:  Diagnosis Date   Kidney stones    Lupus (HCC) 01/17/2021   Systemic lupus erythematosus (HCC)     PAST SURGICAL HISTORY: Past Surgical History:  Procedure Laterality Date   CESAREAN SECTION     HERNIA REPAIR      MEDICATIONS: Current Outpatient Medications on File Prior to Visit  Medication Sig Dispense Refill   cabotegravir ER (APRETUDE) 600 MG/3ML injection Inject 3 mLs (600 mg total) into the muscle every 30 (thirty) days. 3 mL 1   cabotegravir ER (APRETUDE) 600 MG/3ML injection Inject 3 mLs (600 mg total) into the muscle every 2 (two) months. 3 mL 5   cetirizine (ZYRTEC) 10 MG tablet Take 10 mg by mouth daily.     DEPO-SUBQ PROVERA 104 104 MG/0.65ML injection SMARTSIG:Syringe(s) SUB-Q     diclofenac Sodium (VOLTAREN) 1 % GEL Apply 2 g topically 4 (four) times daily. 150 g 1   emtricitabine-tenofovir (TRUVADA) 200-300 MG tablet Take 1 tablet by mouth daily. (Patient not taking: Reported on 02/17/2021) 30 tablet 0   gabapentin (NEURONTIN) 300 MG capsule Take 300 mg by mouth 3 (three) times daily. (Patient not taking: Reported on 02/17/2021)     gabapentin (NEURONTIN) 600 MG tablet Take 600 mg by mouth 3 (three) times daily.     hydroxychloroquine (PLAQUENIL) 200 MG tablet Take 1 tablet (200 mg total) by mouth daily. 30 tablet 2   meloxicam (MOBIC) 15 MG tablet Take 1 tablet (15 mg total) by mouth daily. 30 tablet 0   nicotine polacrilex (NICORETTE) 2 MG gum Take 1 each (2 mg total) by mouth  as needed for smoking cessation. (Patient not taking: Reported on 01/23/2021) 100 tablet 0   QUEtiapine (SEROQUEL) 100 MG tablet      No current facility-administered medications on file prior to visit.    ALLERGIES: No Known Allergies  FAMILY HISTORY: Family History  Problem Relation Age of Onset   Hypertension Father    Diabetes Father    Asthma Brother     Objective:  Blood pressure (!)  109/49, pulse 88, height 5' (1.524 m), weight 131 lb 3.2 oz (59.5 kg), SpO2 100 %. General: No acute distress.  Patient appears well-groomed.   Head:  Normocephalic/atraumatic Eyes:  fundi examined but not visualized Neck: supple, no paraspinal tenderness, full range of motion Back: No paraspinal tenderness Heart: regular rate and rhythm Lungs: Clear to auscultation bilaterally. Vascular: No carotid bruits. Neurological Exam: Mental status: alert and oriented to person, place, and time, recent and remote memory intact, fund of knowledge intact, attention and concentration intact, speech fluent and not dysarthric, language intact. Cranial nerves: CN I: not tested CN II: pupils equal, round and reactive to light, visual fields intact CN III, IV, VI:  full range of motion, no nystagmus, no ptosis CN V: facial sensation intact. CN VII: upper and lower face symmetric CN VIII: hearing intact CN IX, X: gag intact, uvula midline CN XI: sternocleidomastoid and trapezius muscles intact CN XII: tongue midline Bulk & Tone: normal, no fasciculations. Motor:  muscle strength 5/5 throughout Sensation:  Hyperesthesia to pinprick and vibration in toes and feet.  Reduce pinprick sensation in legs up to knees bilaterally. Deep Tendon Reflexes:  2+ throughout,  toes downgoing.   Finger to nose testing:  Without dysmetria.   Heel to shin:  Without dysmetria.   Gait:  Normal station and stride.  Romberg negative.    Thank you for allowing me to take part in the care of this patient.  Shon Millet, DO  CC:  Verlon Au, MD  Sherron Flemings, FNP

## 2021-03-11 ENCOUNTER — Encounter: Payer: Self-pay | Admitting: Neurology

## 2021-03-11 ENCOUNTER — Other Ambulatory Visit (INDEPENDENT_AMBULATORY_CARE_PROVIDER_SITE_OTHER): Payer: Medicaid Other

## 2021-03-11 ENCOUNTER — Other Ambulatory Visit: Payer: Self-pay

## 2021-03-11 ENCOUNTER — Ambulatory Visit: Payer: Medicaid Other | Admitting: Neurology

## 2021-03-11 VITALS — BP 109/49 | HR 88 | Ht 60.0 in | Wt 131.2 lb

## 2021-03-11 DIAGNOSIS — R413 Other amnesia: Secondary | ICD-10-CM

## 2021-03-11 DIAGNOSIS — R208 Other disturbances of skin sensation: Secondary | ICD-10-CM

## 2021-03-11 DIAGNOSIS — R202 Paresthesia of skin: Secondary | ICD-10-CM

## 2021-03-11 DIAGNOSIS — M2550 Pain in unspecified joint: Secondary | ICD-10-CM | POA: Diagnosis not present

## 2021-03-11 LAB — VITAMIN B12: Vitamin B-12: 241 pg/mL (ref 211–911)

## 2021-03-11 LAB — CK: Total CK: 118 U/L (ref 7–177)

## 2021-03-11 NOTE — Patient Instructions (Signed)
Check B12, SPEP/IFE, ACE, CK, aldolase Nerve conduction study of right arm and leg.   Follow up after testing.  Further recommendations pending results.

## 2021-03-13 LAB — PROTEIN ELECTROPHORESIS, SERUM
Albumin ELP: 4.1 g/dL (ref 3.8–4.8)
Alpha 1: 0.3 g/dL (ref 0.2–0.3)
Alpha 2: 0.8 g/dL (ref 0.5–0.9)
Beta 2: 0.3 g/dL (ref 0.2–0.5)
Beta Globulin: 0.4 g/dL (ref 0.4–0.6)
Gamma Globulin: 1.1 g/dL (ref 0.8–1.7)
Total Protein: 7.1 g/dL (ref 6.1–8.1)

## 2021-03-13 LAB — ANGIOTENSIN CONVERTING ENZYME: Angiotensin-Converting Enzyme: 28 U/L (ref 9–67)

## 2021-03-13 LAB — ALDOLASE: Aldolase: 2.2 U/L (ref <=8.1)

## 2021-03-14 LAB — IMMUNOFIXATION, SERUM
IgA/Immunoglobulin A, Serum: 209 mg/dL (ref 87–352)
IgG (Immunoglobin G), Serum: 1287 mg/dL (ref 586–1602)
IgM (Immunoglobulin M), Srm: 135 mg/dL (ref 26–217)

## 2021-04-01 ENCOUNTER — Other Ambulatory Visit (HOSPITAL_COMMUNITY): Payer: Self-pay

## 2021-04-02 ENCOUNTER — Telehealth: Payer: Self-pay

## 2021-04-02 NOTE — Telephone Encounter (Signed)
[facility] Patient Advocate Encounter  Patient's medication (Apretude) have been couriered to [facility] from [facility] and will be administered on the patient next office visit on 05/02/21.  Clearance Coots , CPhT Specialty Pharmacy Patient [facility] Phone: [phone] Fax:  [phone]

## 2021-04-09 ENCOUNTER — Other Ambulatory Visit (HOSPITAL_COMMUNITY): Payer: Self-pay

## 2021-04-09 ENCOUNTER — Other Ambulatory Visit: Payer: Self-pay | Admitting: Infectious Disease

## 2021-04-09 NOTE — Telephone Encounter (Signed)
Correct, she is receiving Apretude now. Thank you!

## 2021-04-09 NOTE — Telephone Encounter (Signed)
Patient should no longer be on Truvada, correct? Thanks

## 2021-04-10 ENCOUNTER — Other Ambulatory Visit: Payer: Self-pay

## 2021-04-10 ENCOUNTER — Ambulatory Visit: Payer: Medicaid Other | Admitting: Neurology

## 2021-04-10 DIAGNOSIS — R202 Paresthesia of skin: Secondary | ICD-10-CM | POA: Diagnosis not present

## 2021-04-10 NOTE — Procedures (Signed)
[facility]  [address], [address]  [place], [place] [address] Tel: [phone] Fax:  [phone] Test Date:  04/10/2021  Patient: Dominique [name] DOB: 03-05-1976 Physician: Nita Sickleonika Holston Oyama, DO  Sex: Female Height: 5' " Ref Phys: Shon MilletAdam Jaffe, D.O.  ID#: [id number]   Technician:    Patient Complaints: This is a 46 year old female referred for evaluation of generalized paresthesias.  NCV & EMG Findings: Electrodiagnostic testing was terminated at patient's request due to pain.  Findings show: Right median, ulnar, and mixed palmar sensory responses are within normal limits. Right median motor responses within normal limits.  Impression: This is an incomplete study, as testing was terminated due to pain.     ___________________________ Nita Sickleonika Woodson Macha, DO    Nerve Conduction Studies Anti Sensory Summary Table   Stim Site NR Peak (ms) Norm Peak (ms) P-T Amp (V) Norm P-T Amp  Right Median Anti Sensory (2nd Digit)  33C  Wrist    3.1 <3.4 60.4 >20  Right Ulnar Anti Sensory (5th Digit)  33C  Wrist    2.6 <3.1 52.3 >12   Motor Summary Table   Stim Site NR Onset (ms) Norm Onset (ms) O-P Amp (mV) Norm O-P Amp Site1 Site2 Delta-0 (ms) Dist (cm) Vel (m/s) Norm Vel (m/s)  Right Median Motor (Abd Poll Brev)  33C  Wrist    2.6 <3.9 11.0 >6 Elbow Wrist 4.4 26.0 59 >50  Elbow    7.0  10.7          Comparison Summary Table   Stim Site NR Peak (ms) Norm Peak (ms) P-T Amp (V) Site1 Site2 Delta-P (ms) Norm Delta (ms)  Right Median/Ulnar Palm Comparison (Wrist - 8cm)  33C  Median Palm    1.9 <2.2 82.1 Median Palm Ulnar Palm 0.3   Ulnar Palm    1.6 <2.2 22.0          Waveforms:

## 2021-04-15 ENCOUNTER — Other Ambulatory Visit (HOSPITAL_COMMUNITY): Payer: Self-pay

## 2021-04-16 NOTE — Progress Notes (Signed)
LMOVM for pt to call the office.  ?

## 2021-04-20 NOTE — Progress Notes (Signed)
Office Visit Note  Patient: Dominique [name]             Date of Birth: 1975-12-16           MRN: [id number]             PCP: Joycelyn Man, FNP Referring: Joycelyn Man, FNP Visit Date: 04/21/2021   Subjective:  Pain of the Left Knee, Pain of the Right Ankle, Pain of the Left Ankle, Pain of the Right Knee, Pain of the Left Hip, and Pain of the Right Hip   History of Present Illness: Dominique [name] is a 46 y.o. 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 She saw neurology with findings of hyperesthesia and nerve conduction study obtained that was incomplete although negative for findings in the portion completed.  She thinks has been some benefit to starting the hydroxychloroquine but still having significant joint pain and swelling and some progression of skin rashes with hyperpigmentation despite this.  She felt some mild GI upset with nausea or diarrhea when for starting the medicine but this quickly improved within 2 weeks.  Previous HPI 02/17/21 Dominique [name] is a 47 y.o. female here for follow up for multiple systemic symptoms and positive ANA. Labs at initial visit showed mildly elevated ESR of 36, PC ratio 0.189 with negative SSA, SM, dsDNA, scl-70 Abs and normal complements. Since our last visit she continues having significant pain worst in her upper legs and in ankles with some overall pains. She increase gabapentin dose with her PCP to 600 mg TID and felt some improvement with this. She denies any swelling or discoloration changes.   Previous HPI 01/23/21 Dominique [name] is a 46 y.o. female here for evaluation of positive ANA and elevated sedimentation rate with multiple systemic symptoms including weight loss, lymphadenopathy, paresthesias, joint pain, and headaches.  She started noticing hyperpigmented skin rashes developing on her face and extremities since several months ago does not  recall specific onset or provoking episode.  Then developed worsening joint pain in multiple sites but started around July of this year.  This involved pain affecting both hands also the hips knees and feet bilaterally.  She described morning stiffness lasting 2 or 3 hours in duration also especially pain in the bottoms of the feet first and getting out of bed.  After a month with these ongoing and somewhat worsening symptoms she was seen in primary care clinic. Workup at that time positive for trichomoniasis and syphilis and starting antibiotics also found to have high sedimentation rate with positive ANA and CCP Abs. She followed up for continued treatment last month and more recently in ID clinic with completion of antibiotic treatment for syphilis with Bicillin.  Neurology evaluation with lumbar puncture that was not suggestive for active neurosyphilis. She saw ophthalmology for visual change symptoms reports findings were not concerning for infectious or inflammatory type changes.  Constitutional:  Positive for fatigue.  Respiratory:  Negative for shortness of breath.   Cardiovascular:  Positive for swelling in legs/feet.  Gastrointestinal:  Positive for diarrhea, nausea Musculoskeletal:  Positive for joint pain, joint pain, muscle weakness, morning stiffness and muscle tenderness.  Skin:  Positive for rash.  Neurological:  Positive for numbness and weakness.  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   Right hip pain 05/16/2020   BRBPR (bright red blood per rectum) 10/18/2019   Tobacco use  disorder 07/31/2019   Dyspnea, paroxysmal nocturnal 07/31/2019   Schizophrenia ([place]) 07/31/2019   Encounter for screening examination for sexually transmitted disease 07/31/2019   Abnormal uterine bleeding (AUB) 05/09/2013   BV (bacterial vaginosis) 05/09/2013   Dysmenorrhea 05/09/2013    Past Medical History:  Diagnosis Date   Kidney stones    Lupus ([place]) 01/17/2021   Systemic lupus erythematosus ([place])     Family History  Problem Relation Age of Onset   Stroke Mother    Multiple sclerosis Father    Hypertension Father    Diabetes Father    Asthma Brother    Dementia Maternal Grandfather    Past Surgical History:  Procedure Laterality Date   CESAREAN SECTION     HERNIA REPAIR     Social History   Social History Narrative   Right handed   Immunization History  Administered Date(s) Administered   Influenza,inj,Quad PF,6+ Mos 01/30/2021     Objective: Vital Signs: BP (!) 153/70    Pulse 80    Ht 5' (1.524 m)    Wt 130 lb (59 kg)    BMI 25.39 kg/m    Physical Exam Eyes:     Conjunctiva/sclera: Conjunctivae normal.  Cardiovascular:     Rate and Rhythm: Normal rate and regular rhythm.  Pulmonary:     Effort: Pulmonary effort is normal.     Breath sounds: Normal breath sounds.  Musculoskeletal:     Right lower leg: No edema.     Left lower leg: No edema.  Skin:    General: Skin is warm and dry.     Comments: Irregular bordered discoloration with central facial clearing and peripheral hyperpigmentation  Neurological:     General: No focal deficit present.     Mental Status: She is alert.  Psychiatric:        Mood and Affect: Mood normal.     Musculoskeletal Exam:  Shoulders full ROM no tenderness or swelling Elbows full ROM no tenderness or swelling Wrists full ROM no tenderness or swelling Fingers full ROM, some focal tenderness over PIP joints without palpable synovitis in both hands Knees full ROM, tenderness over medial aspect of joint on both sides, no  palpable effusions no warmth no erythema Ankles with tenderness at Achilles tendons bilaterally without palpable swelling MTP negative for squeeze tenderness   Investigation: No additional findings.  Imaging: NCV with EMG(electromyography)  Result Date: 04/10/2021 Alda Berthold, DO     04/10/2021 12:52 PM [facility] [place], [address]  [place], [place] [address] Tel: [phone] Fax:  [phone] Test Date:  04/10/2021 Patient: Dominique [name] DOB: [birth year] Physician: Narda Amber, DO Sex: Female Height: 5' " Ref Phys: Metta Clines, D.O. ID#: [id number]   Technician:  Patient Complaints: This is a 46 year old female referred for evaluation of generalized paresthesias. NCV & EMG Findings: Electrodiagnostic testing was terminated at patient's request due to pain.  Findings show: Right median, ulnar, and mixed palmar sensory responses are within normal limits. Right median motor responses within normal limits. Impression: This is an incomplete study, as testing was terminated due to pain.  ___________________________ Narda Amber, DO Nerve Conduction Studies Anti Sensory Summary Table  Stim Site NR Peak (ms) Norm Peak (ms) P-T Amp (V) Norm P-T Amp Right Median Anti Sensory (2nd Digit)  33C Wrist    3.1 <3.4 60.4 >20 Right Ulnar Anti Sensory (5th Digit)  33C Wrist    2.6 <3.1 52.3 >12 Motor Summary Table  Stim Site NR Onset (ms) Norm Onset (ms) O-P Amp (mV) Norm O-P Amp Site1 Site2 Delta-0 (ms) Dist (cm) Vel (m/s) Norm Vel (m/s) Right Median Motor (Abd Poll Brev)  33C Wrist    2.6 <3.9 11.0 >6 Elbow Wrist 4.4 26.0 59 >50 Elbow    7.0  10.7        Comparison Summary Table  Stim Site NR Peak (ms) Norm Peak (ms) P-T Amp (V) Site1 Site2 Delta-P (ms) Norm Delta (ms) Right Median/Ulnar Palm Comparison (Wrist - 8cm)  33C Median Palm    1.9 <2.2 82.1 Median Palm Ulnar Palm 0.3  Ulnar Palm    1.6 <2.2 22.0     Waveforms:        Recent Labs: Lab Results  Component Value Date   WBC 5.3  02/27/2021   HGB 10.2 (L) 02/27/2021   PLT 201 02/27/2021   NA 134 (L) 02/27/2021   K 3.3 (L) 02/27/2021   CL 106 02/27/2021   CO2 20 (L) 02/27/2021   GLUCOSE 94 02/27/2021   BUN 7 02/27/2021   CREATININE 0.68 02/27/2021   BILITOT 0.4 02/27/2021   ALKPHOS 71 02/27/2021   AST 24 02/27/2021   ALT 20 02/27/2021   PROT 7.1 03/11/2021   ALBUMIN 4.4 02/27/2021   CALCIUM 8.9 02/27/2021   GFRAA 117 05/20/2020    Speciality Comments: No specialty comments available.  Procedures:  No procedures performed Allergies: Patient has no known allergies.   Assessment / Plan:     Visit Diagnoses: Undifferentiated connective tissue disease ([place]) - Plan: Sedimentation rate  Still with multiple ongoing symptoms skin rashes arthralgias lymphadenopathy positive autoantibodies consistent with a possible lupus or an undifferentiated connective tissue disease.  Remains difficult to say which of the symptoms could be sequelae of her disseminated syphilis infection but is they have not improved much despite completing antibiotic treatment and with her lab results we will continue trial of additional DMARD treatment.  Checking sedimentation rate see if the previous elevated value improved on the hydroxychloroquine.  Plan to continue hydroxychloroquine 200 mg daily and if baseline labs are okay plan to start methotrexate 15 mg p.o. weekly for ongoing skin and joint disease.  High risk medication use - Plan: CBC with Differential/Platelet, COMPLETE METABOLIC PANEL WITH GFR  Discussed risks of methotrexate treatment including risks for cytopenia, hepatotoxicity, mucositis or pneumonitis, and need for close monitoring.  Checking CBC and CMP today for toxicity monitoring.  She has previous negative hepatitis and tuberculosis screening reviewed.  Last chest x-ray was from a few years ago but without abnormalities and no complaints or exam abnormalities today.  Orders: Orders Placed This Encounter  Procedures    Sedimentation rate   CBC with Differential/Platelet   COMPLETE METABOLIC PANEL WITH GFR   No orders of the defined types were placed in this encounter.    Follow-Up Instructions: Return in about 4 weeks (around 05/19/2021) for SLE/Syph HCQ/MTX start f/u 4wks.   Collier Salina, MD  Note - This record has been created using [employer].  Chart creation errors have been sought, but may not always  have been located. Such  creation errors do not reflect on  the standard of medical care.

## 2021-04-21 ENCOUNTER — Ambulatory Visit (INDEPENDENT_AMBULATORY_CARE_PROVIDER_SITE_OTHER): Payer: Medicaid Other | Admitting: Internal Medicine

## 2021-04-21 ENCOUNTER — Other Ambulatory Visit: Payer: Self-pay

## 2021-04-21 ENCOUNTER — Encounter: Payer: Self-pay | Admitting: Internal Medicine

## 2021-04-21 VITALS — BP 153/70 | HR 80 | Ht 60.0 in | Wt 130.0 lb

## 2021-04-21 DIAGNOSIS — M359 Systemic involvement of connective tissue, unspecified: Secondary | ICD-10-CM | POA: Diagnosis not present

## 2021-04-21 DIAGNOSIS — Z79899 Other long term (current) drug therapy: Secondary | ICD-10-CM

## 2021-04-21 DIAGNOSIS — R768 Other specified abnormal immunological findings in serum: Secondary | ICD-10-CM | POA: Diagnosis not present

## 2021-04-21 LAB — CBC WITH DIFFERENTIAL/PLATELET
Absolute Monocytes: 395 cells/uL (ref 200–950)
Basophils Absolute: 13 cells/uL (ref 0–200)
Basophils Relative: 0.2 %
Eosinophils Absolute: 13 cells/uL — ABNORMAL LOW (ref 15–500)
Eosinophils Relative: 0.2 %
HCT: 32.3 % — ABNORMAL LOW (ref 35.0–45.0)
Hemoglobin: 10.9 g/dL — ABNORMAL LOW (ref 11.7–15.5)
Lymphs Abs: 3156 cells/uL (ref 850–3900)
MCH: 30.8 pg (ref 27.0–33.0)
MCHC: 33.7 g/dL (ref 32.0–36.0)
MCV: 91.2 fL (ref 80.0–100.0)
MPV: 10.8 fL (ref 7.5–12.5)
Monocytes Relative: 5.9 %
Neutro Abs: 3122 cells/uL (ref 1500–7800)
Neutrophils Relative %: 46.6 %
Platelets: 231 10*3/uL (ref 140–400)
RBC: 3.54 10*6/uL — ABNORMAL LOW (ref 3.80–5.10)
RDW: 13.9 % (ref 11.0–15.0)
Total Lymphocyte: 47.1 %
WBC: 6.7 10*3/uL (ref 3.8–10.8)

## 2021-04-21 LAB — COMPLETE METABOLIC PANEL WITH GFR
AG Ratio: 1.4 (calc) (ref 1.0–2.5)
ALT: 16 U/L (ref 6–29)
AST: 21 U/L (ref 10–30)
Albumin: 4.3 g/dL (ref 3.6–5.1)
Alkaline phosphatase (APISO): 68 U/L (ref 31–125)
BUN/Creatinine Ratio: 7 (calc) (ref 6–22)
BUN: 5 mg/dL — ABNORMAL LOW (ref 7–25)
CO2: 24 mmol/L (ref 20–32)
Calcium: 9.3 mg/dL (ref 8.6–10.2)
Chloride: 107 mmol/L (ref 98–110)
Creat: 0.76 mg/dL (ref 0.50–0.99)
Globulin: 3 g/dL (calc) (ref 1.9–3.7)
Glucose, Bld: 82 mg/dL (ref 65–99)
Potassium: 3.6 mmol/L (ref 3.5–5.3)
Sodium: 138 mmol/L (ref 135–146)
Total Bilirubin: 0.6 mg/dL (ref 0.2–1.2)
Total Protein: 7.3 g/dL (ref 6.1–8.1)
eGFR: 99 mL/min/{1.73_m2} (ref >=60)

## 2021-04-21 LAB — SEDIMENTATION RATE: Sed Rate: 14 mm/h (ref 0–20)

## 2021-04-21 NOTE — Patient Instructions (Signed)
Methotrexate Tablets What is this medication? METHOTREXATE (METH oh TREX ate) treats inflammatory conditions such as arthritis and psoriasis. It works by decreasing inflammation, which can reduce pain and prevent long-term injury to the joints and skin. It may also be used to treat some types of cancer. It works by slowing down the growth of cancer cells. This medicine may be used for other purposes; ask your health care provider or pharmacist if you have questions. COMMON BRAND NAME(S): Rheumatrex, Trexall What should I tell my care team before I take this medication? They need to know if you have any of these conditions: Fluid in the stomach area or lungs If you often drink alcohol Infection or immune system problems Kidney disease or on hemodialysis Liver disease Low blood counts, like low white cell, platelet, or red cell counts Lung disease Radiation therapy Stomach ulcers Ulcerative colitis An unusual or allergic reaction to methotrexate, other medications, foods, dyes, or preservatives Pregnant or trying to get pregnant Breast-feeding How should I use this medication? Take this medication by mouth with a glass of water. Follow the directions on the prescription label. Take your medication at regular intervals. Do not take it more often than directed. Do not stop taking except on your care team's advice. Make sure you know why you are taking this medication and how often you should take it. If this medication is used for a condition that is not cancer, like arthritis or psoriasis, it should be taken weekly, NOT daily. Taking this medication more often than directed can cause serious side effects, even death. Talk to your care team about safe handling and disposal of this medication. You may need to take special precautions. Talk to your care team about the use of this medication in children. While this medication may be prescribed for selected conditions, precautions do  apply. Overdosage: If you think you have taken too much of this medicine contact a poison control center or emergency room at once. NOTE: This medicine is only for you. Do not share this medicine with others. What if I miss a dose? If you miss a dose, talk with your care team. Do not take double or extra doses. What may interact with this medication? Do not take this medication with any of the following: Acitretin This medication may also interact with the following: Aspirin and aspirin-like medications including salicylates Azathioprine Certain antibiotics like penicillins, tetracycline, and chloramphenicol Certain medications that treat or prevent blood clots like warfarin, apixaban, dabigatran, and rivaroxaban Certain medications for stomach problems like esomeprazole, omeprazole, pantoprazole Cyclosporine Dapsone Diuretics Gold Hydroxychloroquine Live virus vaccines Medications for infection like acyclovir, adefovir, amphotericin B, bacitracin, cidofovir, foscarnet, ganciclovir, gentamicin, pentamidine, vancomycin Mercaptopurine NSAIDs, medications for pain and inflammation, like ibuprofen or naproxen Other cytotoxic agents Pamidronate Pemetrexed Penicillamine Phenylbutazone Phenytoin Probenecid Pyrimethamine Retinoids such as isotretinoin and tretinoin Steroid medications like prednisone or cortisone Sulfonamides like sulfasalazine and trimethoprim/sulfamethoxazole Theophylline Zoledronic acid This list may not describe all possible interactions. Give your health care provider a list of all the medicines, herbs, non-prescription drugs, or dietary supplements you use. Also tell them if you smoke, drink alcohol, or use illegal drugs. Some items may interact with your medicine. What should I watch for while using this medication? Avoid alcoholic drinks. This medication can make you more sensitive to the sun. Keep out of the sun. If you cannot avoid being in the sun, wear  protective clothing and use sunscreen. Do not use sun lamps or tanning beds/booths. You may need   blood work done while you are taking this medication. Call your care team for advice if you get a fever, chills or sore throat, or other symptoms of a cold or flu. Do not treat yourself. This medication decreases your body's ability to fight infections. Try to avoid being around people who are sick. This medication may increase your risk to bruise or bleed. Call your care team if you notice any unusual bleeding. Be careful brushing or flossing your teeth or using a toothpick because you may get an infection or bleed more easily. If you have any dental work done, tell your dentist you are receiving this medication. Check with your care team if you get an attack of severe diarrhea, nausea and vomiting, or if you sweat a lot. The loss of too much body fluid can make it dangerous for you to take this medication. Talk to your care team about your risk of cancer. You may be more at risk for certain types of cancers if you take this medication. Do not become pregnant while taking this medication or for 6 months after stopping it. Women should inform their care team if they wish to become pregnant or think they might be pregnant. Men should not father a child while taking this medication and for 3 months after stopping it. There is potential for serious harm to an unborn child. Talk to your care team for more information. Do not breast-feed an infant while taking this medication or for 1 week after stopping it. This medication may make it more difficult to get pregnant or father a child. Talk to your care team if you are concerned about your fertility. What side effects may I notice from receiving this medication? Side effects that you should report to your care team as soon as possible: Allergic reactions--skin rash, itching, hives, swelling of the face, lips, tongue, or throat Blood clot--pain, swelling, or warmth  in the leg, shortness of breath, chest pain Dry cough, shortness of breath or trouble breathing Infection--fever, chills, cough, sore throat, wounds that don't heal, pain or trouble when passing urine, general feeling of discomfort or being unwell Kidney injury--decrease in the amount of urine, swelling of the ankles, hands, or feet Liver injury--right upper belly pain, loss of appetite, nausea, light-colored stool, dark yellow or brown urine, yellowing of the skin or eyes, unusual weakness or fatigue Low red blood cell count--unusual weakness or fatigue, dizziness, headache, trouble breathing Redness, blistering, peeling, or loosening of the skin, including inside the mouth Seizures Unusual bruising or bleeding Side effects that usually do not require medical attention (report to your care team if they continue or are bothersome): Diarrhea Dizziness Hair loss Nausea Pain, redness, or swelling with sores inside the mouth or throat Vomiting This list may not describe all possible side effects. Call your doctor for medical advice about side effects. You may report side effects to FDA at 1-800-FDA-1088. Where should I keep my medication? Keep out of the reach of children and pets. Store at room temperature between 20 and 25 degrees C (68 and 77 degrees F). Protect from light. Get rid of any unused medication after the expiration date. Talk to your care team about how to dispose of unused medication. Special directions may apply. NOTE: This sheet is a summary. It may not cover all possible information. If you have questions about this medicine, talk to your doctor, pharmacist, or health care provider.  2022 [employer]vier/Gold Standard (2020-05-20 00:00:00)  

## 2021-04-22 ENCOUNTER — Other Ambulatory Visit: Payer: Self-pay | Admitting: Internal Medicine

## 2021-04-22 DIAGNOSIS — M359 Systemic involvement of connective tissue, unspecified: Secondary | ICD-10-CM

## 2021-04-22 DIAGNOSIS — R768 Other specified abnormal immunological findings in serum: Secondary | ICD-10-CM | POA: Insufficient documentation

## 2021-04-22 MED ORDER — METHOTREXATE 2.5 MG PO TABS: 15.0000 mg | ORAL_TABLET | ORAL | 0 refills | Status: AC

## 2021-04-22 MED ORDER — FOLIC ACID 1 MG PO TABS: 1.0000 mg | ORAL_TABLET | Freq: Every day | ORAL | 0 refills | Status: DC

## 2021-04-22 NOTE — Addendum Note (Signed)
Addended by: Fuller Plan on: 04/22/2021 08:23 AM   Modules accepted: Orders

## 2021-04-22 NOTE — Progress Notes (Signed)
Lab result look fine, her anemia is slightly improving Hgb 10.9 from 10.2. Her inflammation markers are decreased sedimentation rate is down to 14 from 36 and is now normal. No problems with kidney or liver function that would increase any risk for treatment. She can start taking the methotrexate 15 mg PO weekly and folic acid 1 mg daily like we discussed and we will follow up to monitor this.

## 2021-04-23 ENCOUNTER — Telehealth: Payer: Self-pay

## 2021-04-23 DIAGNOSIS — R413 Other amnesia: Secondary | ICD-10-CM

## 2021-04-23 NOTE — Telephone Encounter (Signed)
-----   Message from Pieter Partridge, DO sent at 04/22/2021 11:55 AM EST ----- Just going to see what the MRI shows for now ----- Message ----- From: Venetia Night, CMA Sent: 04/22/2021  11:27 AM EST To: Pieter Partridge, DO  Hey do you want the patient to come back and do the EMG and MRI or just the MRI ----- Message ----- From: Pieter Partridge, DO Sent: 04/11/2021   3:45 PM EST To: [facility]  The nerve conduction study was incomplete as she requested to stop it prematurely due to pain.  Ideally, I would like to have this exam completed.  However, based on her neurologic exam and findings on the nerve conduction study that were performed (which were normal), I do not suspect that this is a primary neurologic etiology, likely a pain syndrome.  At this point, I do not have anything else to offer, so her scheduled follow up can be cancelled.

## 2021-04-23 NOTE — Telephone Encounter (Signed)
Tried calling pt, no answer.LMOVM for pt to call back.   MRI Brain W/Wo Contrast ordered per Dr.Jaffe.

## 2021-04-24 NOTE — Telephone Encounter (Signed)
Pt advised of Dr.Jaffe's recommendation.

## 2021-04-24 NOTE — Telephone Encounter (Signed)
Patient is calling back for sheena

## 2021-04-28 ENCOUNTER — Other Ambulatory Visit: Payer: Self-pay | Admitting: Pharmacist

## 2021-04-28 DIAGNOSIS — Z79899 Other long term (current) drug therapy: Secondary | ICD-10-CM

## 2021-05-01 ENCOUNTER — Other Ambulatory Visit: Payer: Self-pay | Admitting: Internal Medicine

## 2021-05-01 DIAGNOSIS — M2550 Pain in unspecified joint: Secondary | ICD-10-CM

## 2021-05-01 DIAGNOSIS — R768 Other specified abnormal immunological findings in serum: Secondary | ICD-10-CM

## 2021-05-01 DIAGNOSIS — R21 Rash and other nonspecific skin eruption: Secondary | ICD-10-CM

## 2021-05-01 NOTE — Telephone Encounter (Signed)
Next Visit: 05/20/2021  Last Visit: 04/21/2021  Labs: 04/21/2021 Lab result look fine, her anemia is slightly improving Hgb 10.9 from 10.2. No problems with kidney or liver function that would increase any risk for treatment  Eye exam: not on file   Current Dose per office note 04/21/2021: hydroxychloroquine 200 mg daily  [id number] connective tissue disease   Last Fill: 02/17/2021  Okay to refill Plaquenil?

## 2021-05-02 ENCOUNTER — Ambulatory Visit (INDEPENDENT_AMBULATORY_CARE_PROVIDER_SITE_OTHER): Payer: Medicaid Other | Admitting: Pharmacist

## 2021-05-02 ENCOUNTER — Other Ambulatory Visit: Payer: Self-pay

## 2021-05-02 ENCOUNTER — Other Ambulatory Visit: Payer: Medicaid Other

## 2021-05-02 DIAGNOSIS — Z79899 Other long term (current) drug therapy: Secondary | ICD-10-CM | POA: Diagnosis not present

## 2021-05-02 MED ORDER — CABOTEGRAVIR ER 600 MG/3ML IM SUER
600.0000 mg | Freq: Once | INTRAMUSCULAR | Status: AC
  Administered 2021-05-02: 600 mg via INTRAMUSCULAR

## 2021-05-02 NOTE — Progress Notes (Signed)
HPI: Dominique [name] is a 46 y.o. female who presents to the [facility] pharmacy clinic for Apretude administration and HIV PrEP follow up.  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   CABOTEGRAVIR ER (APRETUDE) 600 MG/3ML INJECTION    Inject 3 mLs (600 mg total) into the muscle every 2 (two) months.   CETIRIZINE (ZYRTEC) 10 MG TABLET    Take 10 mg by mouth daily.   DEPO-SUBQ PROVERA 104 104 MG/0.65ML INJECTION    SMARTSIG:Syringe(s) SUB-Q   DICLOFENAC SODIUM (VOLTAREN) 1 % GEL    Apply 2 g topically 4 (four) times daily.   EMTRICITABINE-TENOFOVIR (TRUVADA) 200-300 MG TABLET    Take 1 tablet by mouth daily.   FOLIC ACID (FOLVITE) 1 MG TABLET    Take 1 tablet (1 mg total) by mouth daily.   GABAPENTIN (NEURONTIN) 600 MG TABLET    Take 600 mg by mouth 3 (three) times daily.   HYDROXYCHLOROQUINE (PLAQUENIL) 200 MG TABLET    TAKE 1 TABLET(200 MG) BY MOUTH DAILY   MELOXICAM (MOBIC) 15 MG  TABLET    Take 1 tablet (15 mg total) by mouth daily.   METHOTREXATE (RHEUMATREX) 2.5 MG TABLET    Take 6 tablets (15 mg total) by mouth once a week. Caution:Chemotherapy. Protect from light.   QUETIAPINE (SEROQUEL) 100 MG TABLET      Modified Medications   No medications on file  Discontinued Medications   No medications on file    Allergies: No Known Allergies  Past Medical History: Past Medical History:  Diagnosis Date   Kidney stones    Lupus (HCC) 01/17/2021   Systemic lupus erythematosus (HCC)     Social History: Social History   Socioeconomic History   Marital status: Single    Spouse name: Not on file   Number of children: Not on file   Years of education: Not on file   Highest education level: Not on file  Occupational History   Not on file  Tobacco Use   Smoking status: Every Day    Packs/day: 0.25    Years: 15.00    Pack years: 3.75    Types: Cigarettes   Smokeless tobacco: Never  Vaping Use   Vaping Use: Never used  Substance and Sexual Activity   Alcohol use: Not Currently   Drug use: Yes    Types: Marijuana   Sexual activity: Yes    Partners: Male    Birth control/protection: Surgical, Injection  Other Topics Concern   Not on  file  Social History Narrative   Right handed   Social Determinants of Health   Financial Resource Strain: Not on file  Food Insecurity: Not on file  Transportation Needs: Not on file  Physical Activity: Not on file  Stress: Not on file  Social Connections: Not on file    Labs: Lab Results  Component Value Date   HIV1RNAQUANT Not Detected 03/07/2021   HIV1RNAQUANT Not Detected 01/30/2021    RPR and STI Lab Results  Component Value Date   LABRPR REACTIVE (A) 12/18/2020   LABRPR Non Reactive 10/16/2019   RPRTITER 1:16 (H) 12/18/2020    STI Results GC GC CT CT  01/30/2021 Negative - Negative -  10/16/2019 Negative - Negative -  05/09/2013 - NEGATIVE - NEGATIVE  12/07/2011 - - NEGATIVE -    Hepatitis B Lab  Results  Component Value Date   HEPBSAB NON-REACTIVE 12/18/2020   HEPBSAG NON-REACTIVE 12/18/2020   HEPBCAB NON-REACTIVE 12/18/2020   Hepatitis C Lab Results  Component Value Date   HEPCAB NON-REACTIVE 12/18/2020   Hepatitis A No results found for: HAV Lipids: Lab Results  Component Value Date   CHOL 154 07/28/2019   TRIG 96 07/28/2019   HDL 57 07/28/2019   CHOLHDL 2.7 07/28/2019   LDLCALC 79 07/28/2019    TARGET DATE: The 3rd of the month  Current PrEP Regimen: Apretude  Assessment: Dominique [name] presents today for their maintenance injection of Apretude and to follow up for HIV PrEP. Patient requested STI screening as she was with a new partner ~2 months ago. Will check RPR at next visit due to needing additional lab collection today.   Administered cabotegravir 600mg /543mL in right upper outer quadrant of the gluteal muscle. Will make follow up appointments for maintenance injections every 2 months.   Plan: - Maintenance injections scheduled for 3/29, 6/1, and 8/3 with me - Call with any issues or questions  Margarite GougeAmanda Canary Fister, PharmD, CPP Clinical Pharmacist Practitioner Infectious Diseases Clinical Pharmacist [facility]

## 2021-05-05 LAB — C. TRACHOMATIS/N. GONORRHOEAE RNA
C. trachomatis RNA, TMA: NOT DETECTED
N. gonorrhoeae RNA, TMA: NOT DETECTED

## 2021-05-05 LAB — HIV-1 RNA QUANT-NO REFLEX-BLD
HIV 1 RNA Quant: NOT DETECTED Copies/mL
HIV-1 RNA Quant, Log: NOT DETECTED Log cps/mL

## 2021-05-17 ENCOUNTER — Other Ambulatory Visit: Payer: Medicaid Other

## 2021-05-19 NOTE — Progress Notes (Signed)
Office Visit Note  Patient: Dominique [name]             Date of Birth: [birth year]           MRN: [id number]             PCP: Dot Been, FNP Referring: Dot Been, FNP Visit Date: 05/20/2021   Subjective:  Follow-up (Bil knee and bil foot pain)   History of Present Illness: [name] is a 46 y.o. 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 So far her symptoms are about the same. She continues having pain worst in her feet and less extent along medial side of knees. She does not see much swelling or discoloration just pain. Has not noticed any side effects of the methotrexate.  Previous HPI 04/21/21 [name] is a 46 y.o. 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 She saw neurology with findings of hyperesthesia and nerve conduction study obtained that was incomplete although negative for findings in the portion completed.  She thinks has been some benefit to starting the hydroxychloroquine but still having significant joint pain and swelling and some progression of skin rashes with hyperpigmentation despite this.  She felt some mild GI upset with nausea or diarrhea when for starting the medicine but this quickly improved within 2 weeks.  Previous HPI 01/23/21 [name] is a 46 y.o. female here for evaluation of positive ANA and elevated sedimentation rate with multiple systemic symptoms including weight loss, lymphadenopathy, paresthesias, joint pain, and headaches.  She started noticing hyperpigmented skin rashes developing on her face and extremities since several months ago does not recall specific onset or provoking episode.  Then developed worsening joint pain in multiple sites but started around July of this year.  This involved pain affecting both hands also the hips knees and  feet bilaterally.  She described morning stiffness lasting 2 or 3 hours in duration also especially pain in the bottoms of the feet first and getting out of bed.  After a month with these ongoing and somewhat worsening symptoms she was seen in primary care clinic. Workup at that time positive for trichomoniasis and syphilis and starting antibiotics also found to have high sedimentation rate with positive ANA and CCP Abs. She followed up for continued treatment last month and more recently in ID clinic with completion of antibiotic treatment for syphilis with Bicillin.  Neurology evaluation with lumbar puncture that was not suggestive for active neurosyphilis. She saw ophthalmology for visual change symptoms reports findings were not concerning for infectious or inflammatory type changes.     Review of Systems  Constitutional:  Positive for fatigue.  HENT:  Negative for mouth dryness.   Eyes:  Negative for dryness.  Respiratory:  Negative for shortness of breath.   Cardiovascular:  Positive for swelling in legs/feet.  Gastrointestinal:  Negative for constipation.  Endocrine: Negative for excessive thirst.  Genitourinary:  Negative for difficulty urinating.  Musculoskeletal:  Positive for joint pain, joint pain, joint swelling, muscle weakness, morning stiffness and muscle tenderness.  Skin:  Positive for color change and rash.  Allergic/Immunologic: Negative for susceptible to infections.  Neurological:  Positive for numbness and weakness.  Hematological:  Negative for bruising/bleeding tendency.  Psychiatric/Behavioral:  Positive for sleep disturbance.    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   Right hip pain 05/16/2020   BRBPR (bright red blood per rectum) 10/18/2019   Tobacco use disorder 07/31/2019   Dyspnea, paroxysmal nocturnal 07/31/2019   Schizophrenia (HCC) 07/31/2019   Encounter for screening examination for sexually transmitted disease 07/31/2019   Abnormal uterine bleeding (AUB) 05/09/2013   BV (bacterial vaginosis) 05/09/2013   Dysmenorrhea 05/09/2013    Past Medical History:  Diagnosis Date   Kidney stones    Lupus (HCC) 01/17/2021   Systemic lupus erythematosus (HCC)     Family History  Problem Relation Age of Onset   Stroke Mother    Multiple sclerosis Father    Hypertension Father    Diabetes Father    Asthma Brother    Dementia Maternal Grandfather    Past Surgical History:  Procedure Laterality Date   CESAREAN SECTION     HERNIA REPAIR     Social History   Social History Narrative   Right handed   Immunization History  Administered Date(s) Administered   Influenza,inj,Quad PF,6+ Mos 01/30/2021     Objective: Vital Signs: BP 126/77 (BP Location: Left Arm, Patient Position: Sitting, Cuff Size: Normal)    Pulse 97    Resp 14    Ht 5' (1.524 m)    Wt 125 lb (56.7 kg)    BMI 24.41 kg/m    Physical Exam Cardiovascular:     Rate and Rhythm: Normal rate and regular rhythm.  Pulmonary:     Effort: Pulmonary effort is normal.     Breath sounds: Normal breath sounds.  Musculoskeletal:     Right lower leg: No edema.     Left lower leg: No edema.  Skin:    General: Skin is warm and dry.     Findings: No rash.  Psychiatric:        Mood and Affect: Mood normal.     Musculoskeletal Exam:  Elbows full ROM no tenderness or swelling Wrists full ROM no tenderness or swelling Fingers full ROM no palpable tenderness or synovitis Knees full ROM, tenderness over medial aspect of joint on both sides, no palpable effusions, no warmth, no erythema Ankles  with tenderness at Achilles tendons bilaterally without palpable swelling or nodule, normal ROM of both ankles  Investigation: No additional findings.  Imaging: MR BRAIN W WO CONTRAST  Result Date: 05/21/2021 CLINICAL DATA:  Memory deficits. Additional history provided by scanning technologist: Memory loss, leg and foot pain/numbness EXAM: MRI HEAD WITHOUT AND WITH CONTRAST TECHNIQUE: Multiplanar, multiecho pulse sequences of the brain and surrounding structures were obtained without and with intravenous contrast. CONTRAST:  20mL MULTIHANCE GADOBENATE DIMEGLUMINE 529 MG/ML IV SOLN COMPARISON:  Prior head CT examinations 12/23/2020 and earlier FINDINGS: Brain: Intermittently motion degraded examination. Most notably, there is severe motion degradation of the sagittal T2 FLAIR sequence. Cerebral volume appears normal for age. There are a few scattered punctate foci of T2 FLAIR hyperintense signal abnormality within bilateral cerebral white matter, nonspecific. 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 Adjacent to this, there is a small ill-defined focus of enhancement with corresponding susceptibility-weighted  signal loss. 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 No cortical encephalomalacia is identified. There is no acute infarct. No evidence of an intracranial mass. No chronic intracranial blood products. No extra-axial fluid collection. No midline shift. No pathologic intracranial enhancement identified. Vascular: Maintained flow voids within the proximal large arterial vessels. 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 Adjacent to this, there is a small ill-defined focus of enhancement with corresponding susceptibility weighted signal loss. This constellation of findings likely reflects a mixed slow-flow  vascular malformation (developmental venous anomaly and capillary telangiectasia). Bulbous appearing vascular enhancement (measuring 4 mm) in the region of the anterior communicating artery, suspicious for an anterior communicating artery aneurysm (for instance as seen on series 13, image 63). Skull and upper cervical spine: No focal suspicious marrow lesion Sinuses/Orbits: Visualized orbits show no acute finding. Trace mucosal thickening within the bilateral ethmoid, sphenoid and right maxillary sinuses. Mild mucosal thickening within the left maxillary sinus. Impression #4 will be called to the ordering clinician or representative by the Radiologist Assistant, and communication documented in the PACS or [employer]. IMPRESSION: 1. Intermittently motion degraded exam. 2. No evidence of acute intracranial abnormality. 3. There are a few nonspecific punctate T2 FLAIR hyperintense remote insults scattered within the bilateral cerebral white matter. 4. 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 MR or CT angiography of the head is recommended for further evaluation. 5. 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 6. Mild paranasal sinus disease. Electronically Signed   By: Jackey Loge D.O.   On: 05/21/2021 17:47    Recent Labs: Lab Results  Component Value Date   WBC 7.9 05/20/2021   HGB 10.3 (L) 05/20/2021   PLT 221 05/20/2021   NA 140 05/20/2021   K 3.9 05/20/2021   CL 107 05/20/2021   CO2 27 05/20/2021   GLUCOSE 87 05/20/2021   BUN 9 05/20/2021   CREATININE 0.80 05/20/2021   BILITOT 0.6 05/20/2021   ALKPHOS 71 02/27/2021   AST 21 05/20/2021   ALT 12 05/20/2021   PROT 7.3 05/20/2021   ALBUMIN 4.4 02/27/2021   CALCIUM 9.1 05/20/2021   GFRAA 117 05/20/2020    Speciality  Comments: No specialty comments available.  Procedures:  No procedures performed Allergies: Patient has no known allergies.   Assessment / Plan:     Visit Diagnoses: Positive ANA (antinuclear antibody) Positive anti-CCP test Undifferentiated connective tissue disease (HCC)  Ongoing inflammatory arthritis symptoms exam with minimal synovitis now although pain is persistent hands, knees, feet bilaterally. Tolerating methotrexate although no benefit so far. Plan to continue HCQ 200 mg daily and MTX 15 mg PO weekly too early to decide benefit.  High risk medication use - Plan: CBC with Differential/Platelet, COMPLETE METABOLIC PANEL WITH GFR  Checking CBC and CMP now after new methotrexate start.  Polyarthralgia  Chronic pain of both ankles   Orders: Orders Placed This Encounter  Procedures   CBC with Differential/Platelet   COMPLETE METABOLIC PANEL WITH GFR   No orders of the defined types were placed in this encounter.    Follow-Up Instructions: Return in about 2 months (around 07/18/2021) for RA/UCTD HCQ/MTX f/u 27mos.   Fuller Plan, MD  Note - This record has been created using [employer].  Chart creation errors have been  Chart creation errors have been sought, but may not always  have been located. Such creation errors do not reflect on  the standard of medical care.

## 2021-05-20 ENCOUNTER — Encounter: Payer: Self-pay | Admitting: Internal Medicine

## 2021-05-20 ENCOUNTER — Other Ambulatory Visit: Payer: Self-pay

## 2021-05-20 ENCOUNTER — Ambulatory Visit (INDEPENDENT_AMBULATORY_CARE_PROVIDER_SITE_OTHER): Payer: Medicaid Other | Admitting: Internal Medicine

## 2021-05-20 VITALS — BP 126/77 | HR 97 | Resp 14 | Ht 60.0 in | Wt 125.0 lb

## 2021-05-20 DIAGNOSIS — R768 Other specified abnormal immunological findings in serum: Secondary | ICD-10-CM

## 2021-05-20 DIAGNOSIS — M25571 Pain in right ankle and joints of right foot: Secondary | ICD-10-CM

## 2021-05-20 DIAGNOSIS — M359 Systemic involvement of connective tissue, unspecified: Secondary | ICD-10-CM | POA: Diagnosis not present

## 2021-05-20 DIAGNOSIS — M25572 Pain in left ankle and joints of left foot: Secondary | ICD-10-CM

## 2021-05-20 DIAGNOSIS — Z79899 Other long term (current) drug therapy: Secondary | ICD-10-CM

## 2021-05-20 DIAGNOSIS — M2550 Pain in unspecified joint: Secondary | ICD-10-CM | POA: Diagnosis not present

## 2021-05-20 DIAGNOSIS — G8929 Other chronic pain: Secondary | ICD-10-CM

## 2021-05-20 LAB — COMPLETE METABOLIC PANEL WITH GFR
AG Ratio: 1.5 (calc) (ref 1.0–2.5)
ALT: 12 U/L (ref 6–29)
AST: 21 U/L (ref 10–30)
Albumin: 4.4 g/dL (ref 3.6–5.1)
Alkaline phosphatase (APISO): 69 U/L (ref 31–125)
BUN: 9 mg/dL (ref 7–25)
CO2: 27 mmol/L (ref 20–32)
Calcium: 9.1 mg/dL (ref 8.6–10.2)
Chloride: 107 mmol/L (ref 98–110)
Creat: 0.8 mg/dL (ref 0.50–0.99)
Globulin: 2.9 g/dL (calc) (ref 1.9–3.7)
Glucose, Bld: 87 mg/dL (ref 65–99)
Potassium: 3.9 mmol/L (ref 3.5–5.3)
Sodium: 140 mmol/L (ref 135–146)
Total Bilirubin: 0.6 mg/dL (ref 0.2–1.2)
Total Protein: 7.3 g/dL (ref 6.1–8.1)
eGFR: 93 mL/min/{1.73_m2} (ref >=60)

## 2021-05-20 LAB — CBC WITH DIFFERENTIAL/PLATELET
Absolute Monocytes: 490 cells/uL (ref 200–950)
Basophils Absolute: 24 cells/uL (ref 0–200)
Basophils Relative: 0.3 %
Eosinophils Absolute: 24 cells/uL (ref 15–500)
Eosinophils Relative: 0.3 %
HCT: 30.5 % — ABNORMAL LOW (ref 35.0–45.0)
Hemoglobin: 10.3 g/dL — ABNORMAL LOW (ref 11.7–15.5)
Lymphs Abs: 3097 cells/uL (ref 850–3900)
MCH: 31.1 pg (ref 27.0–33.0)
MCHC: 33.8 g/dL (ref 32.0–36.0)
MCV: 92.1 fL (ref 80.0–100.0)
MPV: 10.3 fL (ref 7.5–12.5)
Monocytes Relative: 6.2 %
Neutro Abs: 4266 cells/uL (ref 1500–7800)
Neutrophils Relative %: 54 %
Platelets: 221 10*3/uL (ref 140–400)
RBC: 3.31 10*6/uL — ABNORMAL LOW (ref 3.80–5.10)
RDW: 14.2 % (ref 11.0–15.0)
Total Lymphocyte: 39.2 %
WBC: 7.9 10*3/uL (ref 3.8–10.8)

## 2021-05-21 ENCOUNTER — Ambulatory Visit
Admission: RE | Admit: 2021-05-21 | Discharge: 2021-05-21 | Disposition: A | Discharge status: Home / Self Care | Payer: Medicaid Other | Source: Ambulatory Visit | Attending: Neurology | Admitting: Neurology

## 2021-05-21 DIAGNOSIS — R413 Other amnesia: Secondary | ICD-10-CM

## 2021-05-21 MED ORDER — GADOBENATE DIMEGLUMINE 529 MG/ML IV SOLN
20.0000 mL | Freq: Once | INTRAVENOUS | Status: AC | PRN
  Administered 2021-05-21: 20 mL via INTRAVENOUS

## 2021-05-22 ENCOUNTER — Telehealth: Payer: Self-pay

## 2021-05-22 DIAGNOSIS — R9089 Other abnormal findings on diagnostic imaging of central nervous system: Secondary | ICD-10-CM

## 2021-05-22 NOTE — Telephone Encounter (Signed)
Patient advised of MRI results.  CTA of head order sent to [facility].

## 2021-05-22 NOTE — Telephone Encounter (Signed)
-----   Message from Pieter Partridge, DO sent at 05/22/2021  7:34 AM EST ----- MRI does not show anything in the brain that would be causing her symptoms.  There are two incidental findings.  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  It is "just there" and nothing to do.  There is a possible aneurysm. To get a better look, I would like to check a CTA of the head.

## 2021-06-13 ENCOUNTER — Other Ambulatory Visit (HOSPITAL_COMMUNITY): Payer: Self-pay

## 2021-06-16 ENCOUNTER — Telehealth: Payer: Self-pay

## 2021-06-16 NOTE — Telephone Encounter (Signed)
[facility] Patient Advocate Encounter ? ?Patient's medication (Apretude) have been couriered to [facility] from [place] and will be administered on the patient next office visit on 06/25/21. ? ?Ileene Patrick , CPhT ?Specialty Pharmacy Patient Advocate ?[place] [facility] ?Phone: [phone] ?Fax:  [phone]  ?

## 2021-06-18 ENCOUNTER — Other Ambulatory Visit: Payer: Self-pay | Admitting: Pharmacist

## 2021-06-18 ENCOUNTER — Telehealth: Payer: Self-pay | Admitting: Internal Medicine

## 2021-06-18 ENCOUNTER — Other Ambulatory Visit: Payer: Self-pay | Admitting: *Deleted

## 2021-06-18 DIAGNOSIS — R768 Other specified abnormal immunological findings in serum: Secondary | ICD-10-CM

## 2021-06-18 DIAGNOSIS — M2550 Pain in unspecified joint: Secondary | ICD-10-CM

## 2021-06-18 DIAGNOSIS — Z79899 Other long term (current) drug therapy: Secondary | ICD-10-CM

## 2021-06-18 DIAGNOSIS — R21 Rash and other nonspecific skin eruption: Secondary | ICD-10-CM

## 2021-06-18 NOTE — Telephone Encounter (Signed)
New RX sent to Dr. Benjamine Mola, [facility] for Dr. Zenia Resides office to fax PLQ eye exam, [facility] at [facility] w/name change. ?

## 2021-06-18 NOTE — Telephone Encounter (Signed)
Patient called the office stating her last name changed so she needs a new prescription with the correct last name. 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 Patient requests a refill of Hydro[facility]loroquine 200mg  [facility] [address] Patient states the pharmacy requested a verbal be called in due to the name change. ?

## 2021-06-18 NOTE — Telephone Encounter (Signed)
Next Visit: 07/21/2021 ? ?Last Visit: 05/20/2021 ? ?Labs: 05/20/2021, CMP w/GFR normal, CBC w/diff RBC 3.31, hemoglobin 10.3, HCT 30.5,  ? ?Eye exam: Will call [facility] to get PLQ eye exam. ? ?Current Dose per office note 05/20/2021: HCQ 200 mg daily  ? ?XT:[id number] ANA (antinuclear antibody) ?Positive anti-CCP test ?Undifferentiated connective tissue disease (HCC) ? ?Last Fill: 05/02/2021 ? ?Okay to refill Plaquenil? ? ?

## 2021-06-19 ENCOUNTER — Ambulatory Visit
Admission: RE | Admit: 2021-06-19 | Discharge: 2021-06-19 | Disposition: A | Discharge status: Home / Self Care | Payer: Medicaid Other | Source: Ambulatory Visit | Attending: Neurology | Admitting: Neurology

## 2021-06-19 DIAGNOSIS — R9089 Other abnormal findings on diagnostic imaging of central nervous system: Secondary | ICD-10-CM

## 2021-06-19 IMAGING — CT CT ANGIO HEAD
1 of 4 series · 4 of 30 positions shown · non-contrast
Comparison: MRI brain with contrast 05/21/2021.  CT head 12/23/2020

CLINICAL DATA: Cerebral aneurysm identified on MRI. Headache and
blurred vision

EXAM:
CT ANGIOGRAPHY HEAD
TECHNIQUE: Multidetector CT imaging of the head was performed using the
standard protocol during bolus administration of intravenous
contrast. Multiplanar CT image reconstructions and MIPs were
obtained to evaluate the vascular anatomy.

[Series 6: head angio · axial · 0.42mm/px · z∈[+1200,+1308]mm · 4 of 60 slices shown]
[im 12/60  brain]
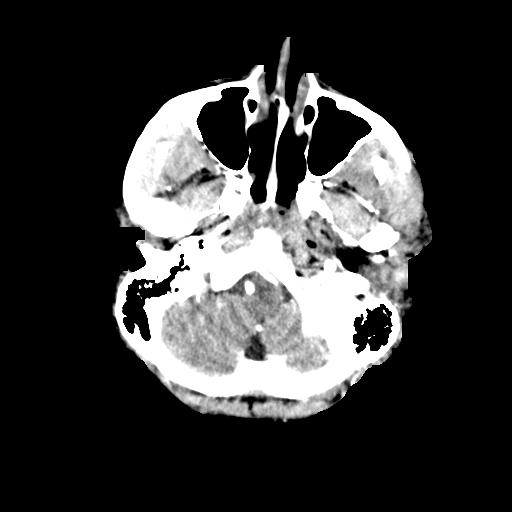
[im 24/60  bone]
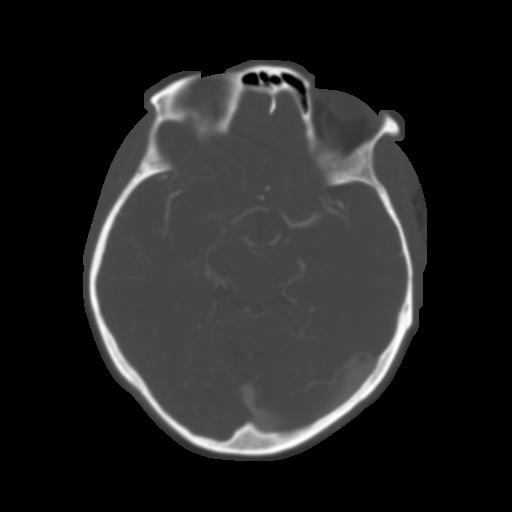
[im 36/60  brain]
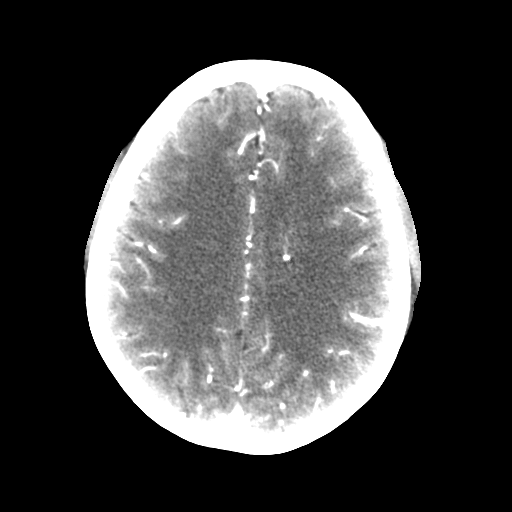
[im 48/60  bone]
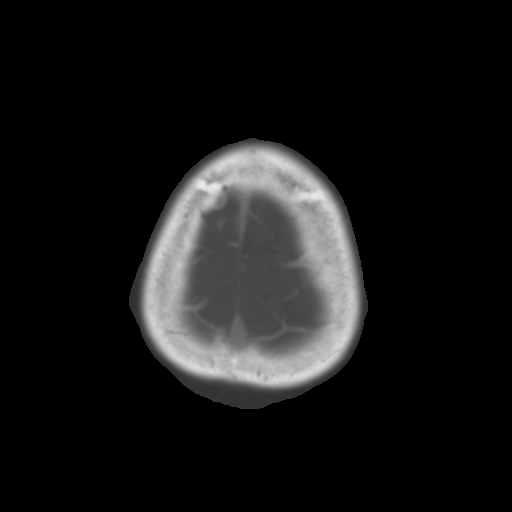

[4 of 30 positions shown; findings below may reference images not displayed]

RADIATION DOSE REDUCTION: This exam was performed according to the
departmental dose-optimization program which includes automated
exposure control, adjustment of the mA and/or kV according to
patient size and/or use of iterative reconstruction technique.

CONTRAST:  75mL NTWZ1A-M9P IOPAMIDOL (NTWZ1A-M9P) INJECTION 76%
FINDINGS: CT HEAD

Brain: No evidence of acute abnormality, including acute infarct,
hemorrhage, hydrocephalus, or mass lesion.

Vascular: Negative for hyperdense vessel

Skull: Negative

Sinuses: Negative

Other: None

CTA HEAD

Anterior circulation: Internal carotid artery widely patent without
atherosclerotic disease. No aneurysm in the cavernous carotid.

3 x 5 mm anterior communicating artery aneurysm as noted on MRI.
This appears to be arising from the left anterior communicating
artery and the aneurysm projects to the right. Smooth contours to
the aneurysm. No irregularity or thrombus.

Anterior and middle cerebral arteries patent without stenosis or
large vessel occlusion.

Posterior circulation: Both vertebral arteries patent to the
basilar. PICA patent bilaterally. Basilar widely patent. Superior
cerebellar and posterior cerebral arteries patent. No stenosis or
aneurysm

Venous sinuses: Normal venous enhancement

Anatomic variants: None

Review of the MIP images confirms the above findings.
IMPRESSION: 1. Negative CT head
2. No intracranial stenosis or atherosclerotic disease
3. 3 x 5 mm left anterior communicating artery aneurysm, projecting
to the right.

## 2021-06-19 MED ORDER — HYDROXYCHLOROQUINE SULFATE 200 MG PO TABS: ORAL_TABLET | ORAL | 2 refills | Status: DC

## 2021-06-19 MED ORDER — IOPAMIDOL (ISOVUE-370) INJECTION 76%
75.0000 mL | Freq: Once | INTRAVENOUS | Status: AC | PRN
  Administered 2021-06-19: 75 mL via INTRAVENOUS

## 2021-06-23 ENCOUNTER — Encounter: Payer: Self-pay | Admitting: Neurology

## 2021-06-24 ENCOUNTER — Other Ambulatory Visit: Payer: Self-pay

## 2021-06-24 DIAGNOSIS — I671 Cerebral aneurysm, nonruptured: Secondary | ICD-10-CM

## 2021-06-24 NOTE — Progress Notes (Signed)
Per Dr.Jaffe refer out to Interventional radiology ?

## 2021-06-25 ENCOUNTER — Other Ambulatory Visit: Payer: Self-pay

## 2021-06-25 ENCOUNTER — Other Ambulatory Visit: Payer: Medicaid Other

## 2021-06-25 ENCOUNTER — Other Ambulatory Visit (HOSPITAL_COMMUNITY)
Admission: RE | Admit: 2021-06-25 | Discharge: 2021-06-25 | Disposition: A | Discharge status: Home / Self Care | Payer: Medicaid Other | Source: Ambulatory Visit | Attending: Infectious Diseases | Admitting: Infectious Diseases

## 2021-06-25 ENCOUNTER — Ambulatory Visit (INDEPENDENT_AMBULATORY_CARE_PROVIDER_SITE_OTHER): Payer: Medicaid Other | Admitting: Pharmacist

## 2021-06-25 ENCOUNTER — Other Ambulatory Visit: Payer: Self-pay | Admitting: Internal Medicine

## 2021-06-25 DIAGNOSIS — Z79899 Other long term (current) drug therapy: Secondary | ICD-10-CM | POA: Diagnosis not present

## 2021-06-25 DIAGNOSIS — M359 Systemic involvement of connective tissue, unspecified: Secondary | ICD-10-CM

## 2021-06-25 DIAGNOSIS — Z23 Encounter for immunization: Secondary | ICD-10-CM | POA: Diagnosis not present

## 2021-06-25 MED ORDER — CABOTEGRAVIR ER 600 MG/3ML IM SUER
600.0000 mg | Freq: Once | INTRAMUSCULAR | Status: AC
  Administered 2021-06-25: 600 mg via INTRAMUSCULAR

## 2021-06-25 NOTE — Progress Notes (Signed)
? ?HPI: Dominique [name] is a 46 y.o. female who presents to the [facility] pharmacy clinic for Apretude administration and HIV PrEP follow up. ? ?Patient Active Problem List  ? Diagnosis Date Noted  ? Bilateral ankle pain 05/20/2021  ? High risk medication use 04/21/2021  ? Herpes 01/23/2021  ? Polyarthralgia 01/23/2021  ? Rash and other nonspecific skin eruption 01/23/2021  ? 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 Blurry vision, bilateral 12/18/2020  ? Smoking 12/18/2020  ? 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 Right hip pain 05/16/2020  ? BRBPR (bright red blood per rectum) 10/18/2019  ? Tobacco use disorder 07/31/2019  ? Dyspnea, paroxysmal nocturnal 07/31/2019  ? Schizophrenia (HCC) 07/31/2019  ? Encounter for screening examination for sexually transmitted disease 07/31/2019  ? Abnormal uterine bleeding (AUB) 05/09/2013  ? BV (bacterial vaginosis) 05/09/2013  ? Dysmenorrhea 05/09/2013  ? ? ?Patient's Medications  ?New Prescriptions  ? No medications on file  ?Previous Medications  ? CABOTEGRAVIR ER (APRETUDE) 600 MG/3ML INJECTION    Inject 3 mLs (600 mg total) into the muscle every 30 (thirty) days.  ? CABOTEGRAVIR ER (APRETUDE) 600 MG/3ML INJECTION    Inject 3 mLs (600 mg total) into the muscle every 2 (two) months.  ? CETIRIZINE (ZYRTEC) 10 MG TABLET    Take 10 mg by mouth daily.  ? DEPO-SUBQ PROVERA 104 104 MG/0.65ML INJECTION    SMARTSIG:Syringe(s) SUB-Q  ? DICLOFENAC SODIUM (VOLTAREN) 1 % GEL    Apply 2 g topically 4 (four) times daily.  ? EMTRICITABINE-TENOFOVIR (TRUVADA) 200-300 MG TABLET    Take 1 tablet by mouth daily.  ? FOLIC ACID (FOLVITE) 1 MG TABLET    Take 1 tablet (1 mg total) by mouth daily.  ? GABAPENTIN (NEURONTIN) 600 MG TABLET    Take 600 mg by mouth 3 (three) times daily.  ? HYDROXYCHLOROQUINE (PLAQUENIL) 200 MG TABLET    TAKE 1 TABLET(200 MG) BY MOUTH DAILY  ? MELOXICAM (MOBIC) 15 MG TABLET    Take 1 tablet (15 mg total) by mouth daily.   ? QUETIAPINE (SEROQUEL) 100 MG TABLET      ?Modified Medications  ? No medications on file  ?Discontinued Medications  ? No medications on file  ? ? ?Allergies: ?No Known Allergies ? ?Past Medical History: ?Past Medical History:  ?Diagnosis Date  ? Kidney stones   ? Lupus (HCC) 01/17/2021  ? Systemic lupus erythematosus (HCC)   ? ? ?Social History: ?Social History  ? ?Socioeconomic History  ? Marital status: Single  ?  Spouse name: Not on file  ? Number of children: Not on file  ? Years of education: Not on file  ? Highest education level: Not on file  ?Occupational History  ? Not on file  ?Tobacco Use  ? Smoking status: Every Day  ?  Packs/day: 0.25  ?  Years: 15.00  ?  Pack years: 3.75  ?  Types: Cigarettes  ? Smokeless tobacco: Never  ?Vaping Use  ? Vaping Use: Never used  ?Substance and Sexual Activity  ? Alcohol use: Not Currently  ? Drug use: Yes  ?  Types: Marijuana  ? Sexual activity: Yes  ?  Partners: Male  ?  Birth control/protection: Surgical, Injection  ?Other Topics Concern  ? Not on file  ?Social History Narrative  ? Right handed  ? ?Social Determinants of Health  ? ?Financial Resource Strain: Not on file  ?Food Insecurity: Not on file  ?Transportation Needs:  Not on file  ?Physical Activity: Not on file  ?Stress: Not on file  ?Social Connections: Not on file  ? ? ?Labs: ?Lab Results  ?Component Value Date  ? HIV1RNAQUANT Not Detected 05/02/2021  ? HIV1RNAQUANT Not Detected 03/07/2021  ? HIV1RNAQUANT Not Detected 01/30/2021  ? ? ?RPR and STI ?Lab Results  ?Component Value Date  ? LABRPR REACTIVE (A) 12/18/2020  ? LABRPR Non Reactive 10/16/2019  ? RPRTITER 1:16 (H) 12/18/2020  ? ? ?STI Results GC GC CT CT  ?01/30/2021 ?10:14 AM Negative    Negative     ?10/16/2019 ? 3:39 PM Negative    Negative     ?05/09/2013 ?10:57 AM  NEGATIVE    NEGATIVE    ?12/07/2011 ?12:20 PM   NEGATIVE     ? ? ?Hepatitis B ?Lab Results  ?Component Value Date  ? HEPBSAB NON-REACTIVE 12/18/2020  ? HEPBSAG NON-REACTIVE 12/18/2020  ?  HEPBCAB NON-REACTIVE 12/18/2020  ? ?Hepatitis C ?Lab Results  ?Component Value Date  ? HEPCAB NON-REACTIVE 12/18/2020  ? ?Hepatitis A ?No results found for: HAV ?Lipids: ?Lab Results  ?Component Value Date  ? CHOL 154 07/28/2019  ? TRIG 96 07/28/2019  ? HDL 57 07/28/2019  ? CHOLHDL 2.7 07/28/2019  ? LDLCALC 79 07/28/2019  ? ? ?TARGET DATE: The 3rd of the month ? ?Current PrEP Regimen: ?Apretude q66mo  ? ?Assessment: ?Dominique [name] presents today for their maintenance injection of Apretude and to follow up for HIV PrEP. Rapid HIV blood test was drawn immediately prior to injection and was negative. Patient also had HIV RNA drawn today.  ? ?Administered cabotegravir 600mg /12mL in right upper outer quadrant of the gluteal muscle. Will make follow up appointments for maintenance injections every 2 months.  ? ?Dominique [name] denies any recent sexual encounters since her last visit. Will defer cytologies today. RPR was 1:16 in Sept. 2022. Will repeat RPR today.  ? ?She is also due for a Hep B vaccine as her Hep B antibody was non-reactive on 12/18/20. She is agreeable to getting vaccinated today.  ? ?Plan: ?- Maintenance injections scheduled for 08/28/21 and 10/30/21 with Marchelle Folks ?- HIV RNA ?- RPR ?- Hep B vaccine ?- Second Hep B vaccine at next visit on 08/28/21 ?- Call with any issues or questions ? ?Valeda Malm, Pharm.D. ?PGY-1 Pharmacy Resident ?06/25/2021 11:39 AM ? ?

## 2021-06-25 NOTE — Telephone Encounter (Signed)
Next Visit: 07/21/2021 ? ?Last Visit: 05/20/2021 ? ?Last Fill: 04/22/2021 ? ?Dx: Positive ANA (antinuclear antibody) ? ?Current Dose per office note on 05/20/2021: not mentioned ? ?Okay to refill folic acid?  ? ?

## 2021-06-26 LAB — URINE CYTOLOGY ANCILLARY ONLY
Chlamydia: NEGATIVE
Comment: NEGATIVE
Comment: NORMAL
Neisseria Gonorrhea: NEGATIVE

## 2021-06-26 MED ORDER — FOLIC ACID 1 MG PO TABS: 1.0000 mg | ORAL_TABLET | Freq: Every day | ORAL | 3 refills | Status: AC

## 2021-06-27 ENCOUNTER — Encounter: Payer: Self-pay | Admitting: Infectious Diseases

## 2021-06-28 LAB — HIV-1 RNA QUANT-NO REFLEX-BLD
HIV 1 RNA Quant: NOT DETECTED Copies/mL
HIV-1 RNA Quant, Log: NOT DETECTED Log cps/mL

## 2021-06-28 LAB — FLUORESCENT TREPONEMAL AB(FTA)-IGG-BLD: Fluorescent Treponemal ABS: REACTIVE — AB

## 2021-06-28 LAB — RPR TITER: RPR Titer: 1:8 {titer} — ABNORMAL HIGH

## 2021-06-28 LAB — RPR: RPR Ser Ql: REACTIVE — AB

## 2021-07-01 ENCOUNTER — Other Ambulatory Visit (HOSPITAL_COMMUNITY): Payer: Self-pay | Admitting: Interventional Radiology

## 2021-07-01 DIAGNOSIS — I671 Cerebral aneurysm, nonruptured: Secondary | ICD-10-CM

## 2021-07-04 DIAGNOSIS — E0590 Thyrotoxicosis, unspecified without thyrotoxic crisis or storm: Secondary | ICD-10-CM | POA: Insufficient documentation

## 2021-07-08 ENCOUNTER — Ambulatory Visit (HOSPITAL_COMMUNITY)
Admission: RE | Admit: 2021-07-08 | Discharge: 2021-07-08 | Disposition: A | Discharge status: Home / Self Care | Payer: Medicaid Other | Source: Ambulatory Visit | Attending: Interventional Radiology | Admitting: Interventional Radiology

## 2021-07-08 DIAGNOSIS — I671 Cerebral aneurysm, nonruptured: Secondary | ICD-10-CM

## 2021-07-10 HISTORY — PX: IR RADIOLOGIST EVAL & MGMT: IMG5224

## 2021-07-11 ENCOUNTER — Telehealth (HOSPITAL_COMMUNITY): Payer: Self-pay | Admitting: Radiology

## 2021-07-11 NOTE — Telephone Encounter (Signed)
Called pt to let her know that I have not been able to speak with Dr. Corliss Skains concerning scheduling her brain aneurysm treatment and her blood thinners. I will call her as soon as I can get with him. Deveshwar has been in back to back stroke cases today thus far. JM ?

## 2021-07-14 ENCOUNTER — Other Ambulatory Visit: Payer: Self-pay | Admitting: Student

## 2021-07-14 DIAGNOSIS — I671 Cerebral aneurysm, nonruptured: Secondary | ICD-10-CM

## 2021-07-14 MED ORDER — CLOPIDOGREL BISULFATE 75 MG PO TABS: 75.0000 mg | ORAL_TABLET | Freq: Every day | ORAL | 0 refills | Status: DC

## 2021-07-17 ENCOUNTER — Other Ambulatory Visit (HOSPITAL_COMMUNITY): Payer: Self-pay | Admitting: Interventional Radiology

## 2021-07-17 DIAGNOSIS — I671 Cerebral aneurysm, nonruptured: Secondary | ICD-10-CM

## 2021-07-20 NOTE — Progress Notes (Signed)
? ?Office Visit Note ? ?Patient: Dominique RoachShanetta M [name]             ?Date of Birth: Sep 16, 1975           ?MRN: [id number]             ?PCP: Dot BeenYaya, Irene J, FNP ?Referring: Dot BeenYaya, Irene J, FNP ?Visit Date: 07/21/2021 ? ? ?Subjective:  ?Follow-up (Pain) ? ?History of Present Illness: Dominique RoachShanetta M [name] is a 46 y.o. 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 So far she still has about the same amount of pain worst affected areas in both heels and in mid feet. Plantar fasciitis exercises mildly helpful for the bottom of the feet but not much. 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  ? ?Previous HPI ?05/20/21 ?Dominique KittenShanetta M [name] is a 46 y.o. 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 So far her symptoms are about the same. She continues having pain worst in her feet and less extent along medial side of knees. She does not see much swelling or discoloration just pain. Has not noticed any side effects of the methotrexate. ?  ?Previous HPI ?01/23/21 ?Dominique KittenShanetta M [name] is a 46 y.o. female here for evaluation of positive ANA and elevated sedimentation rate with multiple systemic symptoms including weight loss, lymphadenopathy, paresthesias, joint pain, and headaches.  She started noticing hyperpigmented skin rashes developing on her face and extremities since several months ago does not recall specific onset or provoking episode.  Then developed worsening joint pain in multiple sites but started around July of this year.  This involved pain affecting both hands also the hips knees and feet bilaterally.  She described morning stiffness lasting 2 or 3 hours in duration also especially pain in the bottoms of the feet first and getting out of bed.  After a month with these ongoing and somewhat worsening symptoms she was seen in primary care clinic. Workup at that  time positive for trichomoniasis and syphilis and starting antibiotics also found to have high sedimentation rate with positive ANA and CCP Abs. She followed up for continued treatment last month and more recently in ID clinic with completion of antibiotic treatment for syphilis with Bicillin.  Neurology evaluation with lumbar puncture that was not suggestive for active neurosyphilis. She saw ophthalmology for visual change symptoms reports findings were not concerning for infectious or inflammatory type changes. ? ? ?Review of Systems  ?Constitutional:  Positive for fatigue.  ?HENT:  Negative for mouth dryness.   ?Eyes:  Negative for dryness.  ?Respiratory:  Positive for shortness of breath.   ?Cardiovascular:  Positive for swelling in legs/feet.  ?Gastrointestinal:  Negative for constipation.  ?Endocrine: Negative for excessive thirst.  ?Genitourinary:  Negative for difficulty urinating.  ?Musculoskeletal:  Positive for joint pain, joint pain, joint swelling, muscle weakness, morning stiffness and muscle tenderness.  ?Skin:  Negative for rash.  ?Allergic/Immunologic: Negative for susceptible to infections.  ?Neurological:  Positive for numbness and weakness.  ?Hematological:  Negative for bruising/bleeding tendency.  ?Psychiatric/Behavioral:  Positive for sleep disturbance.   ? ?PMFS History:  ?Patient Active Problem List  ? Diagnosis Date Noted  ? Brain aneurysm 07/28/2021  ? Bilateral ankle pain 05/20/2021  ? High risk medication use 04/21/2021  ? Herpes 01/23/2021  ? Polyarthralgia 01/23/2021  ? Rash  and other nonspecific skin eruption 01/23/2021  ? 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 Blurry vision, bilateral 12/18/2020  ? Smoking 12/18/2020  ? 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 Right hip pain 05/16/2020  ? BRBPR (bright red blood per rectum) 10/18/2019  ? Tobacco use disorder 07/31/2019  ? Dyspnea, paroxysmal nocturnal 07/31/2019  ? Schizophrenia  (HCC) 07/31/2019  ? Encounter for screening examination for sexually transmitted disease 07/31/2019  ? Abnormal uterine bleeding (AUB) 05/09/2013  ? BV (bacterial vaginosis) 05/09/2013  ? Dysmenorrhea 05/09/2013  ?  ?Past Medical History:  ?Diagnosis Date  ? Aneurysm (HCC)   ? Arthritis   ? RA  ? Kidney stones   ? Lupus (HCC) 01/17/2021  ? Schizophrenia (HCC)   ? Systemic lupus erythematosus (HCC)   ?  ?Family History  ?Problem Relation Age of Onset  ? Stroke Mother   ? Multiple sclerosis Father   ? Hypertension Father   ? Diabetes Father   ? Asthma Brother   ? Dementia Maternal Grandfather   ? ?Past Surgical History:  ?Procedure Laterality Date  ? CESAREAN SECTION    ? HERNIA REPAIR    ? IR 3D INDEPENDENT WKST  07/28/2021  ? IR ANGIO INTRA EXTRACRAN SEL INTERNAL CAROTID BILAT MOD SED  07/28/2021  ? IR ANGIO VERTEBRAL SEL VERTEBRAL UNI L MOD SED  07/28/2021  ? IR ANGIOGRAM FOLLOW UP STUDY  07/28/2021  ? IR CT HEAD LTD  07/28/2021  ? IR NEURO EACH ADD'L AFTER BASIC UNI LEFT (MS)  07/28/2021  ? IR RADIOLOGIST EVAL & MGMT  07/10/2021  ? IR TRANSCATH/EMBOLIZ  07/28/2021  ? IR US GUIDE VASC ACCESS RIGHT  07/28/2021  ? RADIOLOGY WITH ANESTHESIA N/A 07/28/2021  ? Procedure: EMBOLIZATION;  Surgeon: Julieanne Cotton, MD;  Location: [facility] OR;  Service: Radiology;  Laterality: N/A;  ? ?Social History  ? ?Social History Narrative  ? Right handed  ? ?Immunization History  ?Administered Date(s) Administered  ? Hepb-cpg 06/25/2021  ? Influenza,inj,Quad PF,6+ Mos 01/30/2021  ?  ? ?Objective: ?Vital Signs: BP 105/65 (BP Location: Left Arm, Patient Position: Sitting, Cuff Size: Normal)   Pulse 79   Resp 15   Ht 5' (1.524 m)   Wt 122 lb (55.3 kg)   BMI 23.83 kg/m?   ? ?Physical Exam ?Cardiovascular:  ?   Rate and Rhythm: Normal rate and regular rhythm.  ?Pulmonary:  ?   Effort: Pulmonary effort is normal.  ?   Breath sounds: Normal breath sounds.  ?Skin: ?   General: Skin is warm and dry.  ?   Comments: Irregular patches of hypopigmentation on face  and upper body  ?Neurological:  ?   Mental Status: She is alert.  ?Psychiatric:     ?   Mood and Affect: Mood normal.  ?  ? ?Musculoskeletal Exam:  ?Wrists full ROM no tenderness or swelling ?Fingers full ROM no tenderness or swelling ?Bilateral knee tenderness to pressure worse on medial side, no effusions, ROM is intact ?Bilateral ankle tenderness at achilles tendons no palpable nodules or welling and ROM is normal ? ? ?Investigation: ?No additional findings. ? ?Imaging: ?IR Transcath/Emboliz ? ?Result Date: 07/30/2021 ?CLINICAL DATA:  History of worsening headaches. Strong family history of ruptured and unruptured brain aneurysms. EXAM: TRANSCATHETER THERAPY EMBOLIZATION COMPARISON:  MRA brain of 05/21/2021 and CT angiogram of the head and neck of 06/19/2021. MEDICATIONS: Heparin 3,000 units IV. Ancef 2 g IV antibiotic was administered within 1 hour of  the procedure. ANESTHESIA/SEDATION: General anesthesia. CONTRAST:  Omnipaque 300 approximately 110 mL. FLUOROSCOPY TIME:  Fluoroscopy Time: 26 minutes 6 seconds (2338 mGy). COMPLICATIONS: None immediate. TECHNIQUE: Informed written consent was obtained from the patient after a thorough discussion of the procedural risks, benefits and alternatives. All questions were addressed. Maximal Sterile Barrier Technique was utilized including caps, mask, sterile gowns, sterile gloves, sterile drape, hand hygiene and skin antiseptic. A timeout was performed prior to the initiation of the procedure. The right forearm to the wrist was prepped and draped in the usual sterile manner. The radial artery morphology was then identified with ultrasound and document in the PACS system. A dorsal palmar anastomosis was verified to be present. Using ultrasound guidance, access into the right radial artery was obtained over a 0.018 inch micro guidewire. A 6/7 radial sheath was then inserted without event. The micro guidewire, and obturator were removed. Good aspiration obtained from the side  port of the radial sheath. A cocktail of 2.5 mg of verapamil, 2000 units of heparin, and 200 mcg of nitroglycerin was then infused in diluted form without event. A right radial arteriogram was then performed.

## 2021-07-21 ENCOUNTER — Other Ambulatory Visit (HOSPITAL_COMMUNITY): Payer: Self-pay

## 2021-07-21 ENCOUNTER — Other Ambulatory Visit (HOSPITAL_COMMUNITY): Payer: Self-pay | Admitting: Radiology

## 2021-07-21 ENCOUNTER — Ambulatory Visit (INDEPENDENT_AMBULATORY_CARE_PROVIDER_SITE_OTHER): Payer: Medicaid Other | Admitting: Internal Medicine

## 2021-07-21 ENCOUNTER — Encounter: Payer: Self-pay | Admitting: Internal Medicine

## 2021-07-21 VITALS — BP 105/65 | HR 79 | Resp 15 | Ht 60.0 in | Wt 122.0 lb

## 2021-07-21 DIAGNOSIS — M2550 Pain in unspecified joint: Secondary | ICD-10-CM | POA: Diagnosis not present

## 2021-07-21 DIAGNOSIS — A523 Neurosyphilis, unspecified: Secondary | ICD-10-CM | POA: Diagnosis not present

## 2021-07-21 DIAGNOSIS — M359 Systemic involvement of connective tissue, unspecified: Secondary | ICD-10-CM

## 2021-07-21 DIAGNOSIS — I671 Cerebral aneurysm, nonruptured: Secondary | ICD-10-CM

## 2021-07-21 DIAGNOSIS — Z79899 Other long term (current) drug therapy: Secondary | ICD-10-CM

## 2021-07-21 NOTE — Patient Instructions (Signed)
Sulfasalazine Tablets ?What is this medication? ?SULFASALAZINE (sul fa SAL a zeen) treats ulcerative colitis. It works by decreasing inflammation. It belongs to a group of medications called salicylates. ?This medicine may be used for other purposes; ask your health care provider or pharmacist if you have questions. ?COMMON BRAND NAME(S): Azulfidine, Sulfazine ?What should I tell my care team before I take this medication? ?They need to know if you have any of these conditions: ?Asthma ?Blood disorders or anemia ?Glucose-6-phosphate dehydrogenase (G6PD) deficiency ?Intestinal obstruction ?Kidney disease ?Liver disease ?Porphyria ?Urinary tract obstruction ?An unusual reaction to sulfasalazine, sulfa medications, salicylates, or other medications, foods, dyes, or preservatives ?Pregnant or trying to get pregnant ?Breast-feeding ?How should I use this medication? ?Take this medication by mouth with a full glass of water. Take it as directed on the prescription label at the same time every day. You can take it with or without food. If it upsets your stomach, take it with food. Keep taking it unless your care team tells you to stop. ?Talk to your care team about the use of this medication in children. While this medication may be prescribed for children as young as 6 years for selected conditions, precautions do apply. ?Patients over 65 years old may have a stronger reaction and need a smaller dose. ?Overdosage: If you think you have taken too much of this medicine contact a poison control center or emergency room at once. ?NOTE: This medicine is only for you. Do not share this medicine with others. ?What if I miss a dose? ?If you miss a dose, take it as soon as you can. If it is almost time for your next dose, take only that dose. Do not take double or extra doses. ?What may interact with this medication? ?Digoxin ?Folic acid ?This list may not describe all possible interactions. Give your health care provider a list  of all the medicines, herbs, non-prescription drugs, or dietary supplements you use. Also tell them if you smoke, drink alcohol, or use illegal drugs. Some items may interact with your medicine. ?What should I watch for while using this medication? ?Visit your care team for regular checks on your progress. Tell your care team if your symptoms do not start to get better or if they get worse. ?You will need frequent blood and urine checks. ?This medication can make you more sensitive to the sun. Keep out of the sun. If you cannot avoid being in the sun, wear protective clothing and use sunscreen. Do not use sun lamps or tanning beds/booths. ?Drink plenty of water while taking this medication. ?What side effects may I notice from receiving this medication? ?Side effects that you should report to your care team as soon as possible: ?Allergic reactions--skin rash, itching, hives, swelling of the face, lips, tongue, or throat ?Aplastic anemia--unusual weakness or fatigue, dizziness, headache, trouble breathing, increased bleeding or bruising ?Dry cough, shortness of breath or trouble breathing ?Heart muscle inflammation--unusual weakness or fatigue, shortness of breath, chest pain, fast or irregular heartbeat, dizziness, swelling of the ankles, feet, or hands ?Infection--fever, chills, cough, sore throat, wounds that don't heal, pain or trouble when passing urine, general feeling of discomfort or being unwell ?Kidney injury--decrease in the amount of urine, swelling of the ankles, hands, or feet ?Liver injury--right upper belly pain, loss of appetite, nausea, light-colored stool, dark yellow or brown urine, yellowing skin or eyes, unusual weakness or fatigue ?Rash, fever, and swollen lymph nodes ?Redness, blistering, peeling, or loosening of the skin, including inside   the mouth ?Side effects that usually do not require medical attention (report to your care team if they continue or are bothersome): ?Dark yellow or orange  saliva, sweat, or urine ?Dizziness ?Headache ?Loss of appetite ?Nausea ?Upset stomach ?Vomiting ?This list may not describe all possible side effects. Call your doctor for medical advice about side effects. You may report side effects to FDA at 1-800-FDA-1088. ?Where should I keep my medication? ?Keep out of the reach of children and pets. ?Store at room temperature between 15 and 30 degrees C (59 and 86 degrees F). Get rid of any unused medication after the expiration date. ?To get rid of medications that are no longer needed or have expired: ?Take the medications to a medication take-back program. Check with your pharmacy or law enforcement to find a location. ?If you cannot return the medication, check the label or package insert to see if the medication should be thrown out in the garbage or flushed down the toilet. If you are not sure, ask your care team. If it is safe to put it in the trash, take the medication out of the container. Mix the medication with cat litter, dirt, coffee grounds, or other unwanted substance. Seal the mixture in a bag or container. Put it in the trash. ?NOTE: This sheet is a summary. It may not cover all possible information. If you have questions about this medicine, talk to your doctor, pharmacist, or health care provider. ?? 2023 Elsevier/Gold Standard (2021-02-06 00:00:00) ? ?

## 2021-07-23 LAB — PLATELET INHIBITION P2Y12

## 2021-07-24 ENCOUNTER — Encounter (HOSPITAL_COMMUNITY): Payer: Self-pay | Admitting: Interventional Radiology

## 2021-07-25 ENCOUNTER — Other Ambulatory Visit: Payer: Self-pay | Admitting: Student

## 2021-07-25 ENCOUNTER — Encounter (HOSPITAL_COMMUNITY): Payer: Self-pay | Admitting: Interventional Radiology

## 2021-07-25 NOTE — Progress Notes (Signed)
TWO VISITORS ARE ALLOWED TO COME WITH YOU AND STAY IN THE SURGICAL WAITING ROOM ONLY DURING PRE OP AND PROCEDURE DAY OF SURGERY.  ? ?Two VISITORS MAY VISIT WITH YOU AFTER SURGERY IN YOUR PRIVATE ROOM DURING VISITING HOURS ONLY! ? ?PCP - Sherron Flemings, FNP ?Cardiologist - n/a ?Rheumatology - Dr Sheliah Hatch ?   ?Chest x-ray - n/a ?EKG - n/a ?Stress Test - n/a ?ECHO - n/a ?Cardiac Cath - n/a ? ?ICD Pacemaker/Loop - n/a ? ?Sleep Study -  n/a ?CPAP - none ? ?Blood Thinner Instructions:  Follow your surgeon's instructions on when to stop Plavix prior to surgery.  Last dose will be on 07/26/21 per patient. ? ?Anesthesia review: Yes ? ?STOP now taking any Aspirin (unless otherwise instructed by your surgeon), Aleve, Naproxen, Ibuprofen, Motrin, Advil, Goody's, BC's, all herbal medications, fish oil, and all vitamins.  ? ?Coronavirus Screening ?Do you have any of the following symptoms:  ?Cough yes/no: No ?Fever (>100.33F)  yes/no: No ?Runny nose yes/no: No ?Sore throat yes/no: No ?Difficulty breathing/shortness of breath  yes/no: No ? ?Have you traveled in the last 14 days and where? yes/no: No ? ?Patient verbalized understanding of instructions that were given via phone. ?

## 2021-07-25 NOTE — Progress Notes (Signed)
Anesthesia Chart Review: ? ? Case: [id number] Date/Time: 07/28/21 0830  ? Procedure: EMBOLIZATION  ? Anesthesia type: General  ? Pre-op diagnosis: BRAIN ANEURYSM  ? [facility]  ? Surgeons: Julieanne Cottoneveshwar, Sanjeev, MD  ? ?  ? ? 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  ? ?Per neurology note 03/11/21 patient in 2022 experienced sharp pain and numbness in feet. It radiated up to her calves and knees and caused her difficulty walking.  She also had numbness in hands.  Blood work was positive for syphilis (RPR)." Given her symptoms, she underwent lumbar puncture to test for neuro-syphilis, which demonstrated a negative VDRL. CT head on 12/23/2020 personally reviewed was negative." Patient was to undergo nerve conduction studies and EMG testing, however she had an incomplete study because testing was terminated due to pain. ? ?MRI of brain w/wo contrast was order by neurology and it "did not show anything in brain that would be causing her symptoms. There are two incidental findings." CTA head ordered to further assess. 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 ? ?She is followed by rheumatology and is on methotrexate and was last seen on 07/21/21. At visit still complaining of pain in heels and feet.  ? ?VS: Ht 5' (1.524 m)   Wt 55.3 kg   BMI 23.83 kg/m?  ? ?PROVIDERS: ?Dot BeenYaya, Irene J, FNP ? ? ?LABS:  ? ?P2Y12 from 07/22/21: 156 ?Of note H/H in 05/2019 was 10.3 and 30.5  ? ?Labs Reviewed  ?PLATELET INHIBITION P2Y12  ? ? ? ?IMAGES: ?CTA head w/wo contrast (06/19/21) ?IMPRESSION: ?1. Negative CT head ?2. No intracranial stenosis or atherosclerotic disease ?3. 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 ? ? ?Past Medical History:  ?Diagnosis Date  ? Aneurysm (HCC)   ? Arthritis   ? RA  ? Kidney stones   ? Lupus (HCC) 01/17/2021  ? Schizophrenia (HCC)   ? Systemic lupus erythematosus (HCC)   ? ? ?Past Surgical History:  ?Procedure Laterality Date  ? CESAREAN SECTION    ? HERNIA REPAIR    ? IR RADIOLOGIST EVAL & MGMT  07/10/2021  ? ? ?MEDICATIONS: ?No current facility-administered medications for this encounter.  ? ? cabotegravir ER (APRETUDE) 600 MG/3ML injection  ? capsaicin (ZOSTRIX) 0.025 % cream  ? cetirizine (ZYRTEC) 10 MG tablet  ? clopidogrel (PLAVIX) 75 MG tablet  ? DEPO-SUBQ PROVERA 104 104 MG/0.65ML injection  ? diclofenac Sodium (VOLTAREN) 1 % GEL  ? folic acid (FOLVITE) 1 MG tablet  ? gabapentin (NEURONTIN) 600 MG tablet  ? hydroxychloroquine (PLAQUENIL) 200 MG tablet  ? meloxicam (MOBIC) 15 MG tablet  ? QUEtiapine (SEROQUEL) 100 MG tablet  ? Vitamin D, Ergocalciferol, (DRISDOL) 1.25 MG (50000 UNIT) CAPS capsule  ? cabotegravir ER (APRETUDE) 600 MG/3ML injection  ? emtricitabine-tenofovir (TRUVADA) 200-300 MG tablet  ? ? ? ? ?Dominique PotashAli Armonte Tortorella, DNP, CRNA, NP-C ?Short Stay/Anesthesia ? ? ? ? ?

## 2021-07-25 NOTE — Anesthesia Preprocedure Evaluation (Addendum)
Anesthesia Evaluation  ?Patient identified by MRN, date of birth, ID band ?Patient awake ? ? ? ?Reviewed: ?Allergy & Precautions, NPO status , Patient's Chart, lab work & pertinent test results ? ?Airway ?Mallampati: II ? ?TM Distance: >3 FB ?Neck ROM: Full ? ? ? Dental ?no notable dental hx. ? ?  ?Pulmonary ?former smoker,  ?  ?Pulmonary exam normal ? ? ? ? ? ? ? Cardiovascular ?negative cardio ROS ? ? ?Rhythm:Regular Rate:Normal ? ? ?  ?Neuro/Psych ?Schizophrenia aneurysm ?  ? GI/Hepatic ?negative GI ROS, Neg liver ROS,   ?Endo/Other  ?negative endocrine ROS ? Renal/GU ?Renal disease  ?negative genitourinary ?  ?Musculoskeletal ? ?(+) Arthritis , Osteoarthritis,  SLE  ? Abdominal ?Normal abdominal exam  (+)   ?Peds ? Hematology ? ?(+) Blood dyscrasia, , Syphilis    ?Anesthesia Other Findings ? ? Reproductive/Obstetrics ? ?  ? ? ? ? ? ? ? ? ? ? ? ? ? ?  ?  ? ? ? ? ? ?Please refer to progress note 07/25/21 for Anesthesia Chart Review. Junie Bame, DNP, CRNA, NP-C ?Short Stay/Anesthesia ? ?Anesthesia Physical ?Anesthesia Plan ? ?ASA: 3 ? ?Anesthesia Plan: General  ? ?Post-op Pain Management:   ? ?Induction: Intravenous ? ?PONV Risk Score and Plan: 3 and Ondansetron, Dexamethasone, Midazolam and Treatment may vary due to age or medical condition ? ?Airway Management Planned: Mask and Oral ETT ? ?Additional Equipment: Arterial line ? ?Intra-op Plan:  ? ?Post-operative Plan: Extubation in OR ? ?Informed Consent: I have reviewed the patients History and Physical, chart, labs and discussed the procedure including the risks, benefits and alternatives for the proposed anesthesia with the patient or authorized representative who has indicated his/her understanding and acceptance.  ? ? ? ?Dental advisory given ? ?Plan Discussed with: CRNA ? ?Anesthesia Plan Comments: (Lab Results ?     Component                Value               Date                 ?     WBC                      7.9                  05/20/2021           ?     HGB                      10.3 (L)            05/20/2021           ?     HCT                      30.5 (L)            05/20/2021           ?     MCV                      92.1                05/20/2021           ?     PLT  221                 05/20/2021           ?Lab Results ?     Component                Value               Date                 ?     NA                       140                 05/20/2021           ?     K                        3.9                 05/20/2021           ?     CO2                      27                  05/20/2021           ?     GLUCOSE                  87                  05/20/2021           ?     BUN                      9                   05/20/2021           ?     CREATININE               0.80                05/20/2021           ?     CALCIUM                  9.1                 05/20/2021           ?     EGFR                     93                  05/20/2021           ?     GFRNONAA                 >60                 02/27/2021          )  ? ? ? ? ? ?Anesthesia Quick Evaluation ? ?

## 2021-07-26 ENCOUNTER — Other Ambulatory Visit: Payer: Self-pay | Admitting: Physician Assistant

## 2021-07-26 MED ORDER — DEXTROSE 5 % IV SOLN: 2.0000 g | INTRAVENOUS | Status: DC

## 2021-07-28 ENCOUNTER — Encounter (HOSPITAL_COMMUNITY): Payer: Self-pay | Admitting: Interventional Radiology

## 2021-07-28 ENCOUNTER — Encounter (HOSPITAL_COMMUNITY)
Admission: RE | Disposition: A | Discharge status: Home / Self Care | Payer: Self-pay | Source: Home / Self Care | Attending: Interventional Radiology

## 2021-07-28 ENCOUNTER — Ambulatory Visit (HOSPITAL_COMMUNITY): Payer: Medicaid Other | Admitting: Anesthesiology

## 2021-07-28 ENCOUNTER — Other Ambulatory Visit: Payer: Self-pay

## 2021-07-28 ENCOUNTER — Ambulatory Visit (HOSPITAL_COMMUNITY)
Admission: RE | Admit: 2021-07-28 | Discharge: 2021-07-28 | Disposition: A | Discharge status: Home / Self Care | Payer: Medicaid Other | Source: Ambulatory Visit | Attending: Interventional Radiology | Admitting: Interventional Radiology

## 2021-07-28 ENCOUNTER — Inpatient Hospital Stay (HOSPITAL_COMMUNITY)
Admission: RE | Admit: 2021-07-28 | Discharge: 2021-07-29 | DRG: 027 | Disposition: A | Discharge status: Home / Self Care | Payer: Medicaid Other | Attending: Interventional Radiology | Admitting: Interventional Radiology

## 2021-07-28 DIAGNOSIS — Z793 Long term (current) use of hormonal contraceptives: Secondary | ICD-10-CM | POA: Diagnosis not present

## 2021-07-28 DIAGNOSIS — M329 Systemic lupus erythematosus, unspecified: Secondary | ICD-10-CM | POA: Diagnosis present

## 2021-07-28 DIAGNOSIS — F209 Schizophrenia, unspecified: Secondary | ICD-10-CM

## 2021-07-28 DIAGNOSIS — I671 Cerebral aneurysm, nonruptured: Secondary | ICD-10-CM

## 2021-07-28 DIAGNOSIS — M1990 Unspecified osteoarthritis, unspecified site: Secondary | ICD-10-CM | POA: Diagnosis not present

## 2021-07-28 DIAGNOSIS — G8929 Other chronic pain: Secondary | ICD-10-CM | POA: Diagnosis present

## 2021-07-28 DIAGNOSIS — Z823 Family history of stroke: Secondary | ICD-10-CM | POA: Diagnosis not present

## 2021-07-28 DIAGNOSIS — A539 Syphilis, unspecified: Secondary | ICD-10-CM | POA: Diagnosis not present

## 2021-07-28 DIAGNOSIS — Z8249 Family history of ischemic heart disease and other diseases of the circulatory system: Secondary | ICD-10-CM

## 2021-07-28 DIAGNOSIS — M069 Rheumatoid arthritis, unspecified: Secondary | ICD-10-CM | POA: Diagnosis present

## 2021-07-28 DIAGNOSIS — Z79899 Other long term (current) drug therapy: Secondary | ICD-10-CM | POA: Diagnosis not present

## 2021-07-28 DIAGNOSIS — Z7902 Long term (current) use of antithrombotics/antiplatelets: Secondary | ICD-10-CM

## 2021-07-28 DIAGNOSIS — Z87891 Personal history of nicotine dependence: Secondary | ICD-10-CM | POA: Diagnosis not present

## 2021-07-28 HISTORY — PX: IR ANGIO INTRA EXTRACRAN SEL INTERNAL CAROTID BILAT MOD SED: IMG5363

## 2021-07-28 HISTORY — PX: IR TRANSCATH/EMBOLIZ: IMG695

## 2021-07-28 HISTORY — DX: Schizophrenia, unspecified: F20.9

## 2021-07-28 HISTORY — DX: Unspecified osteoarthritis, unspecified site: M19.90

## 2021-07-28 HISTORY — PX: IR US GUIDE VASC ACCESS RIGHT: IMG2390

## 2021-07-28 HISTORY — PX: IR NEURO EACH ADD'L AFTER BASIC UNI LEFT (MS): IMG5373

## 2021-07-28 HISTORY — PX: IR 3D INDEPENDENT WKST: IMG2385

## 2021-07-28 HISTORY — PX: IR ANGIO VERTEBRAL SEL VERTEBRAL UNI L MOD SED: IMG5367

## 2021-07-28 HISTORY — PX: IR CT HEAD LTD: IMG2386

## 2021-07-28 HISTORY — PX: IR ANGIOGRAM FOLLOW UP STUDY: IMG697

## 2021-07-28 HISTORY — PX: RADIOLOGY WITH ANESTHESIA: SHX6223

## 2021-07-28 LAB — PROTIME-INR
INR: 1 (ref 0.8–1.2)
Prothrombin Time: 12.5 seconds (ref 11.4–15.2)

## 2021-07-28 LAB — BASIC METABOLIC PANEL
Anion gap: 7 (ref 5–15)
BUN: 5 mg/dL — ABNORMAL LOW (ref 6–20)
CO2: 22 mmol/L (ref 22–32)
Calcium: 8.8 mg/dL — ABNORMAL LOW (ref 8.9–10.3)
Chloride: 110 mmol/L (ref 98–111)
Creatinine, Ser: 0.7 mg/dL (ref 0.44–1.00)
GFR, Estimated: >60 mL/min (ref >60)
Glucose, Bld: 104 mg/dL — ABNORMAL HIGH (ref 70–99)
Potassium: 3.4 mmol/L — ABNORMAL LOW (ref 3.5–5.1)
Sodium: 139 mmol/L (ref 135–145)

## 2021-07-28 LAB — POCT PREGNANCY, URINE: Preg Test, Ur: NEGATIVE

## 2021-07-28 LAB — CBC
HCT: 30.4 % — ABNORMAL LOW (ref 36.0–46.0)
Hemoglobin: 10.3 g/dL — ABNORMAL LOW (ref 12.0–15.0)
MCH: 32.5 pg (ref 26.0–34.0)
MCHC: 33.9 g/dL (ref 30.0–36.0)
MCV: 95.9 fL (ref 80.0–100.0)
Platelets: 187 10*3/uL (ref 150–400)
RBC: 3.17 MIL/uL — ABNORMAL LOW (ref 3.87–5.11)
RDW: 13.6 % (ref 11.5–15.5)
WBC: 6.4 10*3/uL (ref 4.0–10.5)
nRBC: 0 % (ref 0.0–0.2)

## 2021-07-28 LAB — HEPARIN LEVEL (UNFRACTIONATED): Heparin Unfractionated: <0.1 IU/mL — ABNORMAL LOW (ref 0.30–0.70)

## 2021-07-28 LAB — APTT: aPTT: 28 seconds (ref 24–36)

## 2021-07-28 SURGERY — IR WITH ANESTHESIA
Anesthesia: General

## 2021-07-28 MED ORDER — NITROGLYCERIN 1 MG/10 ML FOR IR/CATH LAB
INTRA_ARTERIAL | Status: AC
  Filled 2021-07-28: qty 10

## 2021-07-28 MED ORDER — ASPIRIN EC 325 MG PO TBEC: 325.0000 mg | DELAYED_RELEASE_TABLET | ORAL | Status: DC

## 2021-07-28 MED ORDER — CLEVIDIPINE BUTYRATE 0.5 MG/ML IV EMUL
INTRAVENOUS | Status: DC | PRN
  Administered 2021-07-28: 2 mg/h via INTRAVENOUS

## 2021-07-28 MED ORDER — ACETAMINOPHEN 10 MG/ML IV SOLN: 1000.0000 mg | Freq: Once | INTRAVENOUS | Status: DC | PRN

## 2021-07-28 MED ORDER — LIDOCAINE 2% (20 MG/ML) 5 ML SYRINGE
INTRAMUSCULAR | Status: DC | PRN
  Administered 2021-07-28: 60 mg via INTRAVENOUS

## 2021-07-28 MED ORDER — MIDAZOLAM HCL 2 MG/2ML IJ SOLN
INTRAMUSCULAR | Status: DC | PRN
  Administered 2021-07-28: 2 mg via INTRAVENOUS

## 2021-07-28 MED ORDER — SODIUM CHLORIDE 0.9 % IV SOLN: INTRAVENOUS | Status: DC

## 2021-07-28 MED ORDER — FENTANYL CITRATE (PF) 250 MCG/5ML IJ SOLN
INTRAMUSCULAR | Status: DC | PRN
  Administered 2021-07-28 (×2): 50 ug via INTRAVENOUS

## 2021-07-28 MED ORDER — DEXAMETHASONE SODIUM PHOSPHATE 10 MG/ML IJ SOLN
INTRAMUSCULAR | Status: DC | PRN
  Administered 2021-07-28: 8 mg via INTRAVENOUS

## 2021-07-28 MED ORDER — ONDANSETRON HCL 4 MG/2ML IJ SOLN
INTRAMUSCULAR | Status: DC | PRN
  Administered 2021-07-28: 4 mg via INTRAVENOUS

## 2021-07-28 MED ORDER — IOHEXOL 300 MG/ML  SOLN
100.0000 mL | Freq: Once | INTRAMUSCULAR | Status: AC | PRN
  Administered 2021-07-28: 50 mL via INTRA_ARTERIAL

## 2021-07-28 MED ORDER — PROPOFOL 10 MG/ML IV BOLUS
INTRAVENOUS | Status: DC | PRN
  Administered 2021-07-28: 130 mg via INTRAVENOUS
  Administered 2021-07-28: 60 mg via INTRAVENOUS

## 2021-07-28 MED ORDER — ASPIRIN 81 MG PO CHEW
81.0000 mg | CHEWABLE_TABLET | Freq: Every day | ORAL | Status: DC
  Administered 2021-07-29: 81 mg via ORAL
  Filled 2021-07-28: qty 1

## 2021-07-28 MED ORDER — CEFAZOLIN SODIUM-DEXTROSE 2-3 GM-%(50ML) IV SOLR
INTRAVENOUS | Status: DC | PRN
  Administered 2021-07-28: 2 g via INTRAVENOUS

## 2021-07-28 MED ORDER — CHLORHEXIDINE GLUCONATE 0.12 % MT SOLN: 15.0000 mL | Freq: Once | OROMUCOSAL | Status: AC

## 2021-07-28 MED ORDER — ACETAMINOPHEN 325 MG PO TABS
650.0000 mg | ORAL_TABLET | ORAL | Status: DC | PRN
  Administered 2021-07-28: 650 mg via ORAL
  Filled 2021-07-28: qty 2

## 2021-07-28 MED ORDER — AMISULPRIDE (ANTIEMETIC) 5 MG/2ML IV SOLN: 10.0000 mg | Freq: Once | INTRAVENOUS | Status: DC | PRN

## 2021-07-28 MED ORDER — HEPARIN (PORCINE) 25000 UT/250ML-% IV SOLN: 600.0000 [IU]/h | INTRAVENOUS | Status: DC

## 2021-07-28 MED ORDER — ACETAMINOPHEN 160 MG/5ML PO SOLN: 650.0000 mg | ORAL | Status: DC | PRN

## 2021-07-28 MED ORDER — SUGAMMADEX SODIUM 200 MG/2ML IV SOLN
INTRAVENOUS | Status: DC | PRN
  Administered 2021-07-28: 200 mg via INTRAVENOUS

## 2021-07-28 MED ORDER — HEPARIN SODIUM (PORCINE) 1000 UNIT/ML IJ SOLN
INTRAMUSCULAR | Status: DC | PRN
  Administered 2021-07-28: 1000 [IU] via INTRAVENOUS

## 2021-07-28 MED ORDER — CEFAZOLIN SODIUM-DEXTROSE 2-4 GM/100ML-% IV SOLN
INTRAVENOUS | Status: AC
  Filled 2021-07-28: qty 100

## 2021-07-28 MED ORDER — CHLORHEXIDINE GLUCONATE 0.12 % MT SOLN
OROMUCOSAL | Status: AC
  Administered 2021-07-28: 15 mL via OROMUCOSAL
  Filled 2021-07-28: qty 15

## 2021-07-28 MED ORDER — ASPIRIN 81 MG PO CHEW: 81.0000 mg | CHEWABLE_TABLET | Freq: Every day | ORAL | Status: DC

## 2021-07-28 MED ORDER — NIMODIPINE 30 MG PO CAPS: 0.0000 mg | ORAL_CAPSULE | ORAL | Status: DC

## 2021-07-28 MED ORDER — HEPARIN SODIUM (PORCINE) 1000 UNIT/ML IJ SOLN
INTRAMUSCULAR | Status: AC
  Filled 2021-07-28: qty 10

## 2021-07-28 MED ORDER — ORAL CARE MOUTH RINSE: 15.0000 mL | Freq: Once | OROMUCOSAL | Status: AC

## 2021-07-28 MED ORDER — ASPIRIN EC 81 MG PO TBEC
81.0000 mg | DELAYED_RELEASE_TABLET | ORAL | Status: AC
  Administered 2021-07-28: 81 mg via ORAL

## 2021-07-28 MED ORDER — FENTANYL CITRATE (PF) 100 MCG/2ML IJ SOLN: 25.0000 ug | INTRAMUSCULAR | Status: DC | PRN

## 2021-07-28 MED ORDER — NITROGLYCERIN 1 MG/10 ML FOR IR/CATH LAB: INTRA_ARTERIAL | Status: AC | PRN

## 2021-07-28 MED ORDER — VERAPAMIL HCL 2.5 MG/ML IV SOLN
INTRAVENOUS | Status: AC
  Filled 2021-07-28: qty 2

## 2021-07-28 MED ORDER — CLOPIDOGREL BISULFATE 75 MG PO TABS
75.0000 mg | ORAL_TABLET | ORAL | Status: AC
  Administered 2021-07-28: 75 mg via ORAL
  Filled 2021-07-28: qty 1

## 2021-07-28 MED ORDER — IOHEXOL 300 MG/ML  SOLN: 100.0000 mL | Freq: Once | INTRAMUSCULAR | Status: DC | PRN

## 2021-07-28 MED ORDER — EPTIFIBATIDE 20 MG/10ML IV SOLN
INTRAVENOUS | Status: AC | PRN
  Administered 2021-07-28 (×3): 1.5 mg via INTRA_ARTERIAL

## 2021-07-28 MED ORDER — ESMOLOL HCL 100 MG/10ML IV SOLN
INTRAVENOUS | Status: DC | PRN
  Administered 2021-07-28: 20 mg via INTRAVENOUS

## 2021-07-28 MED ORDER — EPTIFIBATIDE 20 MG/10ML IV SOLN
INTRAVENOUS | Status: AC
  Filled 2021-07-28: qty 10

## 2021-07-28 MED ORDER — CLOPIDOGREL BISULFATE 75 MG PO TABS: 75.0000 mg | ORAL_TABLET | Freq: Every day | ORAL | Status: DC

## 2021-07-28 MED ORDER — IOHEXOL 300 MG/ML  SOLN
100.0000 mL | Freq: Once | INTRAMUSCULAR | Status: DC | PRN
Start: 2021-07-28 — End: 2021-07-29

## 2021-07-28 MED ORDER — HEPARIN (PORCINE) 25000 UT/250ML-% IV SOLN
500.0000 [IU]/h | INTRAVENOUS | Status: DC
  Administered 2021-07-28: 500 [IU]/h via INTRAVENOUS

## 2021-07-28 MED ORDER — ASPIRIN EC 81 MG PO TBEC
DELAYED_RELEASE_TABLET | ORAL | Status: AC
Start: 2021-07-28 — End: 2021-07-28
  Filled 2021-07-28: qty 1

## 2021-07-28 MED ORDER — CLOPIDOGREL BISULFATE 75 MG PO TABS
75.0000 mg | ORAL_TABLET | Freq: Every day | ORAL | Status: DC
  Filled 2021-07-28: qty 1

## 2021-07-28 MED ORDER — ROCURONIUM BROMIDE 10 MG/ML (PF) SYRINGE
PREFILLED_SYRINGE | INTRAVENOUS | Status: DC | PRN
  Administered 2021-07-28: 10 mg via INTRAVENOUS
  Administered 2021-07-28: 60 mg via INTRAVENOUS
  Administered 2021-07-28: 10 mg via INTRAVENOUS

## 2021-07-28 MED ORDER — LIDOCAINE HCL 1 % IJ SOLN
INTRAMUSCULAR | Status: AC
  Filled 2021-07-28: qty 20

## 2021-07-28 MED ORDER — HEPARIN (PORCINE) 25000 UT/250ML-% IV SOLN
INTRAVENOUS | Status: AC
  Filled 2021-07-28: qty 250

## 2021-07-28 MED ORDER — CLEVIDIPINE BUTYRATE 0.5 MG/ML IV EMUL
INTRAVENOUS | Status: AC
  Filled 2021-07-28: qty 50

## 2021-07-28 MED ORDER — CLEVIDIPINE BUTYRATE 0.5 MG/ML IV EMUL
0.0000 mg/h | INTRAVENOUS | Status: DC
  Administered 2021-07-28: 2 mg/h via INTRAVENOUS
  Administered 2021-07-28: 5 mg/h via INTRAVENOUS
  Filled 2021-07-28 (×3): qty 50

## 2021-07-28 MED ORDER — PHENYLEPHRINE HCL-NACL 20-0.9 MG/250ML-% IV SOLN
INTRAVENOUS | Status: DC | PRN
  Administered 2021-07-28: 25 ug/min via INTRAVENOUS

## 2021-07-28 MED ORDER — ACETAMINOPHEN 650 MG RE SUPP: 650.0000 mg | RECTAL | Status: DC | PRN

## 2021-07-28 NOTE — Anesthesia Postprocedure Evaluation (Signed)
Anesthesia Post Note ? ?Patient: Dominique RoachShanetta M Overdorf ? ?Procedure(s) Performed: EMBOLIZATION ? ?  ? ?Patient location during evaluation: PACU ?Anesthesia Type: General ?Level of consciousness: awake and alert ?Pain management: pain level controlled ?Vital Signs Assessment: post-procedure vital signs reviewed and stable ?Respiratory status: spontaneous breathing, nonlabored ventilation, respiratory function stable and patient connected to nasal cannula oxygen ?Cardiovascular status: blood pressure returned to baseline and stable ?Postop Assessment: no apparent nausea or vomiting ?Anesthetic complications: no ? ? ?No notable events documented. ? ?Last Vitals:  ?Vitals:  ? 07/28/21 1925 07/28/21 2000  ?BP:    ?Pulse: 68 65  ?Resp: 20 19  ?Temp: 36.7 ?C   ?SpO2: 98% 97%  ?  ?Last Pain:  ?Vitals:  ? 07/28/21 2000  ?TempSrc:   ?PainSc: 0-No pain  ? ? ?  ?  ?  ?  ?  ?  ? ?Nelle DonGregory P Aolanis Crispen ? ? ? ? ?

## 2021-07-28 NOTE — Procedures (Signed)
INR. ?Bilateral common carotid arteriograms, right vertebral arteriogram and left subclavian arteriogram. ?Right radial approach ?Findings.   ?Approximately 5 mm x 3.7 mm anterior communicating artery aneurysm arising at the junction of left anterior cerebral artery A1 A2 junction ?Status post endovascular embolization of the anterior communicating artery aneurysm with a 5 /2 WEB SL device with stasis ?Post CT of the brain demonstrates no hemorrhagic complications. ? ?Patient extubated. ?Denies any headaches or nausea.  Pupils 3 mm equal bilaterally sluggishly reactive. ?No facial asymmetry.  Tongue midline. ?Moves all fours equally proportional to effort. ?Distal right radial pulse present. ? ?Fatima SangerS Cru Kritikos MD ? ?

## 2021-07-28 NOTE — Progress Notes (Signed)
ANTICOAGULATION CONSULT NOTE ? ?Pharmacy Consult for heparin ?Indication:  Post Interventional Neuroradiology Procedure ? ?Heparin Dosing Weight: 55.3 kg ? ?Labs: ?Recent Labs  ?  07/28/21 ?0720  ?HGB 10.3*  ?HCT 30.4*  ?PLT 187  ?APTT 28  ?LABPROT 12.5  ?INR 1.0  ?CREATININE 0.70  ? ? ?Assessment: ?78 yof presenting for L anterior communicating artery aneurysm, s/p intervention 5/1. Pharmacy consulted to dose heparin per Post Interventional Neuroradiology Procedure protocol. Heparin already started in PACU at 500 units/hr. Hg 10.3, plt wnl. No bleed issues reported. Patient is not on anticoagulation PTA. ? ?Goal of Therapy:  ?Heparin level 0.1-0.25 units/ml ?Monitor platelets by anticoagulation protocol: Yes ?  ?Plan:  ?Continue heparin at 500 units/hr (~9 units/kg/hr) ?Check 6hr heparin level ?Monitor AM CBC, s/sx bleeding ?Heparin drip off at 0800 on 5/2 per protocol ? ? ?Leia Alf, PharmD, BCPS ?Clinical Pharmacist ?07/28/2021 4:23 PM ? ?

## 2021-07-28 NOTE — Sedation Documentation (Signed)
Right radial sheath pulled and 10 cc air applied to TR band at 1117, pulse +3 ?

## 2021-07-28 NOTE — H&P (Signed)
? ?Chief Complaint: ?Patient was seen in consultation today for left anterior communicating artery aneurysm ? ?Referring Physician(s): ?[id number] ? ?Supervising Physician: Luanne Bras ? ?Patient Status: [facility] - Out-pt ? ?History of Present Illness: ?Dominique [name] is a 46 y.o. female with a medical history significant for lupus, schizophrenia, neurosyphilis and a headaches. She developed worsening headaches approximately 6 months ago and imaging obtained revealed an intracranial aneurysm.  ? ?CT Angio Head 06/19/21 ?IMPRESSION: ?1. Negative CT head ?2. No intracranial stenosis or atherosclerotic disease ?3. 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 ? ?She met with Dr. Estanislado Pandy 07/08/21 to discuss possible treatment/management options. They discussed the risks and benefits of conservative management verses intervention/embolization. The patient elected to pursue intervention and she understood that this procedure would be done under general anesthesia with overnight observation in the ICU. The patient also was informed that this procedure may not eliminate her headaches. She was instructed to begin taking aspirin and plavix approximately 10 days prior to the procedure.  ?  ?Past Medical History:  ?Diagnosis Date  ? Aneurysm ([place])   ? Arthritis   ? RA  ? Kidney stones   ? Lupus ([place]) 01/17/2021  ? Schizophrenia ([place])   ? Systemic lupus erythematosus ([place])   ? ? ?Past Surgical History:  ?Procedure Laterality Date  ? CESAREAN SECTION    ? HERNIA REPAIR    ? IR RADIOLOGIST EVAL & MGMT  07/10/2021  ? ? ?Allergies: ?Patient has no known allergies. ? ?Medications: ?Prior to Admission medications   ?Medication Sig Start Date End Date Taking? Authorizing Provider  ?cabotegravir ER (APRETUDE) 600 MG/3ML injection Inject 3 mLs (600 mg total) into the muscle every 30 (thirty) days. ?Patient not taking: Reported on 07/22/2021 01/21/21   Esmond Plants, RPH-CPP  ?cabotegravir  ER (APRETUDE) 600 MG/3ML injection Inject 3 mLs (600 mg total) into the muscle every 2 (two) months. 01/21/21   Esmond Plants, RPH-CPP  ?capsaicin (ZOSTRIX) 0.025 % cream Apply 1 application. topically 3 (three) times daily. 07/02/21   [provider]  ?cetirizine (ZYRTEC) 10 MG tablet Take 10 mg by mouth daily. 11/07/20   [provider]  ?clopidogrel (PLAVIX) 75 MG tablet Take 1 tablet (75 mg total) by mouth daily for 14 days. 07/14/21 07/28/21  Docia Barrier, PA  ?DEPO-SUBQ PROVERA 104 104 MG/0.65ML injection every 3 (three) months. 12/20/20   [provider]  ?diclofenac Sodium (VOLTAREN) 1 % GEL Apply 2 g topically 4 (four) times daily. 05/20/20   Benay Pike, MD  ?emtricitabine-tenofovir (TRUVADA) 200-300 MG tablet Take 1 tablet by mouth daily. ?Patient not taking: Reported on 07/21/2021 01/17/21   Tommy Medal, Lavell Islam, MD  ?folic acid (FOLVITE) 1 MG tablet Take 1 tablet (1 mg total) by mouth daily. 06/26/21   Collier Salina, MD  ?gabapentin (NEURONTIN) 600 MG tablet Take 600 mg by mouth 3 (three) times daily. 02/04/21   [provider]  ?hydroxychloroquine (PLAQUENIL) 200 MG tablet TAKE 1 TABLET(200 MG) BY MOUTH DAILY 06/19/21   Rice, Resa Miner, MD  ?meloxicam (MOBIC) 15 MG tablet Take 1 tablet (15 mg total) by mouth daily. 05/20/20   Benay Pike, MD  ?QUEtiapine (SEROQUEL) 100 MG tablet Take 100 mg by mouth daily.    [provider]  ?Vitamin D, Ergocalciferol, (DRISDOL) 1.25 MG (50000 UNIT) CAPS capsule Take 50,000 Units by mouth once a week. 07/07/21   [provider]  ?  ? ?  Family History  ?Problem Relation Age of Onset  ? Stroke Mother   ? Multiple sclerosis Father   ? Hypertension Father   ? Diabetes Father   ? Asthma Brother   ? Dementia Maternal Grandfather   ? ? ?Social History  ? ?Socioeconomic History  ? Marital status: Single  ?  Spouse name: Not on file  ? Number of children: Not on file  ? Years of education: Not on file  ?  Highest education level: Not on file  ?Occupational History  ? Not on file  ?Tobacco Use  ? Smoking status: Former  ?  Packs/day: 0.25  ?  Years: 15.00  ?  Pack years: 3.75  ?  Types: Cigarettes  ?  Quit date: 07/15/2021  ?  Years since quitting: 0.0  ? Smokeless tobacco: Never  ?Vaping Use  ? Vaping Use: Never used  ?Substance and Sexual Activity  ? Alcohol use: Not Currently  ? Drug use: Yes  ?  Types: Marijuana  ?  Comment: Last use was on 07/15/21  ? Sexual activity: Yes  ?  Partners: Male  ?  Birth control/protection: Surgical, Injection  ?Other Topics Concern  ? Not on file  ?Social History Narrative  ? Right handed  ? ?Social Determinants of Health  ? ?Financial Resource Strain: Not on file  ?Food Insecurity: Not on file  ?Transportation Needs: Not on file  ?Physical Activity: Not on file  ?Stress: Not on file  ?Social Connections: Not on file  ? ? ?Review of Systems: A 12 point ROS discussed and pertinent positives are indicated in the HPI above.  All other systems are negative. ? ?Review of Systems  ?Constitutional:  Negative for appetite change and fatigue.  ?Eyes:  Negative for visual disturbance.  ?Respiratory:  Negative for cough and shortness of breath.   ?Cardiovascular:  Negative for chest pain and leg swelling.  ?Gastrointestinal:  Negative for abdominal pain, diarrhea, nausea and vomiting.  ?Neurological:  Negative for dizziness and headaches.  ? ?Vital Signs: ?Temp: 98, BP 125/56, HR 55, RR 18, 100% on room air ? ?Physical Exam ?Constitutional:   ?   General: She is not in acute distress. ?   Appearance: She is not ill-appearing.  ?HENT:  ?   Mouth/Throat:  ?   Mouth: Mucous membranes are moist.  ?   Pharynx: Oropharynx is clear.  ?Eyes:  ?   General: No visual field deficit. ?Cardiovascular:  ?   Rate and Rhythm: Normal rate and regular rhythm.  ?Pulmonary:  ?   Effort: Pulmonary effort is normal.  ?   Breath sounds: Normal breath sounds.  ?Abdominal:  ?   General: Bowel sounds are normal.  ?    Palpations: Abdomen is soft.  ?Musculoskeletal:     ?   General: Normal range of motion.  ?   Right lower leg: No edema.  ?   Left lower leg: No edema.  ?Skin: ?   General: Skin is warm and dry.  ?Neurological:  ?   Mental Status: She is alert and oriented to person, place, and time.  ?   Cranial Nerves: No cranial nerve deficit, dysarthria or facial asymmetry.  ?   Motor: No weakness.  ? ? ?Imaging: ?IR Radiologist Eval & Mgmt ? ?Result Date: 07/10/2021 ?EXAM: NEW PATIENT OFFICE VISIT CHIEF COMPLAINT: Worsening headaches.  Discovery of unruptured brain aneurysm. Current Pain Level: 1-10 HISTORY OF PRESENT ILLNESS: 46 year old right handed lady who has been referred for evaluation  of a recently discovered intracranial aneurysm. The patient is accompanied by her mother. History obtained from the patient, the mother, and also through [facility] records. Patient reports worsening headaches which started approximately 6 months ago. Over the past few weeks, these have increased in intensity, and in duration. She describes these as being nonstop, constant and generalized of a pounding nature. These headaches are a 9/10 associated with dizziness, and blurred vision. Denies any diplopia, blindness, speech, motor or gait difficulties during or after these headaches. These headaches may last an hour to a few hours. They are usually relieved to some degree with Tylenol. She denies any history of nausea, vomiting, or of photophobia. She denies any episodes of confusion, or of a memory difficulty or of seizure-like activity. She reports the headaches tend to be worse during night time occasionally causing her to wake up. Otherwise, she is able to perform her daily routine activities. She denies any chest pain, shortness of breath, or of palpitations. Reports no coughing, wheezing, or hemoptysis. She denies any abdominal pains, difficulty swallowing, constipation or diarrhea. There is a history of rectal bleeding though  none recently. Her weight is steady.  She maintains a reasonable appetite. She denies any recent chills, fever or rigors. Past Medical History: Patient Active Problem List * : Diagnosis * : Date Noted . *

## 2021-07-28 NOTE — H&P (Deleted)
  The note originally documented on this encounter has been moved the the encounter in which it belongs.  

## 2021-07-28 NOTE — Sedation Documentation (Signed)
Hand off with PACU, RN. Right radial site level 0 with 10 cc air in TR band, pulse +3.  ?

## 2021-07-28 NOTE — Plan of Care (Signed)

## 2021-07-28 NOTE — Progress Notes (Signed)
ANTICOAGULATION CONSULT NOTE ? ?Pharmacy Consult for heparin ?Indication:  Post Interventional Neuroradiology Procedure ? ?Heparin Dosing Weight: 55.3 kg ? ?Labs: ?Recent Labs  ?  07/28/21 ?0720 07/28/21 ?1851  ?HGB 10.3*  --   ?HCT 30.4*  --   ?PLT 187  --   ?APTT 28  --   ?LABPROT 12.5  --   ?INR 1.0  --   ?HEPARINUNFRC  --  <0.10*  ?CREATININE 0.70  --   ? ? ? ?Assessment: ?41 yof presenting for L anterior communicating artery aneurysm, s/p intervention 5/1. Pharmacy consulted to dose heparin per Post Interventional Neuroradiology Procedure protocol. Heparin already started in PACU at 500 units/hr. Hg 10.3, plt wnl. No bleed issues reported. Patient is not on anticoagulation PTA. ? ?Heparin level came back undetectable as expected. We will increase slightly and not check another level since it'll be stop at 0800. ? ?Goal of Therapy:  ?Heparin level 0.1-0.25 units/ml ?Monitor platelets by anticoagulation protocol: Yes ?  ?Plan:  ?Increase heparin to 600 units/hr ?Monitor AM CBC, s/sx bleeding ?Heparin drip off at 0800 on 5/2 per protocol ? ? ?Dominique [name], PharmD, BCIDP, AAHIVP, CPP ?Infectious Disease Pharmacist ?07/28/2021 7:27 PM ? ? ? ?

## 2021-07-28 NOTE — Transfer of Care (Signed)
Immediate Anesthesia Transfer of Care Note ? ?Patient: Dominique [name] ? ?Procedure(s) Performed: EMBOLIZATION ? ?Patient Location: PACU ? ?Anesthesia Type:General ? ?Level of Consciousness: awake, alert  and oriented ? ?Airway & Oxygen Therapy: Patient Spontanous Breathing and Patient connected to nasal cannula oxygen ? ?Post-op Assessment: Report given to RN, Post -op Vital signs reviewed and stable and Patient moving all extremities X 4 ? ?Post vital signs: Reviewed and stable ? ?Last Vitals:  ?Vitals Value Taken Time  ?BP 130/73 07/28/21 1145  ?Temp    ?Pulse 72 07/28/21 1149  ?Resp 17 07/28/21 1149  ?SpO2 100 % 07/28/21 1149  ?Vitals shown include unvalidated device data. ? ?Last Pain:  ?Vitals:  ? 07/28/21 0738  ?TempSrc:   ?PainSc: 0-No pain  ?   ? ?  ? ?Complications: No notable events documented. ?

## 2021-07-28 NOTE — Anesthesia Procedure Notes (Signed)
Arterial Line Insertion ?Start/End5/03/2021 8:28 AM, 07/28/2021 8:35 AM ?Performed by: Atilano MedianStoltzfus, Samauri Kellenberger P, DO, anesthesiologist ? Patient location: Pre-op. ?Preanesthetic checklist: patient identified, IV checked, site marked, risks and benefits discussed, surgical consent, monitors and equipment checked, pre-op evaluation, timeout performed and anesthesia consent ?Lidocaine 1% used for infiltration ?Left, radial was placed ?Catheter size: 20 G ?Hand hygiene performed , maximum sterile barriers used  and Seldinger technique used ?Allen's test indicative of satisfactory collateral circulation ?Attempts: 2 ?Procedure performed using ultrasound guided technique. ?Ultrasound Notes:anatomy identified, needle tip was noted to be adjacent to the nerve/plexus identified and no ultrasound evidence of intravascular and/or intraneural injection ?Following insertion, dressing applied and Biopatch. ?Post procedure assessment: normal and unchanged ? ?Post procedure complications: local hematoma, second provider assisted and unsuccessful attempts. ?Patient tolerated the procedure well with no immediate complications. ?Additional procedure comments: - restricted to LUE per surgeon. Multiple attempts by CRNA team unsuccessful. Initial attempt by self US guided unable to thread catheter. Second attempt with US successful. . ? ? ? ?

## 2021-07-28 NOTE — Progress Notes (Signed)
Patient seen with Dr. Corliss Skains in PACU.  ? ?Pt sitting in bed, NAD.  ?Denies headache, vision change, was able to eat some food.  ? ?Alert, awake, and oriented x3 ?Speech and comprehension intact ?PERRL  ?EOMs intact nystagmus or subjective diplopia. ?No facial asymmetry. ?Tongue midline  ?Motor power intact all 4  ?No pronator drift. ?Fine motor and coordination intact ?Right RA 2 + ? ?PLAN ?- Remove Foley  ?- Advance diet as tolerated  ?- Pt will stay overnight at NICU  ?- Plan to d/c tomorrow if stable ? ?Please call NIR for questions and concerns.  ? ?Willette Brace PA-C ?07/28/2021 3:44 PM ? ? ? ? ? ?

## 2021-07-28 NOTE — Anesthesia Procedure Notes (Signed)
Procedure Name: Intubation ?Date/Time: 07/28/2021 9:01 AM ?Performed by: Marny Lowenstein, CRNA ?Pre-anesthesia Checklist: Patient identified, Emergency Drugs available, Suction available and Patient being monitored ?Patient Re-evaluated:Patient Re-evaluated prior to induction ?Oxygen Delivery Method: Circle System Utilized ?Preoxygenation: Pre-oxygenation with 100% oxygen ?Induction Type: IV induction ?Ventilation: Mask ventilation without difficulty ?Laryngoscope Size: Glidescope and 3 ?Grade View: Grade II ?Tube type: Oral ?Tube size: 7.0 mm ?Number of attempts: 1 ?Airway Equipment and Method: Oral airway, Video-laryngoscopy and Rigid stylet ?Placement Confirmation: ETT inserted through vocal cords under direct vision, positive ETCO2 and breath sounds checked- equal and bilateral ?Secured at: 21 cm ?Tube secured with: Tape ?Dental Injury: Teeth and Oropharynx as per pre-operative assessment  ?Difficulty Due To: Difficult Airway- due to dentition and Difficult Airway- due to limited oral opening ?Comments: Due to pt's small mouth opening, glidescope was used from beginning.  DL with 3 GoPro, difficulty passing tube without rigid stylet. 2nd DL with 3 GoPro, rigid stylet obtained, ETT passed without difficulty or trauma. Grade 1 view.  ? ? ? ? ?

## 2021-07-29 ENCOUNTER — Telehealth: Payer: Self-pay

## 2021-07-29 ENCOUNTER — Encounter (HOSPITAL_COMMUNITY): Payer: Self-pay | Admitting: Interventional Radiology

## 2021-07-29 DIAGNOSIS — M359 Systemic involvement of connective tissue, unspecified: Secondary | ICD-10-CM

## 2021-07-29 DIAGNOSIS — R768 Other specified abnormal immunological findings in serum: Secondary | ICD-10-CM

## 2021-07-29 DIAGNOSIS — G8929 Other chronic pain: Secondary | ICD-10-CM

## 2021-07-29 LAB — CBC
HCT: 28 % — ABNORMAL LOW (ref 36.0–46.0)
Hemoglobin: 9.3 g/dL — ABNORMAL LOW (ref 12.0–15.0)
MCH: 31.6 pg (ref 26.0–34.0)
MCHC: 33.2 g/dL (ref 30.0–36.0)
MCV: 95.2 fL (ref 80.0–100.0)
Platelets: 178 10*3/uL (ref 150–400)
RBC: 2.94 MIL/uL — ABNORMAL LOW (ref 3.87–5.11)
RDW: 13.3 % (ref 11.5–15.5)
WBC: 8.7 10*3/uL (ref 4.0–10.5)
nRBC: 0 % (ref 0.0–0.2)

## 2021-07-29 LAB — PLATELET INHIBITION P2Y12

## 2021-07-29 MED ORDER — GABAPENTIN 600 MG PO TABS
600.0000 mg | ORAL_TABLET | Freq: Three times a day (TID) | ORAL | Status: DC
  Administered 2021-07-29: 600 mg via ORAL
  Filled 2021-07-29: qty 1

## 2021-07-29 MED ORDER — QUETIAPINE FUMARATE 100 MG PO TABS
100.0000 mg | ORAL_TABLET | Freq: Every day | ORAL | Status: DC
Start: 2021-07-29 — End: 2021-07-29

## 2021-07-29 MED ORDER — DICLOFENAC SODIUM 1 % EX GEL
2.0000 g | Freq: Four times a day (QID) | CUTANEOUS | Status: DC
  Filled 2021-07-29: qty 100

## 2021-07-29 MED ORDER — MELOXICAM 7.5 MG PO TABS
15.0000 mg | ORAL_TABLET | Freq: Every day | ORAL | Status: DC
  Administered 2021-07-29: 15 mg via ORAL
  Filled 2021-07-29: qty 2

## 2021-07-29 MED ORDER — CLOPIDOGREL BISULFATE 75 MG PO TABS: 75.0000 mg | ORAL_TABLET | Freq: Every day | ORAL | 0 refills | Status: AC

## 2021-07-29 MED ORDER — CAPSAICIN 0.025 % EX CREA
1.0000 "application " | TOPICAL_CREAM | Freq: Three times a day (TID) | CUTANEOUS | Status: DC
  Filled 2021-07-29: qty 60

## 2021-07-29 MED ORDER — CLOPIDOGREL BISULFATE 75 MG PO TABS
75.0000 mg | ORAL_TABLET | Freq: Every day | ORAL | Status: DC
Start: 2021-07-29 — End: 2021-07-29
  Administered 2021-07-29: 75 mg via ORAL

## 2021-07-29 MED ORDER — VITAMIN D (ERGOCALCIFEROL) 1.25 MG (50000 UNIT) PO CAPS: 50000.0000 [IU] | ORAL_CAPSULE | ORAL | Status: DC

## 2021-07-29 MED ORDER — FOLIC ACID 1 MG PO TABS
1.0000 mg | ORAL_TABLET | Freq: Every day | ORAL | Status: DC
Start: 2021-07-29 — End: 2021-07-29
  Administered 2021-07-29: 1 mg via ORAL
  Filled 2021-07-29: qty 1

## 2021-07-29 MED ORDER — LORATADINE 10 MG PO TABS
10.0000 mg | ORAL_TABLET | Freq: Every day | ORAL | Status: DC
  Administered 2021-07-29: 10 mg via ORAL
  Filled 2021-07-29: qty 1

## 2021-07-29 MED ORDER — HYDROXYCHLOROQUINE SULFATE 200 MG PO TABS
200.0000 mg | ORAL_TABLET | Freq: Every day | ORAL | Status: DC
  Administered 2021-07-29: 200 mg via ORAL
  Filled 2021-07-29: qty 1

## 2021-07-29 NOTE — Discharge Summary (Signed)
? ?Patient ID: ?[place] ?MRN: [id number] ?DOB/AGE: November 29, 1975 46 y.o. ? ?Admit date: 07/28/2021 ?Discharge date: 07/29/2021 ? ?Supervising Physician: Luanne Bras ? ?Patient Status: [facility] - In-pt ? ?Admission Diagnoses: Left Anterior communication artery aneurysm ? ?Discharge Diagnoses:  ?Principal Problem: ?  Brain aneurysm ? ? ?Discharged Condition: good ? ?Hospital Course: Left anterior communicating artery embolization in IR 07/28/21 with Dr Estanislado Pandy ?Procedure went well without complication. ?Overnight observation without issue or complaints ?Denies headache at this time-- Tylenol last pm has relieved mild headache. ?Denies N/V ?Denies vision or speech changes ?Denies numbness or tingling ?Eating well ?Urinating well ?Passing gas ?She has been seen  and evaluated with Dr Estanislado Pandy ?Neurologically intact ?Plan for DC this am ? ?Consults: None ? ?Significant Diagnostic Studies: Bilateral common carotid arteriograms, right vertebral arteriogram and left subclavian arteriogram. ?Right radial approach ? ?Treatments: Approximately 5 mm x 3.7 mm anterior communicating artery aneurysm arising at the junction of left anterior cerebral artery A1 A2 junction ?Status post endovascular embolization of the anterior communicating artery aneurysm with a 5 /2 WEB SL device with stasis ?Post CT of the brain demonstrates no hemorrhagic complications. ? ?Discharge Exam: ?Blood pressure 115/80, pulse 60, temperature 98.3 ?F (36.8 ?C), temperature source Oral, resp. rate 19, height 5' (1.524 m), weight 122 lb (55.3 kg), SpO2 100 %. ? ?A/O ?Appropriate ?Pleasant ?Tongue midline ?EOM ?Face symmetrical ?Smile = ?Heart: RRR ?Lungs CTA ?Abd soft; no masses; NT ?Ext FROM ?Good strength and sensation = B ?Rt wrist site: NT no bleeding; no hematoma ?UOP great; yellow ?.. ?Results for orders placed or performed during the hospital encounter of 07/28/21  ?Platelet inhibition p2y12 (not at [facility])  ?Result Value Ref Range  ? Platelet  Function  P2Y12 See Scanned report in  Link 182 - 335 PRU  ?APTT  ?Result Value Ref Range  ? aPTT 28 24 - 36 seconds  ?Basic metabolic panel  ?Result Value Ref Range  ? Sodium 139 135 - 145 mmol/L  ? Potassium 3.4 (L) 3.5 - 5.1 mmol/L  ? Chloride 110 98 - 111 mmol/L  ? CO2 22 22 - 32 mmol/L  ? Glucose, Bld 104 (H) 70 - 99 mg/dL  ? BUN 5 (L) 6 - 20 mg/dL  ? Creatinine, Ser 0.70 0.44 - 1.00 mg/dL  ? Calcium 8.8 (L) 8.9 - 10.3 mg/dL  ? GFR, Estimated >60 >60 mL/min  ? Anion gap 7 5 - 15  ?Protime-INR  ?Result Value Ref Range  ? Prothrombin Time 12.5 11.4 - 15.2 seconds  ? INR 1.0 0.8 - 1.2  ?CBC  ?Result Value Ref Range  ? WBC 6.4 4.0 - 10.5 K/uL  ? RBC 3.17 (L) 3.87 - 5.11 MIL/uL  ? Hemoglobin 10.3 (L) 12.0 - 15.0 g/dL  ? HCT 30.4 (L) 36.0 - 46.0 %  ? MCV 95.9 80.0 - 100.0 fL  ? MCH 32.5 26.0 - 34.0 pg  ? MCHC 33.9 30.0 - 36.0 g/dL  ? RDW 13.6 11.5 - 15.5 %  ? Platelets 187 150 - 400 K/uL  ? nRBC 0.0 0.0 - 0.2 %  ?Heparin level (unfractionated)  ?Result Value Ref Range  ? Heparin Unfractionated <0.10 (L) 0.30 - 0.70 IU/mL  ?CBC  ?Result Value Ref Range  ? WBC 8.7 4.0 - 10.5 K/uL  ? RBC 2.94 (L) 3.87 - 5.11 MIL/uL  ? Hemoglobin 9.3 (L) 12.0 - 15.0 g/dL  ? HCT 28.0 (L) 36.0 - 46.0 %  ? MCV 95.2 80.0 - 100.0  fL  ? MCH 31.6 26.0 - 34.0 pg  ? MCHC 33.2 30.0 - 36.0 g/dL  ? RDW 13.3 11.5 - 15.5 %  ? Platelets 178 150 - 400 K/uL  ? nRBC 0.0 0.0 - 0.2 %  ?Pregnancy, urine POC  ?Result Value Ref Range  ? Preg Test, Ur NEGATIVE NEGATIVE  ?  ? ? ? ? ?Disposition:  ?ACOM aneurysm embolization- Dr Estanislado Pandy in IR 07/28/21 ?Doing well after 24 hr observation ?Planned for DC this am ?Dr Estanislado Pandy has seen and evaluated pt ?Plan: Continue ASA 81 mg and Plavic 75 mg daily ?(Rx Plavix sent to [facility] in pt chart) ?Continue all home meds ?2 week follow up with Dr Jaclyn Prime will hear from scheduler for time and date ?All questions answered for pt in room ?She has good understanding of plan ? ? ?Discharge  Instructions   ? ? Call MD for:  difficulty breathing, headache or visual disturbances   Complete by: As directed ?  ? Call MD for:  extreme fatigue   Complete by: As directed ?  ? Call MD for:  hives   Complete by: As directed ?  ? Call MD for:  persistant dizziness or light-headedness   Complete by: As directed ?  ? Call MD for:  persistant nausea and vomiting   Complete by: As directed ?  ? Call MD for:  redness, tenderness, or signs of infection (pain, swelling, redness, odor or green/yellow discharge around incision site)   Complete by: As directed ?  ? Call MD for:  severe uncontrolled pain   Complete by: As directed ?  ? Call MD for:  temperature >100.4   Complete by: As directed ?  ? Diet - low sodium heart healthy   Complete by: As directed ?  ? Discharge instructions   Complete by: As directed ?  ? Continue all home meds; continue ASA 81 mg daily and Plavix 75 mg daily--- pt to hear from scheduler for 2 week appt time and date--- call [phone] if any questions or concerns  ? Discharge wound care:   Complete by: As directed ?  ? Keep new bandaid on Rt wrist site daily x 1 week  ? Driving Restrictions   Complete by: As directed ?  ? No driving 1 week  ? Increase activity slowly   Complete by: As directed ?  ? Lifting restrictions   Complete by: As directed ?  ? No lifting over 10 lbs x 2 weeks; no stooping or bending 2 weeks  ? ?  ? ?Allergies as of 07/29/2021   ?No Known Allergies ?  ? ?  ?Medication List  ?  ? ?TAKE these medications   ? ?capsaicin 0.025 % cream ?Commonly known as: ZOSTRIX ?Apply 1 application. topically 3 (three) times daily. ?  ?cetirizine 10 MG tablet ?Commonly known as: ZYRTEC ?Take 10 mg by mouth daily. ?  ?clopidogrel 75 MG tablet ?Commonly known as: Plavix ?Take 1 tablet (75 mg total) by mouth daily for 14 days. ?  ?Depo-SubQ Provera 104 104 MG/0.65ML injection ?Generic drug: medroxyPROGESTERone ?every 3 (three) months. ?  ?diclofenac Sodium 1 % Gel ?Commonly known as:  Voltaren ?Apply 2 g topically 4 (four) times daily. ?  ?folic acid 1 MG tablet ?Commonly known as: FOLVITE ?Take 1 tablet (1 mg total) by mouth daily. ?  ?gabapentin 600 MG tablet ?Commonly known as: NEURONTIN ?Take 600 mg by mouth 3 (three) times daily. ?  ?hydroxychloroquine 200 MG tablet ?Commonly  known as: PLAQUENIL ?TAKE 1 TABLET(200 MG) BY MOUTH DAILY ?  ?meloxicam 15 MG tablet ?Commonly known as: MOBIC ?Take 1 tablet (15 mg total) by mouth daily. ?  ?QUEtiapine 100 MG tablet ?Commonly known as: SEROQUEL ?Take 100 mg by mouth daily. ?  ?Vitamin D (Ergocalciferol) 1.25 MG (50000 UNIT) Caps capsule ?Commonly known as: DRISDOL ?Take 50,000 Units by mouth once a week. ?  ? ?  ? ?  ?  ? ? ?  ?Discharge Care Instructions  ?(From admission, onward)  ?  ? ? ?  ? ?  Start     Ordered  ? 07/29/21 0000  Discharge wound care:       ?Comments: Keep new bandaid on Rt wrist site daily x 1 week  ? 07/29/21 0906  ? ?  ?  ? ?  ? ? Follow-up Information   ? ? Luanne Bras, MD Follow up in 2 week(s).   ?Specialties: Interventional Radiology, Radiology ?Why: 2 week follow up with Dr Estanislado Pandy--- scheduler will call pt for time and date- call [phone] if any questions; Rx for Plavix sent to [facility] in pts chart ?Contact information: ?[address] ?[address] ?[place] [address] ?[phone] ? ? ?  ?  ? ?  ?  ? ?  ?  ? ?Electronically Signed: ?Lavonia Drafts, PA-C ?07/29/2021, 9:11 AM ? ? ?I have spent Greater Than 30 Minutes discharging Donnalee Curry. ? ? ?  ? ?

## 2021-07-29 NOTE — Telephone Encounter (Signed)
Patient called stating Dr. Dimple Casey wanted her to call the office once she had her procedure so she could start the medication that he recommended (doesn't remember the name, but says its starts with an S).  Patient states she was discharged from the hospital today, 07/29/21.   ?

## 2021-07-30 LAB — POCT ACTIVATED CLOTTING TIME: Activated Clotting Time: 185 seconds

## 2021-07-31 MED ORDER — SULFASALAZINE 500 MG PO TABS: ORAL_TABLET | ORAL | 1 refills | Status: DC

## 2021-07-31 NOTE — Telephone Encounter (Signed)
Ms. Zachery DauerBarnes needs to schedule a follow up appointment in about 5-6 weeks ? ?FYI- Discussed medication was sulfasalazine with plan to start 500 mg daily and titrate up to 1000 mg BID, going up 1 tablet per day per week (1 AM 0 PM, 1 BID, 2 AM 1 PM, 2 BID)

## 2021-08-01 ENCOUNTER — Other Ambulatory Visit: Payer: Self-pay | Admitting: Internal Medicine

## 2021-08-01 DIAGNOSIS — M359 Systemic involvement of connective tissue, unspecified: Secondary | ICD-10-CM

## 2021-08-13 ENCOUNTER — Ambulatory Visit (HOSPITAL_COMMUNITY)
Admission: RE | Admit: 2021-08-13 | Discharge: 2021-08-13 | Disposition: A | Discharge status: Home / Self Care | Payer: Medicaid Other | Source: Ambulatory Visit | Attending: Radiology | Admitting: Radiology

## 2021-08-13 ENCOUNTER — Other Ambulatory Visit: Payer: Self-pay | Admitting: Student

## 2021-08-13 DIAGNOSIS — I671 Cerebral aneurysm, nonruptured: Secondary | ICD-10-CM

## 2021-08-13 MED ORDER — CLOPIDOGREL BISULFATE 75 MG PO TABS: 37.5000 mg | ORAL_TABLET | Freq: Every day | ORAL | 0 refills | Status: DC

## 2021-08-15 ENCOUNTER — Other Ambulatory Visit: Payer: Self-pay | Admitting: Pharmacist

## 2021-08-15 ENCOUNTER — Other Ambulatory Visit (HOSPITAL_COMMUNITY): Payer: Self-pay

## 2021-08-15 DIAGNOSIS — Z79899 Other long term (current) drug therapy: Secondary | ICD-10-CM

## 2021-08-15 HISTORY — PX: IR RADIOLOGIST EVAL & MGMT: IMG5224

## 2021-08-15 MED ORDER — APRETUDE 600 MG/3ML IM SUER
600.0000 mg | INTRAMUSCULAR | 5 refills | Status: DC
  Filled 2021-08-15 – 2021-08-18 (×2): qty 3, 60d supply, fill #0
  Filled 2021-10-13: qty 3, 60d supply, fill #1
  Filled 2021-12-19: qty 3, 60d supply, fill #2
  Filled 2022-02-10: qty 3, 60d supply, fill #3
  Filled 2022-04-03: qty 3, 60d supply, fill #4
  Filled 2022-06-12: qty 3, 60d supply, fill #5

## 2021-08-18 ENCOUNTER — Other Ambulatory Visit: Payer: Self-pay | Admitting: Pharmacist

## 2021-08-18 ENCOUNTER — Other Ambulatory Visit (HOSPITAL_COMMUNITY): Payer: Self-pay

## 2021-08-18 DIAGNOSIS — Z79899 Other long term (current) drug therapy: Secondary | ICD-10-CM

## 2021-08-19 ENCOUNTER — Telehealth: Payer: Self-pay

## 2021-08-19 NOTE — Telephone Encounter (Signed)
[facility] Patient Advocate Encounter  Patient's medication (Apretude) have been couriered to [facility] from [facility] and will be administered on the patient next office visit on 08/28/21.  Clearance Coots , CPhT Specialty Pharmacy Patient [facility] for Infectious Disease Phone: [phone] Fax:  [phone]

## 2021-08-27 NOTE — Progress Notes (Deleted)
Office Visit Note  Dominique WARBURTONtta M Christensen             Date of Birth: [birth year]           MRN: [id number]             PCP: Dot Been, FNP Referring: Dot Been, FNP Visit Date: 09/08/2021   Subjective:  No chief complaint on file.   History of Present Illness: Dominique [name] is a 46 y.o. 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  Previous HPI 07/21/2021 Dominique [name] is a 46 y.o. 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 So far she still has about the same amount of pain worst affected areas in both heels and in mid feet. Plantar fasciitis exercises mildly helpful for the bottom of the feet but not much. 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    Previous HPI 05/20/21 Dominique [name] is a 46 y.o. 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 So far her symptoms are about the same. She continues having pain worst in her feet and less extent along medial side of knees. She does not see much swelling or discoloration just pain. Has not noticed any side effects of the methotrexate.   Previous HPI 01/23/21 Dominique [name] is a 46 y.o. female here for evaluation of positive ANA and elevated sedimentation rate with multiple systemic symptoms including weight loss, lymphadenopathy, paresthesias, joint pain, and headaches.  She started noticing hyperpigmented skin rashes developing on her face and extremities since several months ago does not recall specific onset or provoking episode.  Then developed worsening joint pain in multiple sites but started around July of this year.  This involved pain affecting both hands also the hips knees and feet bilaterally.  She described morning stiffness lasting 2 or 3 hours in duration also especially pain in the bottoms  of the feet first and getting out of bed.  After a month with these ongoing and somewhat worsening symptoms she was seen in primary care clinic. Workup at that time positive for trichomoniasis and syphilis and starting antibiotics also found to have high sedimentation rate with positive ANA and CCP Abs. She followed up for continued treatment last month and more recently in ID clinic with completion of antibiotic treatment for syphilis with Bicillin.  Neurology evaluation with lumbar puncture that was not suggestive for active neurosyphilis. She saw ophthalmology for visual change symptoms reports findings were not concerning for infectious or inflammatory type changes.   No Rheumatology ROS completed.   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   Right hip pain 05/16/2020   BRBPR (bright red blood per rectum) 10/18/2019   Tobacco use disorder 07/31/2019   Dyspnea, paroxysmal nocturnal 07/31/2019   Schizophrenia (HCC) 07/31/2019   Encounter for screening examination for  sexually transmitted disease 07/31/2019   Abnormal uterine bleeding (AUB) 05/09/2013   BV (bacterial vaginosis) 05/09/2013   Dysmenorrhea 05/09/2013    Past Medical History:  Diagnosis Date   Aneurysm (HCC)    Arthritis    RA   Kidney stones    Lupus (HCC) 01/17/2021   Schizophrenia (HCC)    Systemic lupus erythematosus (HCC)     Family History  Problem Relation Age of Onset   Stroke Mother    Multiple sclerosis Father    Hypertension Father    Diabetes Father    Asthma Brother    Dementia Maternal  Grandfather    Past Surgical History:  Procedure Laterality Date   CESAREAN SECTION     HERNIA REPAIR     IR 3D INDEPENDENT WKST  07/28/2021   IR ANGIO INTRA EXTRACRAN SEL INTERNAL CAROTID BILAT MOD SED  07/28/2021   IR ANGIO VERTEBRAL SEL VERTEBRAL UNI L MOD SED  07/28/2021   IR ANGIOGRAM FOLLOW UP STUDY  07/28/2021   IR CT HEAD LTD  07/28/2021   IR NEURO EACH ADD'L AFTER BASIC UNI LEFT (MS)  07/28/2021   IR RADIOLOGIST EVAL & MGMT  07/10/2021   IR RADIOLOGIST EVAL & MGMT  08/15/2021   IR TRANSCATH/EMBOLIZ  07/28/2021   IR US GUIDE VASC ACCESS RIGHT  07/28/2021   RADIOLOGY WITH ANESTHESIA N/A 07/28/2021   Procedure: EMBOLIZATION;  Surgeon: Julieanne Cottoneveshwar, Sanjeev, MD;  Location: [facility] OR;  Service: Radiology;  Laterality: N/A;   Social History   Social History Narrative   Right handed   Immunization History  Administered Date(s) Administered   Hepb-cpg 06/25/2021   Influenza,inj,Quad PF,6+ Mos 01/30/2021     Objective: Vital Signs: There were no vitals taken for this visit.   Physical Exam   Musculoskeletal Exam: ***  CDAI Exam: CDAI Score: -- Patient Global: --; Provider Global: -- Swollen: --; Tender: -- Joint Exam 09/08/2021   No joint exam has been documented for this visit   There is currently no information documented on the homunculus. Go to the Rheumatology activity and complete the homunculus joint exam.  Investigation: No additional findings.  Imaging: IR Transcath/Emboliz  Result Date: 07/30/2021 CLINICAL DATA:  History of worsening headaches. Strong family history of ruptured and unruptured brain aneurysms. EXAM: TRANSCATHETER THERAPY EMBOLIZATION COMPARISON:  MRA brain of 05/21/2021 and CT angiogram of the head and neck of 06/19/2021. MEDICATIONS: Heparin 3,000 units IV. Ancef 2 g IV antibiotic was administered within 1 hour of the procedure. ANESTHESIA/SEDATION: General anesthesia. CONTRAST:  Omnipaque 300 approximately 110 mL. FLUOROSCOPY TIME:  Fluoroscopy Time: 26 minutes 6  seconds (2338 mGy). COMPLICATIONS: None immediate. TECHNIQUE: Informed written consent was obtained from the patient after a thorough discussion of the procedural risks, benefits and alternatives. All questions were addressed. Maximal Sterile Barrier Technique was utilized including caps, mask, sterile gowns, sterile gloves, sterile drape, hand hygiene and skin antiseptic. A timeout was performed prior to the initiation of the procedure. The right forearm to the wrist was prepped and draped in the usual sterile manner. The radial artery morphology was then identified with ultrasound and document in the PACS system. A dorsal palmar anastomosis was verified to be present. Using ultrasound guidance, access into the right radial artery was obtained over a 0.018 inch micro guidewire. A 6/7 radial sheath was then inserted without event. The micro guidewire, and obturator were removed. Good aspiration obtained from the side port of the radial sheath. A cocktail of 2.5 mg of verapamil, 2000 units  of heparin, and 200 mcg of nitroglycerin was then infused in diluted form without event. A right radial arteriogram was then performed. A 0.035 inch Roadrunner guidewire and a Simmons 2 diagnostic catheter were then advanced to the aortic arch region and selectively positioned in the right vertebral artery, the right common carotid artery, the left common carotid artery and the left subclavian artery. Also performed was a 3D rotational arteriogram of the left anterior circulation via the left common carotid artery injection. 3D reformatted images were obtained on a separate workstation. FINDINGS: Right vertebral artery origin demonstrates wide patency. Vessel is seen to asend to the cranial skull base. Mild to moderate tortuosity is evident in the mid segment of the right vertebral artery. Patency is maintained of the vertebrobasilar junction and the right posterior-inferior cerebellar artery. The basilar artery, the posterior  cerebral arteries, the superior cerebellar arteries and the anterior-inferior cerebellar arteries opacify into the capillary and venous phases. The left subclavian arteriogram demonstrates a normal thyrocervical trunk with its branches. No left lower vertebral artery is identified on this injection. The right common carotid arteriogram demonstrates the right external carotid artery and its major branches to be widely patent. The right internal carotid artery at the bulb to the cranial skull base demonstrates wide patency with a double U shaped configuration of the mid cervical right ICA associated with small focal areas of outpouching suggestive of fibromuscular dysplastic changes. More distally, the petrous, the cavernous and the supraclinoid segments demonstrate wide patency. The right middle cerebral artery and the right anterior cerebral artery opacify into the capillary and venous phases. Arteriogram with a left carotid compression demonstrates no opacification. The left common carotid arteriogram demonstrates the left external carotid artery and its major branches to be widely patent. The left internal carotid artery at the bulb to the cranial skull base is widely patent with a U-shaped configuration of the mid left ICA segment associated with mild focal areas of smooth outpouching without evidence of stenosis. 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 The petrous, the cavernous and the supraclinoid segments demonstrate wide patency. The left middle cerebral artery opacifies into the capillary and venous phases. The left anterior cerebral artery opacifies into the capillary and venous phases. Arising at the junction of the left anterior cerebral artery A1 A2 junction is a saccular aneurysm projecting horizontally to the right. The 3D rotational images confirm the presence of an approximately 5.1 mm x approximately 3.4 mm saccular aneurysm. Also demonstrated is transient  opacification via the anterior communicating artery of the right anterior cerebral artery A2 segment. ENDOVASCULAR EMBOLIZATION OF THE ANTERIOR COMMUNICATING ARTERY ANEURYSM USING THE WEB 5/2 INTRA SACCULAR FLOW DISRUPTER DEVICE The diagnostic JB 2 catheter in the distal left common carotid artery was then exchanged over a 0.035 inch 300 cm Rosen exchange guidewire for a 95 cm RIST sheath, which was advanced into the proximal left internal carotid artery. The guidewire was removed. A gentle arteriogram through the sheath demonstrated no evidence of spasm, dissection or of intraluminal filling defects. Over a 0.014 inch Aristotle micro guidewire with a moderate J configuration a combination of an 017 150 cm Via microcatheter inside of a 124 cm 5 Chad guide catheter was advanced to the distal end of the RIST sheath. Using a torque device, access was obtained passed the tortuosity of the mid cervical left ICA and into the horizontal petrous segment followed by the microcatheter and the [facility] guide catheter. Using a torque device, the micro guidewire with  the microcatheter were then gently advanced into the supraclinoid left ICA. Access was obtained into the left A1 segment followed by the microcatheter. The micro guidewire was then advanced into the mid 1/3 of the aneurysm followed by the microcatheter. The micro guidewire was then retrieved from the aneurysm ensuring no sudden forward motion movement of the microcatheter. None was observed. Free aspiration of blood was obtained from the hub of the intra-aneurysmal microcatheter, which was connected to continuous heparinized saline infusion. Having considered the measurements, it was decided to proceed with placement of a WEB 5/2 SL device. Device was then retrieved from the housing and cleared of any air while submerged in heparinized saline infusion. Under biplane intermittent fluoroscopy, the device was then advanced to the distal end of the intra aneurysmal  microcatheter, where its distal marker was aligned with the distal marker on the microcatheter. Manipulation was then performed under biplane fluoroscopy until complete expansion of the device was evident by manipulation of the device and the microcatheter. A control arteriogram performed through the 5 Chad guide catheter demonstrated excellent apposition of the device in the AP and lateral projections with stasis evident. Patency of the left anterior cerebral artery A1 A2 junction was evident. The proximal marker was seen to jut just proximal to the neck of the aneurysm. With the device held stable the delivery microcatheter was gently retrieved. The device was then detached in the usual manner witnessing movement of the proximal portion of the device. With the partially deployed device still held stable, the microcatheter was advanced to the proximal marker on the device and detachment was complete by withdrawing the micro guidewire. The microcatheter was then gently retrieved and removed. Control arteriograms performed at 15 and 25 minutes post deployment of the device continued to demonstrate excellent apposition with early stasis within the aneurysm. No evidence of intraluminal filling defects, or of occlusion was seen in the left ACA, MCA or posterior communicating artery regions. Also given was approximately 4.5 mg of Integrilin intra-arterially to ensure no aggregation of platelets at the neck of the device. None was observed. The combination of the 5 Chad guide catheter, and the RIST sheath was then retrieved and removed. Hemostasis was obtained at the right wrist with a wrist band. Distal right radial pulse was verified to be present. A flat panel CT of the brain demonstrated no evidence of hemorrhagic complications. The patient was then extubated. Upon recovery, the patient reports no headaches, nausea, vomiting or visual symptoms. Her speech was normal. She was able to move all fours  equally. She was then transferred to the PACU and then MICU. Overnight she had a mild headache which responded to Tylenol but without any neurological symptoms. She was able to tolerate liquids and semi solids early in the evening and later the following morning. The IV heparin was stopped and the patient switched to aspirin 81 mg a day, and Plavix 75 mg a day. She was then ambulated with assistance and then without. She was then discharged home under the care of her mother. Patient specifically advised to maintain adequate hydration and to avoid smoking. She was asked to refrain from driving for at least 1-76 days. She was also advised to refrain from stooping, bending or lifting weights above 10 pounds for at least 2 weeks. She was advised to restart her preprocedural medications. Patient expressed understanding and agreement with the above management plan. 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 PLAN: Patient advised to continue with Plavix 75 mg a day, and aspirin 81 mg a day for 2 weeks until she comes back for follow-up. Consider weaning off the Plavix at that time and maintaining her on 325 mg of aspirin. Return for follow-up in 2 weeks in the clinic. Electronically Signed   By: Julieanne Cotton M.D.   On: 07/30/2021 08:02   IR Angiogram Follow Up Study  Result Date: 07/30/2021 CLINICAL DATA:  History of worsening headaches. Strong family history of ruptured and unruptured brain aneurysms. EXAM: TRANSCATHETER THERAPY EMBOLIZATION COMPARISON:  MRA brain of 05/21/2021 and CT angiogram of the head and neck of 06/19/2021. MEDICATIONS: Heparin 3,000 units IV. Ancef 2 g IV antibiotic was administered within 1 hour of the procedure. ANESTHESIA/SEDATION: General anesthesia. CONTRAST:  Omnipaque 300 approximately 110 mL. FLUOROSCOPY TIME:  Fluoroscopy Time: 26  minutes 6 seconds (2338 mGy). COMPLICATIONS: None immediate. TECHNIQUE: Informed written consent was obtained from the patient after a thorough discussion of the procedural risks, benefits and alternatives. All questions were addressed. Maximal Sterile Barrier Technique was utilized including caps, mask, sterile gowns, sterile gloves, sterile drape, hand hygiene and skin antiseptic. A timeout was performed prior to the initiation of the procedure. The right forearm to the wrist was prepped and draped in the usual sterile manner. The radial artery morphology was then identified with ultrasound and document in the PACS system. A dorsal palmar anastomosis was verified to be present. Using ultrasound guidance, access into the right radial artery was obtained over a 0.018 inch micro guidewire. A 6/7 radial sheath was then inserted without event. The micro guidewire, and obturator were removed. Good aspiration obtained from the side port of the radial sheath. A cocktail of 2.5 mg of verapamil, 2000 units of heparin, and 200 mcg of nitroglycerin was then infused in diluted form without event. A right radial arteriogram was then performed. A 0.035 inch Roadrunner guidewire and a Simmons 2 diagnostic catheter were then advanced to the aortic arch region and selectively positioned in the right vertebral artery, the right common carotid artery, the left common carotid artery and the left subclavian artery. Also performed was a 3D rotational arteriogram of the left anterior circulation via the left common carotid artery injection. 3D reformatted images were obtained on a separate workstation. FINDINGS: Right vertebral artery origin demonstrates wide patency. Vessel is seen to asend to the cranial skull base. Mild to moderate tortuosity is evident in the mid segment of the right vertebral artery. Patency is maintained of the vertebrobasilar junction and the right posterior-inferior cerebellar artery. The basilar artery, the  posterior cerebral arteries, the superior cerebellar arteries and the anterior-inferior cerebellar arteries opacify into the capillary and venous phases. The left subclavian arteriogram demonstrates a normal thyrocervical trunk with its branches. No left lower vertebral artery is identified on this injection. The right common carotid arteriogram demonstrates the right external carotid artery and its major branches to be widely patent. The right internal carotid artery at the bulb to the cranial skull base demonstrates wide patency with a double U shaped configuration of the mid cervical right ICA associated with small focal areas of outpouching suggestive of fibromuscular dysplastic changes. More distally, the petrous, the cavernous and the supraclinoid segments demonstrate wide patency. The right middle cerebral artery and the right anterior cerebral artery opacify into the capillary and venous phases. Arteriogram with a left carotid compression demonstrates no opacification. The  left common carotid arteriogram demonstrates the left external carotid artery and its major branches to be widely patent. The left internal carotid artery at the bulb to the cranial skull base is widely patent with a U-shaped configuration of the mid left ICA segment associated with mild focal areas of smooth outpouching without evidence of stenosis. 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 The petrous, the cavernous and the supraclinoid segments demonstrate wide patency. The left middle cerebral artery opacifies into the capillary and venous phases. The left anterior cerebral artery opacifies into the capillary and venous phases. Arising at the junction of the left anterior cerebral artery A1 A2 junction is a saccular aneurysm projecting horizontally to the right. The 3D rotational images confirm the presence of an approximately 5.1 mm x approximately 3.4 mm saccular aneurysm. Also demonstrated is transient  opacification via the anterior communicating artery of the right anterior cerebral artery A2 segment. ENDOVASCULAR EMBOLIZATION OF THE ANTERIOR COMMUNICATING ARTERY ANEURYSM USING THE WEB 5/2 INTRA SACCULAR FLOW DISRUPTER DEVICE The diagnostic JB 2 catheter in the distal left common carotid artery was then exchanged over a 0.035 inch 300 cm Rosen exchange guidewire for a 95 cm RIST sheath, which was advanced into the proximal left internal carotid artery. The guidewire was removed. A gentle arteriogram through the sheath demonstrated no evidence of spasm, dissection or of intraluminal filling defects. Over a 0.014 inch Aristotle micro guidewire with a moderate J configuration a combination of an 017 150 cm Via microcatheter inside of a 124 cm 5 Chad guide catheter was advanced to the distal end of the RIST sheath. Using a torque device, access was obtained passed the tortuosity of the mid cervical left ICA and into the horizontal petrous segment followed by the microcatheter and the [facility] guide catheter. Using a torque device, the micro guidewire with the microcatheter were then gently advanced into the supraclinoid left ICA. Access was obtained into the left A1 segment followed by the microcatheter. The micro guidewire was then advanced into the mid 1/3 of the aneurysm followed by the microcatheter. The micro guidewire was then retrieved from the aneurysm ensuring no sudden forward motion movement of the microcatheter. None was observed. Free aspiration of blood was obtained from the hub of the intra-aneurysmal microcatheter, which was connected to continuous heparinized saline infusion. Having considered the measurements, it was decided to proceed with placement of a WEB 5/2 SL device. Device was then retrieved from the housing and cleared of any air while submerged in heparinized saline infusion. Under biplane intermittent fluoroscopy, the device was then advanced to the distal end of the intra aneurysmal  microcatheter, where its distal marker was aligned with the distal marker on the microcatheter. Manipulation was then performed under biplane fluoroscopy until complete expansion of the device was evident by manipulation of the device and the microcatheter. A control arteriogram performed through the 5 Chad guide catheter demonstrated excellent apposition of the device in the AP and lateral projections with stasis evident. Patency of the left anterior cerebral artery A1 A2 junction was evident. The proximal marker was seen to jut just proximal to the neck of the aneurysm. With the device held stable the delivery microcatheter was gently retrieved. The device was then detached in the usual manner witnessing movement of the proximal portion of the device. With the partially deployed device still held stable, the microcatheter was advanced to the proximal marker on the device and detachment was complete by withdrawing the micro guidewire. The microcatheter  was then gently retrieved and removed. Control arteriograms performed at 15 and 25 minutes post deployment of the device continued to demonstrate excellent apposition with early stasis within the aneurysm. No evidence of intraluminal filling defects, or of occlusion was seen in the left ACA, MCA or posterior communicating artery regions. Also given was approximately 4.5 mg of Integrilin intra-arterially to ensure no aggregation of platelets at the neck of the device. None was observed. The combination of the 5 Chad guide catheter, and the RIST sheath was then retrieved and removed. Hemostasis was obtained at the right wrist with a wrist band. Distal right radial pulse was verified to be present. A flat panel CT of the brain demonstrated no evidence of hemorrhagic complications. The patient was then extubated. Upon recovery, the patient reports no headaches, nausea, vomiting or visual symptoms. Her speech was normal. She was able to move all fours  equally. She was then transferred to the PACU and then MICU. Overnight she had a mild headache which responded to Tylenol but without any neurological symptoms. She was able to tolerate liquids and semi solids early in the evening and later the following morning. The IV heparin was stopped and the patient switched to aspirin 81 mg a day, and Plavix 75 mg a day. She was then ambulated with assistance and then without. She was then discharged home under the care of her mother. Patient specifically advised to maintain adequate hydration and to avoid smoking. She was asked to refrain from driving for at least 1-61 days. She was also advised to refrain from stooping, bending or lifting weights above 10 pounds for at least 2 weeks. She was advised to restart her preprocedural medications. Patient expressed understanding and agreement with the above management plan. 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 PLAN: Patient advised to continue with Plavix 75 mg a day, and aspirin 81 mg a day for 2 weeks until she comes back for follow-up. Consider weaning off the Plavix at that time and maintaining her on 325 mg of aspirin. Return for follow-up in 2 weeks in the clinic. Electronically Signed   By: Julieanne Cotton M.D.   On: 07/30/2021 08:02   IR 3D Independent Annabell Sabal  Result Date: 07/30/2021 CLINICAL DATA:  History of worsening headaches. Strong family history of ruptured and unruptured brain aneurysms. EXAM: TRANSCATHETER THERAPY EMBOLIZATION COMPARISON:  MRA brain of 05/21/2021 and CT angiogram of the head and neck of 06/19/2021. MEDICATIONS: Heparin 3,000 units IV. Ancef 2 g IV antibiotic was administered within 1 hour of the procedure. ANESTHESIA/SEDATION: General anesthesia. CONTRAST:  Omnipaque 300 approximately 110 mL. FLUOROSCOPY TIME:  Fluoroscopy Time: 26 minutes  6 seconds (2338 mGy). COMPLICATIONS: None immediate. TECHNIQUE: Informed written consent was obtained from the patient after a thorough discussion of the procedural risks, benefits and alternatives. All questions were addressed. Maximal Sterile Barrier Technique was utilized including caps, mask, sterile gowns, sterile gloves, sterile drape, hand hygiene and skin antiseptic. A timeout was performed prior to the initiation of the procedure. The right forearm to the wrist was prepped and draped in the usual sterile manner. The radial artery morphology was then identified with ultrasound and document in the PACS system. A dorsal palmar anastomosis was verified to be present. Using ultrasound guidance, access into the right radial artery was obtained over a 0.018 inch micro guidewire. A 6/7 radial sheath was  then inserted without event. The micro guidewire, and obturator were removed. Good aspiration obtained from the side port of the radial sheath. A cocktail of 2.5 mg of verapamil, 2000 units of heparin, and 200 mcg of nitroglycerin was then infused in diluted form without event. A right radial arteriogram was then performed. A 0.035 inch Roadrunner guidewire and a Simmons 2 diagnostic catheter were then advanced to the aortic arch region and selectively positioned in the right vertebral artery, the right common carotid artery, the left common carotid artery and the left subclavian artery. Also performed was a 3D rotational arteriogram of the left anterior circulation via the left common carotid artery injection. 3D reformatted images were obtained on a separate workstation. FINDINGS: Right vertebral artery origin demonstrates wide patency. Vessel is seen to asend to the cranial skull base. Mild to moderate tortuosity is evident in the mid segment of the right vertebral artery. Patency is maintained of the vertebrobasilar junction and the right posterior-inferior cerebellar artery. The basilar artery, the posterior  cerebral arteries, the superior cerebellar arteries and the anterior-inferior cerebellar arteries opacify into the capillary and venous phases. The left subclavian arteriogram demonstrates a normal thyrocervical trunk with its branches. No left lower vertebral artery is identified on this injection. The right common carotid arteriogram demonstrates the right external carotid artery and its major branches to be widely patent. The right internal carotid artery at the bulb to the cranial skull base demonstrates wide patency with a double U shaped configuration of the mid cervical right ICA associated with small focal areas of outpouching suggestive of fibromuscular dysplastic changes. More distally, the petrous, the cavernous and the supraclinoid segments demonstrate wide patency. The right middle cerebral artery and the right anterior cerebral artery opacify into the capillary and venous phases. Arteriogram with a left carotid compression demonstrates no opacification. The left common carotid arteriogram demonstrates the left external carotid artery and its major branches to be widely patent. The left internal carotid artery at the bulb to the cranial skull base is widely patent with a U-shaped configuration of the mid left ICA segment associated with mild focal areas of smooth outpouching without evidence of stenosis. 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 The petrous, the cavernous and the supraclinoid segments demonstrate wide patency. The left middle cerebral artery opacifies into the capillary and venous phases. The left anterior cerebral artery opacifies into the capillary and venous phases. Arising at the junction of the left anterior cerebral artery A1 A2 junction is a saccular aneurysm projecting horizontally to the right. The 3D rotational images confirm the presence of an approximately 5.1 mm x approximately 3.4 mm saccular aneurysm. Also demonstrated is transient  opacification via the anterior communicating artery of the right anterior cerebral artery A2 segment. ENDOVASCULAR EMBOLIZATION OF THE ANTERIOR COMMUNICATING ARTERY ANEURYSM USING THE WEB 5/2 INTRA SACCULAR FLOW DISRUPTER DEVICE The diagnostic JB 2 catheter in the distal left common carotid artery was then exchanged over a 0.035 inch 300 cm Rosen exchange guidewire for a 95 cm RIST sheath, which was advanced into the proximal left internal carotid artery. The guidewire was removed. A gentle arteriogram through the sheath demonstrated no evidence of spasm, dissection or of intraluminal filling defects. Over a 0.014 inch Aristotle micro guidewire with a moderate J configuration a combination of an 017 150 cm Via microcatheter inside of a 124 cm 5 Chad guide catheter was advanced to the distal end of the RIST sheath. Using a torque device, access was obtained passed  the tortuosity of the mid cervical left ICA and into the horizontal petrous segment followed by the microcatheter and the [facility] guide catheter. Using a torque device, the micro guidewire with the microcatheter were then gently advanced into the supraclinoid left ICA. Access was obtained into the left A1 segment followed by the microcatheter. The micro guidewire was then advanced into the mid 1/3 of the aneurysm followed by the microcatheter. The micro guidewire was then retrieved from the aneurysm ensuring no sudden forward motion movement of the microcatheter. None was observed. Free aspiration of blood was obtained from the hub of the intra-aneurysmal microcatheter, which was connected to continuous heparinized saline infusion. Having considered the measurements, it was decided to proceed with placement of a WEB 5/2 SL device. Device was then retrieved from the housing and cleared of any air while submerged in heparinized saline infusion. Under biplane intermittent fluoroscopy, the device was then advanced to the distal end of the intra aneurysmal  microcatheter, where its distal marker was aligned with the distal marker on the microcatheter. Manipulation was then performed under biplane fluoroscopy until complete expansion of the device was evident by manipulation of the device and the microcatheter. A control arteriogram performed through the 5 Chad guide catheter demonstrated excellent apposition of the device in the AP and lateral projections with stasis evident. Patency of the left anterior cerebral artery A1 A2 junction was evident. The proximal marker was seen to jut just proximal to the neck of the aneurysm. With the device held stable the delivery microcatheter was gently retrieved. The device was then detached in the usual manner witnessing movement of the proximal portion of the device. With the partially deployed device still held stable, the microcatheter was advanced to the proximal marker on the device and detachment was complete by withdrawing the micro guidewire. The microcatheter was then gently retrieved and removed. Control arteriograms performed at 15 and 25 minutes post deployment of the device continued to demonstrate excellent apposition with early stasis within the aneurysm. No evidence of intraluminal filling defects, or of occlusion was seen in the left ACA, MCA or posterior communicating artery regions. Also given was approximately 4.5 mg of Integrilin intra-arterially to ensure no aggregation of platelets at the neck of the device. None was observed. The combination of the 5 Chad guide catheter, and the RIST sheath was then retrieved and removed. Hemostasis was obtained at the right wrist with a wrist band. Distal right radial pulse was verified to be present. A flat panel CT of the brain demonstrated no evidence of hemorrhagic complications. The patient was then extubated. Upon recovery, the patient reports no headaches, nausea, vomiting or visual symptoms. Her speech was normal. She was able to move all fours  equally. She was then transferred to the PACU and then MICU. Overnight she had a mild headache which responded to Tylenol but without any neurological symptoms. She was able to tolerate liquids and semi solids early in the evening and later the following morning. The IV heparin was stopped and the patient switched to aspirin 81 mg a day, and Plavix 75 mg a day. She was then ambulated with assistance and then without. She was then discharged home under the care of her mother. Patient specifically advised to maintain adequate hydration and to avoid smoking. She was asked to refrain from driving for at least 1-61 days. She was also advised to refrain from stooping, bending or lifting weights above 10 pounds for at least 2 weeks. She was  advised to restart her preprocedural medications. Patient expressed understanding and agreement with the above management plan. 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 PLAN: Patient advised to continue with Plavix 75 mg a day, and aspirin 81 mg a day for 2 weeks until she comes back for follow-up. Consider weaning off the Plavix at that time and maintaining her on 325 mg of aspirin. Return for follow-up in 2 weeks in the clinic. Electronically Signed   By: Julieanne Cotton M.D.   On: 07/30/2021 08:02   IR CT Head Ltd  Result Date: 07/30/2021 CLINICAL DATA:  History of worsening headaches. Strong family history of ruptured and unruptured brain aneurysms. EXAM: TRANSCATHETER THERAPY EMBOLIZATION COMPARISON:  MRA brain of 05/21/2021 and CT angiogram of the head and neck of 06/19/2021. MEDICATIONS: Heparin 3,000 units IV. Ancef 2 g IV antibiotic was administered within 1 hour of the procedure. ANESTHESIA/SEDATION: General anesthesia. CONTRAST:  Omnipaque 300 approximately 110 mL. FLUOROSCOPY TIME:  Fluoroscopy Time: 26 minutes 6  seconds (2338 mGy). COMPLICATIONS: None immediate. TECHNIQUE: Informed written consent was obtained from the patient after a thorough discussion of the procedural risks, benefits and alternatives. All questions were addressed. Maximal Sterile Barrier Technique was utilized including caps, mask, sterile gowns, sterile gloves, sterile drape, hand hygiene and skin antiseptic. A timeout was performed prior to the initiation of the procedure. The right forearm to the wrist was prepped and draped in the usual sterile manner. The radial artery morphology was then identified with ultrasound and document in the PACS system. A dorsal palmar anastomosis was verified to be present. Using ultrasound guidance, access into the right radial artery was obtained over a 0.018 inch micro guidewire. A 6/7 radial sheath was then inserted without event. The micro guidewire, and obturator were removed. Good aspiration obtained from the side port of the radial sheath. A cocktail of 2.5 mg of verapamil, 2000 units of heparin, and 200 mcg of nitroglycerin was then infused in diluted form without event. A right radial arteriogram was then performed. A 0.035 inch Roadrunner guidewire and a Simmons 2 diagnostic catheter were then advanced to the aortic arch region and selectively positioned in the right vertebral artery, the right common carotid artery, the left common carotid artery and the left subclavian artery. Also performed was a 3D rotational arteriogram of the left anterior circulation via the left common carotid artery injection. 3D reformatted images were obtained on a separate workstation. FINDINGS: Right vertebral artery origin demonstrates wide patency. Vessel is seen to asend to the cranial skull base. Mild to moderate tortuosity is evident in the mid segment of the right vertebral artery. Patency is maintained of the vertebrobasilar junction and the right posterior-inferior cerebellar artery. The basilar artery, the posterior  cerebral arteries, the superior cerebellar arteries and the anterior-inferior cerebellar arteries opacify into the capillary and venous phases. The left subclavian arteriogram demonstrates a normal thyrocervical trunk with its branches. No left lower vertebral artery is identified on this injection. The right common carotid arteriogram demonstrates the right external carotid artery and its major branches to be widely patent. The right internal carotid artery at the bulb to the cranial skull base demonstrates wide patency with a double U shaped configuration of the mid cervical right ICA associated with small focal areas of outpouching suggestive of fibromuscular dysplastic changes. More distally, the petrous, the cavernous and the supraclinoid segments demonstrate  wide patency. The right middle cerebral artery and the right anterior cerebral artery opacify into the capillary and venous phases. Arteriogram with a left carotid compression demonstrates no opacification. The left common carotid arteriogram demonstrates the left external carotid artery and its major branches to be widely patent. The left internal carotid artery at the bulb to the cranial skull base is widely patent with a U-shaped configuration of the mid left ICA segment associated with mild focal areas of smooth outpouching without evidence of stenosis. 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 The petrous, the cavernous and the supraclinoid segments demonstrate wide patency. The left middle cerebral artery opacifies into the capillary and venous phases. The left anterior cerebral artery opacifies into the capillary and venous phases. Arising at the junction of the left anterior cerebral artery A1 A2 junction is a saccular aneurysm projecting horizontally to the right. The 3D rotational images confirm the presence of an approximately 5.1 mm x approximately 3.4 mm saccular aneurysm. Also demonstrated is transient  opacification via the anterior communicating artery of the right anterior cerebral artery A2 segment. ENDOVASCULAR EMBOLIZATION OF THE ANTERIOR COMMUNICATING ARTERY ANEURYSM USING THE WEB 5/2 INTRA SACCULAR FLOW DISRUPTER DEVICE The diagnostic JB 2 catheter in the distal left common carotid artery was then exchanged over a 0.035 inch 300 cm Rosen exchange guidewire for a 95 cm RIST sheath, which was advanced into the proximal left internal carotid artery. The guidewire was removed. A gentle arteriogram through the sheath demonstrated no evidence of spasm, dissection or of intraluminal filling defects. Over a 0.014 inch Aristotle micro guidewire with a moderate J configuration a combination of an 017 150 cm Via microcatheter inside of a 124 cm 5 Chad guide catheter was advanced to the distal end of the RIST sheath. Using a torque device, access was obtained passed the tortuosity of the mid cervical left ICA and into the horizontal petrous segment followed by the microcatheter and the [facility] guide catheter. Using a torque device, the micro guidewire with the microcatheter were then gently advanced into the supraclinoid left ICA. Access was obtained into the left A1 segment followed by the microcatheter. The micro guidewire was then advanced into the mid 1/3 of the aneurysm followed by the microcatheter. The micro guidewire was then retrieved from the aneurysm ensuring no sudden forward motion movement of the microcatheter. None was observed. Free aspiration of blood was obtained from the hub of the intra-aneurysmal microcatheter, which was connected to continuous heparinized saline infusion. Having considered the measurements, it was decided to proceed with placement of a WEB 5/2 SL device. Device was then retrieved from the housing and cleared of any air while submerged in heparinized saline infusion. Under biplane intermittent fluoroscopy, the device was then advanced to the distal end of the intra aneurysmal  microcatheter, where its distal marker was aligned with the distal marker on the microcatheter. Manipulation was then performed under biplane fluoroscopy until complete expansion of the device was evident by manipulation of the device and the microcatheter. A control arteriogram performed through the 5 Chad guide catheter demonstrated excellent apposition of the device in the AP and lateral projections with stasis evident. Patency of the left anterior cerebral artery A1 A2 junction was evident. The proximal marker was seen to jut just proximal to the neck of the aneurysm. With the device held stable the delivery microcatheter was gently retrieved. The device was then detached in the usual manner witnessing movement of the proximal portion of the device.  With the partially deployed device still held stable, the microcatheter was advanced to the proximal marker on the device and detachment was complete by withdrawing the micro guidewire. The microcatheter was then gently retrieved and removed. Control arteriograms performed at 15 and 25 minutes post deployment of the device continued to demonstrate excellent apposition with early stasis within the aneurysm. No evidence of intraluminal filling defects, or of occlusion was seen in the left ACA, MCA or posterior communicating artery regions. Also given was approximately 4.5 mg of Integrilin intra-arterially to ensure no aggregation of platelets at the neck of the device. None was observed. The combination of the 5 Chad guide catheter, and the RIST sheath was then retrieved and removed. Hemostasis was obtained at the right wrist with a wrist band. Distal right radial pulse was verified to be present. A flat panel CT of the brain demonstrated no evidence of hemorrhagic complications. The patient was then extubated. Upon recovery, the patient reports no headaches, nausea, vomiting or visual symptoms. Her speech was normal. She was able to move all fours  equally. She was then transferred to the PACU and then MICU. Overnight she had a mild headache which responded to Tylenol but without any neurological symptoms. She was able to tolerate liquids and semi solids early in the evening and later the following morning. The IV heparin was stopped and the patient switched to aspirin 81 mg a day, and Plavix 75 mg a day. She was then ambulated with assistance and then without. She was then discharged home under the care of her mother. Patient specifically advised to maintain adequate hydration and to avoid smoking. She was asked to refrain from driving for at least 9-60 days. She was also advised to refrain from stooping, bending or lifting weights above 10 pounds for at least 2 weeks. She was advised to restart her preprocedural medications. Patient expressed understanding and agreement with the above management plan. 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 PLAN: Patient advised to continue with Plavix 75 mg a day, and aspirin 81 mg a day for 2 weeks until she comes back for follow-up. Consider weaning off the Plavix at that time and maintaining her on 325 mg of aspirin. Return for follow-up in 2 weeks in the clinic. Electronically Signed   By: Julieanne Cotton M.D.   On: 07/30/2021 08:02   IR US Guide Vasc Access Right  Result Date: 07/30/2021 CLINICAL DATA:  History of worsening headaches. Strong family history of ruptured and unruptured brain aneurysms. EXAM: TRANSCATHETER THERAPY EMBOLIZATION COMPARISON:  MRA brain of 05/21/2021 and CT angiogram of the head and neck of 06/19/2021. MEDICATIONS: Heparin 3,000 units IV. Ancef 2 g IV antibiotic was administered within 1 hour of the procedure. ANESTHESIA/SEDATION: General anesthesia. CONTRAST:  Omnipaque 300 approximately 110 mL. FLUOROSCOPY TIME:  Fluoroscopy Time: 26  minutes 6 seconds (2338 mGy). COMPLICATIONS: None immediate. TECHNIQUE: Informed written consent was obtained from the patient after a thorough discussion of the procedural risks, benefits and alternatives. All questions were addressed. Maximal Sterile Barrier Technique was utilized including caps, mask, sterile gowns, sterile gloves, sterile drape, hand hygiene and skin antiseptic. A timeout was performed prior to the initiation of the procedure. The right forearm to the wrist was prepped and draped in the usual sterile manner. The radial artery morphology was then identified with ultrasound and document in the PACS  system. A dorsal palmar anastomosis was verified to be present. Using ultrasound guidance, access into the right radial artery was obtained over a 0.018 inch micro guidewire. A 6/7 radial sheath was then inserted without event. The micro guidewire, and obturator were removed. Good aspiration obtained from the side port of the radial sheath. A cocktail of 2.5 mg of verapamil, 2000 units of heparin, and 200 mcg of nitroglycerin was then infused in diluted form without event. A right radial arteriogram was then performed. A 0.035 inch Roadrunner guidewire and a Simmons 2 diagnostic catheter were then advanced to the aortic arch region and selectively positioned in the right vertebral artery, the right common carotid artery, the left common carotid artery and the left subclavian artery. Also performed was a 3D rotational arteriogram of the left anterior circulation via the left common carotid artery injection. 3D reformatted images were obtained on a separate workstation. FINDINGS: Right vertebral artery origin demonstrates wide patency. Vessel is seen to asend to the cranial skull base. Mild to moderate tortuosity is evident in the mid segment of the right vertebral artery. Patency is maintained of the vertebrobasilar junction and the right posterior-inferior cerebellar artery. The basilar artery, the  posterior cerebral arteries, the superior cerebellar arteries and the anterior-inferior cerebellar arteries opacify into the capillary and venous phases. The left subclavian arteriogram demonstrates a normal thyrocervical trunk with its branches. No left lower vertebral artery is identified on this injection. The right common carotid arteriogram demonstrates the right external carotid artery and its major branches to be widely patent. The right internal carotid artery at the bulb to the cranial skull base demonstrates wide patency with a double U shaped configuration of the mid cervical right ICA associated with small focal areas of outpouching suggestive of fibromuscular dysplastic changes. More distally, the petrous, the cavernous and the supraclinoid segments demonstrate wide patency. The right middle cerebral artery and the right anterior cerebral artery opacify into the capillary and venous phases. Arteriogram with a left carotid compression demonstrates no opacification. The left common carotid arteriogram demonstrates the left external carotid artery and its major branches to be widely patent. The left internal carotid artery at the bulb to the cranial skull base is widely patent with a U-shaped configuration of the mid left ICA segment associated with mild focal areas of smooth outpouching without evidence of stenosis. 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 The petrous, the cavernous and the supraclinoid segments demonstrate wide patency. The left middle cerebral artery opacifies into the capillary and venous phases. The left anterior cerebral artery opacifies into the capillary and venous phases. Arising at the junction of the left anterior cerebral artery A1 A2 junction is a saccular aneurysm projecting horizontally to the right. The 3D rotational images confirm the presence of an approximately 5.1 mm x approximately 3.4 mm saccular aneurysm. Also demonstrated is transient  opacification via the anterior communicating artery of the right anterior cerebral artery A2 segment. ENDOVASCULAR EMBOLIZATION OF THE ANTERIOR COMMUNICATING ARTERY ANEURYSM USING THE WEB 5/2 INTRA SACCULAR FLOW DISRUPTER DEVICE The diagnostic JB 2 catheter in the distal left common carotid artery was then exchanged over a 0.035 inch 300 cm Rosen exchange guidewire for a 95 cm RIST sheath, which was advanced into the proximal left internal carotid artery. The guidewire was removed. A gentle arteriogram through the sheath demonstrated no evidence of spasm, dissection or of intraluminal filling defects. Over a 0.014 inch Aristotle micro guidewire with a moderate J configuration a combination of an 017  150 cm Via microcatheter inside of a 124 cm 5 Chad guide catheter was advanced to the distal end of the RIST sheath. Using a torque device, access was obtained passed the tortuosity of the mid cervical left ICA and into the horizontal petrous segment followed by the microcatheter and the [employer] [facility] guide catheter. Using a torque device, the micro guidewire with the microcatheter were then gently advanced into the supraclinoid left ICA. Access was obtained into the left A1 segment followed by the microcatheter. The micro guidewire was then advanced into the mid 1/3 of the aneurysm followed by the microcatheter. The micro guidewire was then retrieved from the aneurysm ensuring no sudden forward motion movement of the microcatheter. None was observed. Free aspiration of blood was obtained from the hub of the intra-aneurysmal microcatheter, which was connected to continuous heparinized saline infusion. Having considered the measurements, it was decided to proceed with placement of a WEB 5/2 SL device. Device was then retrieved from the housing and cleared of any air while submerged in heparinized saline infusion. Under biplane intermittent fluoroscopy, the device was then advanced to the distal end of the intra aneurysmal  microcatheter, where its distal marker was aligned with the distal marker on the microcatheter. Manipulation was then performed under biplane fluoroscopy until complete expansion of the device was evident by manipulation of the device and the microcatheter. A control arteriogram performed through the 5 Chad guide catheter demonstrated excellent apposition of the device in the AP and lateral projections with stasis evident. Patency of the left anterior cerebral artery A1 A2 junction was evident. The proximal marker was seen to jut just proximal to the neck of the aneurysm. With the device held stable the delivery microcatheter was gently retrieved. The device was then detached in the usual manner witnessing movement of the proximal portion of the device. With the partially deployed device still held stable, the microcatheter was advanced to the proximal marker on the device and detachment was complete by withdrawing the micro guidewire. The microcatheter was then gently retrieved and removed. Control arteriograms performed at 15 and 25 minutes post deployment of the device continued to demonstrate excellent apposition with early stasis within the aneurysm. No evidence of intraluminal filling defects, or of occlusion was seen in the left ACA, MCA or posterior communicating artery regions. Also given was approximately 4.5 mg of Integrilin intra-arterially to ensure no aggregation of platelets at the neck of the device. None was observed. The combination of the 5 Chad guide catheter, and the RIST sheath was then retrieved and removed. Hemostasis was obtained at the right wrist with a wrist band. Distal right radial pulse was verified to be present. A flat panel CT of the brain demonstrated no evidence of hemorrhagic complications. The patient was then extubated. Upon recovery, the patient reports no headaches, nausea, vomiting or visual symptoms. Her speech was normal. She was able to move all fours  equally. She was then transferred to the PACU and then MICU. Overnight she had a mild headache which responded to Tylenol but without any neurological symptoms. She was able to tolerate liquids and semi solids early in the evening and later the following morning. The IV heparin was stopped and the patient switched to aspirin 81 mg a day, and Plavix 75 mg a day. She was then ambulated with assistance and then without. She was then discharged home under the care of her mother. Patient specifically advised to maintain adequate hydration and to avoid smoking. She was  asked to refrain from driving for at least 1-61 days. She was also advised to refrain from stooping, bending or lifting weights above 10 pounds for at least 2 weeks. She was advised to restart her preprocedural medications. Patient expressed understanding and agreement with the above management plan. 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 PLAN: Patient advised to continue with Plavix 75 mg a day, and aspirin 81 mg a day for 2 weeks until she comes back for follow-up. Consider weaning off the Plavix at that time and maintaining her on 325 mg of aspirin. Return for follow-up in 2 weeks in the clinic. Electronically Signed   By: Julieanne Cotton M.D.   On: 07/30/2021 08:02   IR Radiologist Eval & Mgmt  Result Date: 08/15/2021 EXAM: ESTABLISHED PATIENT OFFICE VISIT CHIEF COMPLAINT: Intermittent headaches. 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 Patient was then discharged the following day under the care of her mother. Patient returns  today for follow-up accompanied by her mother. Clinically, the patient reports symptoms of supraorbital to bifrontal headaches 2-3 days on returning home. She describes these headaches being sharp in character lasting about 2-3 minutes without any associated symptoms of nausea, vomiting or vision difficulties. However, on close questioning she reports she sees a blind spot in the left visual field which dissipates. The patient reports relief with Tylenol which lasts for about 4-6 hours. She may have more than 1 headache a day. Patient is able to continue with her daily routine activities, otherwise. She reports no episodes of loss of awareness or of seizures. She denies any symptoms of chest pain, shortness of breath or palpitations. She denies any cough, wheezing or sputum production. Denies any difficulty with swallowing liquids or of chewing or in swallowing solids. Denies any abdominal pains, constipation, diarrhea or melena. She denies any symptoms of dysuria, hematuria or of polyuria. She denies any recent chills, fever or rigors. Past Medical History: Unchanged. Medications: cetirizine 10 MG tablet commonly known as: ZYRTEC take 10 mg by mouth daily. clopidogrel 75 MG tablet commonly known as: Plavix take 1 tablet (75 mg total) by mouth daily for 14 days. Depo-SubQ Provera 104 104 MG/0.65ML injection generic drug: Medroxy PROGESTERone every 3 (three) months. diclofenac Sodium 1 % Gel commonly known as: Voltaren apply 2 g topically 4 (four) times daily. folic acid 1 MG tablet commonly known as: FOLVITE take 1 tablet (1 mg total) by mouth daily. gabapentin 600 MG tablet commonly known as: NEURONTIN take 600 mg by mouth 3 (three) times daily. hydroxychloroquine 200 MG tablet commonly known as: PLAQUENIL TAKE 1 TABLET(200 MG) BY MOUTH DAILY meloxicam 15 MG tablet commonly known as: MOBIC take 1 tablet (15 mg total) by mouth daily. QUEtiapine 100 MG tablet commonly known as: SEROQUEL take 100 mg by mouth daily.  Vitamin D (Ergocalciferol) 1.25 MG (50000 UNIT) Caps capsule commonly known as: DRISDOL take 50,000 Units by mouth once a week. Diagnosis * : Date . * : Aneurysm (HCC) * : . * : Arthritis * : * : RA . * : Kidney stones * : . * : Lupus (HCC) * : 01/17/2021 . * : Schizophrenia (HCC) * : . * :  Systemic lupus erythematosus (HCC) * : Past Surgical History: Procedure * : Laterality * : Date . * : CESAREAN SECTION * : * : . * : HERNIA REPAIR * : * : . * : IR RADIOLOGIST EVAL & MGMT Allergies: None known. Social History: Denies use of alcohol or of cigarettes or illicit chemicals Family History:  As noted previously. REVIEW OF SYSTEMS: Negative unless as mentioned above. PHYSICAL EXAMINATION: Appears in no acute distress. Awake, alert, oriented to time, place, space. Appropriate affect. Normal responses. No abnormal lateralizing neurologic features noted. Station and gait normal. ASSESSMENT AND PLAN: Advised patient to maintain adequate hydration and to continue with aspirin 81 mg a day and Plavix 37.5 mg a day for 2 weeks. Thereafter, stop the Plavix and continue with 325 mg of aspirin per day. Follow-up MRI of the brain and MRA of the brain in late August/early September 2023. Patient was also advised to connect with her rheumatologist and primary care for her medical issues. Patient also advised to call in approximately 2 weeks regarding her headaches. Patient and mother expressed understanding and agreement with the above management plan. Electronically Signed   By: Julieanne Cotton M.D.   On: 08/15/2021 08:29   IR ANGIO INTRA EXTRACRAN SEL INTERNAL CAROTID BILAT MOD SED  Result Date: 07/30/2021 CLINICAL DATA:  History of worsening headaches. Strong family history of ruptured and unruptured brain aneurysms. EXAM: TRANSCATHETER THERAPY EMBOLIZATION COMPARISON:  MRA brain of 05/21/2021 and CT angiogram of the head and neck of 06/19/2021. MEDICATIONS: Heparin 3,000 units IV. Ancef 2 g IV antibiotic was administered  within 1 hour of the procedure. ANESTHESIA/SEDATION: General anesthesia. CONTRAST:  Omnipaque 300 approximately 110 mL. FLUOROSCOPY TIME:  Fluoroscopy Time: 26 minutes 6 seconds (2338 mGy). COMPLICATIONS: None immediate. TECHNIQUE: Informed written consent was obtained from the patient after a thorough discussion of the procedural risks, benefits and alternatives. All questions were addressed. Maximal Sterile Barrier Technique was utilized including caps, mask, sterile gowns, sterile gloves, sterile drape, hand hygiene and skin antiseptic. A timeout was performed prior to the initiation of the procedure. The right forearm to the wrist was prepped and draped in the usual sterile manner. The radial artery morphology was then identified with ultrasound and document in the PACS system. A dorsal palmar anastomosis was verified to be present. Using ultrasound guidance, access into the right radial artery was obtained over a 0.018 inch micro guidewire. A 6/7 radial sheath was then inserted without event. The micro guidewire, and obturator were removed. Good aspiration obtained from the side port of the radial sheath. A cocktail of 2.5 mg of verapamil, 2000 units of heparin, and 200 mcg of nitroglycerin was then infused in diluted form without event. A right radial arteriogram was then performed. A 0.035 inch Roadrunner guidewire and a Simmons 2 diagnostic catheter were then advanced to the aortic arch region and selectively positioned in the right vertebral artery, the right common carotid artery, the left common carotid artery and the left subclavian artery. Also performed was a 3D rotational arteriogram of the left anterior circulation via the left common carotid artery injection. 3D reformatted images were obtained on a separate workstation. FINDINGS: Right vertebral artery origin demonstrates wide patency. Vessel is seen to asend to the cranial skull base. Mild to moderate tortuosity is evident in the mid segment of  the right vertebral artery. Patency is maintained of the vertebrobasilar junction and the right posterior-inferior cerebellar artery. The basilar artery, the posterior cerebral arteries, the superior cerebellar arteries  and the anterior-inferior cerebellar arteries opacify into the capillary and venous phases. The left subclavian arteriogram demonstrates a normal thyrocervical trunk with its branches. No left lower vertebral artery is identified on this injection. The right common carotid arteriogram demonstrates the right external carotid artery and its major branches to be widely patent. The right internal carotid artery at the bulb to the cranial skull base demonstrates wide patency with a double U shaped configuration of the mid cervical right ICA associated with small focal areas of outpouching suggestive of fibromuscular dysplastic changes. More distally, the petrous, the cavernous and the supraclinoid segments demonstrate wide patency. The right middle cerebral artery and the right anterior cerebral artery opacify into the capillary and venous phases. Arteriogram with a left carotid compression demonstrates no opacification. The left common carotid arteriogram demonstrates the left external carotid artery and its major branches to be widely patent. The left internal carotid artery at the bulb to the cranial skull base is widely patent with a U-shaped configuration of the mid left ICA segment associated with mild focal areas of smooth outpouching without evidence of stenosis. 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 The petrous, the cavernous and the supraclinoid segments demonstrate wide patency. The left middle cerebral artery opacifies into the capillary and venous phases. The left anterior cerebral artery opacifies into the capillary and venous phases. Arising at the junction of the left anterior cerebral artery A1 A2 junction is a saccular aneurysm projecting horizontally to  the right. The 3D rotational images confirm the presence of an approximately 5.1 mm x approximately 3.4 mm saccular aneurysm. Also demonstrated is transient opacification via the anterior communicating artery of the right anterior cerebral artery A2 segment. ENDOVASCULAR EMBOLIZATION OF THE ANTERIOR COMMUNICATING ARTERY ANEURYSM USING THE WEB 5/2 INTRA SACCULAR FLOW DISRUPTER DEVICE The diagnostic JB 2 catheter in the distal left common carotid artery was then exchanged over a 0.035 inch 300 cm Rosen exchange guidewire for a 95 cm RIST sheath, which was advanced into the proximal left internal carotid artery. The guidewire was removed. A gentle arteriogram through the sheath demonstrated no evidence of spasm, dissection or of intraluminal filling defects. Over a 0.014 inch Aristotle micro guidewire with a moderate J configuration a combination of an 017 150 cm Via microcatheter inside of a 124 cm 5 Chad guide catheter was advanced to the distal end of the RIST sheath. Using a torque device, access was obtained passed the tortuosity of the mid cervical left ICA and into the horizontal petrous segment followed by the microcatheter and the [facility] guide catheter. Using a torque device, the micro guidewire with the microcatheter were then gently advanced into the supraclinoid left ICA. Access was obtained into the left A1 segment followed by the microcatheter. The micro guidewire was then advanced into the mid 1/3 of the aneurysm followed by the microcatheter. The micro guidewire was then retrieved from the aneurysm ensuring no sudden forward motion movement of the microcatheter. None was observed. Free aspiration of blood was obtained from the hub of the intra-aneurysmal microcatheter, which was connected to continuous heparinized saline infusion. Having considered the measurements, it was decided to proceed with placement of a WEB 5/2 SL device. Device was then retrieved from the housing and cleared of any air  while submerged in heparinized saline infusion. Under biplane intermittent fluoroscopy, the device was then advanced to the distal end of the intra aneurysmal microcatheter, where its distal marker was aligned with the distal marker on the microcatheter. Manipulation  was then performed under biplane fluoroscopy until complete expansion of the device was evident by manipulation of the device and the microcatheter. A control arteriogram performed through the 5 Chad guide catheter demonstrated excellent apposition of the device in the AP and lateral projections with stasis evident. Patency of the left anterior cerebral artery A1 A2 junction was evident. The proximal marker was seen to jut just proximal to the neck of the aneurysm. With the device held stable the delivery microcatheter was gently retrieved. The device was then detached in the usual manner witnessing movement of the proximal portion of the device. With the partially deployed device still held stable, the microcatheter was advanced to the proximal marker on the device and detachment was complete by withdrawing the micro guidewire. The microcatheter was then gently retrieved and removed. Control arteriograms performed at 15 and 25 minutes post deployment of the device continued to demonstrate excellent apposition with early stasis within the aneurysm. No evidence of intraluminal filling defects, or of occlusion was seen in the left ACA, MCA or posterior communicating artery regions. Also given was approximately 4.5 mg of Integrilin intra-arterially to ensure no aggregation of platelets at the neck of the device. None was observed. The combination of the 5 Chad guide catheter, and the RIST sheath was then retrieved and removed. Hemostasis was obtained at the right wrist with a wrist band. Distal right radial pulse was verified to be present. A flat panel CT of the brain demonstrated no evidence of hemorrhagic complications. The patient was  then extubated. Upon recovery, the patient reports no headaches, nausea, vomiting or visual symptoms. Her speech was normal. She was able to move all fours equally. She was then transferred to the PACU and then MICU. Overnight she had a mild headache which responded to Tylenol but without any neurological symptoms. She was able to tolerate liquids and semi solids early in the evening and later the following morning. The IV heparin was stopped and the patient switched to aspirin 81 mg a day, and Plavix 75 mg a day. She was then ambulated with assistance and then without. She was then discharged home under the care of her mother. Patient specifically advised to maintain adequate hydration and to avoid smoking. She was asked to refrain from driving for at least 1-61 days. She was also advised to refrain from stooping, bending or lifting weights above 10 pounds for at least 2 weeks. She was advised to restart her preprocedural medications. Patient expressed understanding and agreement with the above management plan. 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 PLAN: Patient advised to continue with Plavix 75 mg a day, and aspirin 81 mg a day for 2 weeks until she comes back for follow-up. Consider weaning off the Plavix at that time and maintaining her on 325 mg of aspirin. Return for follow-up in 2 weeks in the clinic. Electronically Signed   By: Julieanne Cotton M.D.   On: 07/30/2021 08:02   IR ANGIO VERTEBRAL SEL VERTEBRAL UNI L MOD SED  Result Date: 07/30/2021 CLINICAL DATA:  History of worsening headaches. Strong family history of ruptured and unruptured brain aneurysms. EXAM: TRANSCATHETER THERAPY EMBOLIZATION COMPARISON:  MRA brain of 05/21/2021 and CT angiogram of the head and neck of 06/19/2021. MEDICATIONS: Heparin 3,000 units IV. Ancef 2 g IV antibiotic was  administered within 1 hour of the procedure. ANESTHESIA/SEDATION:  General anesthesia. CONTRAST:  Omnipaque 300 approximately 110 mL. FLUOROSCOPY TIME:  Fluoroscopy Time: 26 minutes 6 seconds (2338 mGy). COMPLICATIONS: None immediate. TECHNIQUE: Informed written consent was obtained from the patient after a thorough discussion of the procedural risks, benefits and alternatives. All questions were addressed. Maximal Sterile Barrier Technique was utilized including caps, mask, sterile gowns, sterile gloves, sterile drape, hand hygiene and skin antiseptic. A timeout was performed prior to the initiation of the procedure. The right forearm to the wrist was prepped and draped in the usual sterile manner. The radial artery morphology was then identified with ultrasound and document in the PACS system. A dorsal palmar anastomosis was verified to be present. Using ultrasound guidance, access into the right radial artery was obtained over a 0.018 inch micro guidewire. A 6/7 radial sheath was then inserted without event. The micro guidewire, and obturator were removed. Good aspiration obtained from the side port of the radial sheath. A cocktail of 2.5 mg of verapamil, 2000 units of heparin, and 200 mcg of nitroglycerin was then infused in diluted form without event. A right radial arteriogram was then performed. A 0.035 inch Roadrunner guidewire and a Simmons 2 diagnostic catheter were then advanced to the aortic arch region and selectively positioned in the right vertebral artery, the right common carotid artery, the left common carotid artery and the left subclavian artery. Also performed was a 3D rotational arteriogram of the left anterior circulation via the left common carotid artery injection. 3D reformatted images were obtained on a separate workstation. FINDINGS: Right vertebral artery origin demonstrates wide patency. Vessel is seen to asend to the cranial skull base. Mild to moderate tortuosity is evident in the mid  segment of the right vertebral artery. Patency is maintained of the vertebrobasilar junction and the right posterior-inferior cerebellar artery. The basilar artery, the posterior cerebral arteries, the superior cerebellar arteries and the anterior-inferior cerebellar arteries opacify into the capillary and venous phases. The left subclavian arteriogram demonstrates a normal thyrocervical trunk with its branches. No left lower vertebral artery is identified on this injection. The right common carotid arteriogram demonstrates the right external carotid artery and its major branches to be widely patent. The right internal carotid artery at the bulb to the cranial skull base demonstrates wide patency with a double U shaped configuration of the mid cervical right ICA associated with small focal areas of outpouching suggestive of fibromuscular dysplastic changes. More distally, the petrous, the cavernous and the supraclinoid segments demonstrate wide patency. The right middle cerebral artery and the right anterior cerebral artery opacify into the capillary and venous phases. Arteriogram with a left carotid compression demonstrates no opacification. The left common carotid arteriogram demonstrates the left external carotid artery and its major branches to be widely patent. The left internal carotid artery at the bulb to the cranial skull base is widely patent with a U-shaped configuration of the mid left ICA segment associated with mild focal areas of smooth outpouching without evidence of stenosis. 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 The petrous, the cavernous and the supraclinoid segments demonstrate wide patency. The left middle cerebral artery opacifies into the capillary and venous phases. The left anterior cerebral artery opacifies into the capillary and venous phases. Arising at the junction of the left anterior cerebral artery A1 A2 junction is a saccular aneurysm projecting  horizontally to the right. The 3D rotational images confirm the presence of an approximately 5.1 mm x approximately 3.4 mm saccular aneurysm. Also demonstrated is transient opacification via  the anterior communicating artery of the right anterior cerebral artery A2 segment. ENDOVASCULAR EMBOLIZATION OF THE ANTERIOR COMMUNICATING ARTERY ANEURYSM USING THE WEB 5/2 INTRA SACCULAR FLOW DISRUPTER DEVICE The diagnostic JB 2 catheter in the distal left common carotid artery was then exchanged over a 0.035 inch 300 cm Rosen exchange guidewire for a 95 cm RIST sheath, which was advanced into the proximal left internal carotid artery. The guidewire was removed. A gentle arteriogram through the sheath demonstrated no evidence of spasm, dissection or of intraluminal filling defects. Over a 0.014 inch Aristotle micro guidewire with a moderate J configuration a combination of an 017 150 cm Via microcatheter inside of a 124 cm 5 Chad guide catheter was advanced to the distal end of the RIST sheath. Using a torque device, access was obtained passed the tortuosity of the mid cervical left ICA and into the horizontal petrous segment followed by the microcatheter and the [facility] guide catheter. Using a torque device, the micro guidewire with the microcatheter were then gently advanced into the supraclinoid left ICA. Access was obtained into the left A1 segment followed by the microcatheter. The micro guidewire was then advanced into the mid 1/3 of the aneurysm followed by the microcatheter. The micro guidewire was then retrieved from the aneurysm ensuring no sudden forward motion movement of the microcatheter. None was observed. Free aspiration of blood was obtained from the hub of the intra-aneurysmal microcatheter, which was connected to continuous heparinized saline infusion. Having considered the measurements, it was decided to proceed with placement of a WEB 5/2 SL device. Device was then retrieved from the housing and  cleared of any air while submerged in heparinized saline infusion. Under biplane intermittent fluoroscopy, the device was then advanced to the distal end of the intra aneurysmal microcatheter, where its distal marker was aligned with the distal marker on the microcatheter. Manipulation was then performed under biplane fluoroscopy until complete expansion of the device was evident by manipulation of the device and the microcatheter. A control arteriogram performed through the 5 Chad guide catheter demonstrated excellent apposition of the device in the AP and lateral projections with stasis evident. Patency of the left anterior cerebral artery A1 A2 junction was evident. The proximal marker was seen to jut just proximal to the neck of the aneurysm. With the device held stable the delivery microcatheter was gently retrieved. The device was then detached in the usual manner witnessing movement of the proximal portion of the device. With the partially deployed device still held stable, the microcatheter was advanced to the proximal marker on the device and detachment was complete by withdrawing the micro guidewire. The microcatheter was then gently retrieved and removed. Control arteriograms performed at 15 and 25 minutes post deployment of the device continued to demonstrate excellent apposition with early stasis within the aneurysm. No evidence of intraluminal filling defects, or of occlusion was seen in the left ACA, MCA or posterior communicating artery regions. Also given was approximately 4.5 mg of Integrilin intra-arterially to ensure no aggregation of platelets at the neck of the device. None was observed. The combination of the 5 Chad guide catheter, and the RIST sheath was then retrieved and removed. Hemostasis was obtained at the right wrist with a wrist band. Distal right radial pulse was verified to be present. A flat panel CT of the brain demonstrated no evidence of hemorrhagic  complications. The patient was then extubated. Upon recovery, the patient reports no headaches, nausea, vomiting or visual symptoms. Her speech  was normal. She was able to move all fours equally. She was then transferred to the PACU and then MICU. Overnight she had a mild headache which responded to Tylenol but without any neurological symptoms. She was able to tolerate liquids and semi solids early in the evening and later the following morning. The IV heparin was stopped and the patient switched to aspirin 81 mg a day, and Plavix 75 mg a day. She was then ambulated with assistance and then without. She was then discharged home under the care of her mother. Patient specifically advised to maintain adequate hydration and to avoid smoking. She was asked to refrain from driving for at least 1-61 days. She was also advised to refrain from stooping, bending or lifting weights above 10 pounds for at least 2 weeks. She was advised to restart her preprocedural medications. Patient expressed understanding and agreement with the above management plan. 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 PLAN: Patient advised to continue with Plavix 75 mg a day, and aspirin 81 mg a day for 2 weeks until she comes back for follow-up. Consider weaning off the Plavix at that time and maintaining her on 325 mg of aspirin. Return for follow-up in 2 weeks in the clinic. Electronically Signed   By: Julieanne Cotton M.D.   On: 07/30/2021 08:02   IR NEURO EACH ADD'L AFTER BASIC UNI LEFT (MS)  Result Date: 07/30/2021 CLINICAL DATA:  History of worsening headaches. Strong family history of ruptured and unruptured brain aneurysms. EXAM: TRANSCATHETER THERAPY EMBOLIZATION COMPARISON:  MRA brain of 05/21/2021 and CT angiogram of the head and neck of 06/19/2021. MEDICATIONS: Heparin 3,000 units  IV. Ancef 2 g IV antibiotic was administered within 1 hour of the procedure. ANESTHESIA/SEDATION: General anesthesia. CONTRAST:  Omnipaque 300 approximately 110 mL. FLUOROSCOPY TIME:  Fluoroscopy Time: 26 minutes 6 seconds (2338 mGy). COMPLICATIONS: None immediate. TECHNIQUE: Informed written consent was obtained from the patient after a thorough discussion of the procedural risks, benefits and alternatives. All questions were addressed. Maximal Sterile Barrier Technique was utilized including caps, mask, sterile gowns, sterile gloves, sterile drape, hand hygiene and skin antiseptic. A timeout was performed prior to the initiation of the procedure. The right forearm to the wrist was prepped and draped in the usual sterile manner. The radial artery morphology was then identified with ultrasound and document in the PACS system. A dorsal palmar anastomosis was verified to be present. Using ultrasound guidance, access into the right radial artery was obtained over a 0.018 inch micro guidewire. A 6/7 radial sheath was then inserted without event. The micro guidewire, and obturator were removed. Good aspiration obtained from the side port of the radial sheath. A cocktail of 2.5 mg of verapamil, 2000 units of heparin, and 200 mcg of nitroglycerin was then infused in diluted form without event. A right radial arteriogram was then performed. A 0.035 inch Roadrunner guidewire and a Simmons 2 diagnostic catheter were then advanced to the aortic arch region and selectively positioned in the right vertebral artery, the right common carotid artery, the left common carotid artery and the left subclavian artery. Also performed was a 3D rotational arteriogram of the left anterior circulation via the left common carotid artery injection. 3D reformatted images were obtained on a separate workstation. FINDINGS: Right vertebral artery origin demonstrates wide patency. Vessel is seen to asend to the cranial skull base.  Mild to moderate  tortuosity is evident in the mid segment of the right vertebral artery. Patency is maintained of the vertebrobasilar junction and the right posterior-inferior cerebellar artery. The basilar artery, the posterior cerebral arteries, the superior cerebellar arteries and the anterior-inferior cerebellar arteries opacify into the capillary and venous phases. The left subclavian arteriogram demonstrates a normal thyrocervical trunk with its branches. No left lower vertebral artery is identified on this injection. The right common carotid arteriogram demonstrates the right external carotid artery and its major branches to be widely patent. The right internal carotid artery at the bulb to the cranial skull base demonstrates wide patency with a double U shaped configuration of the mid cervical right ICA associated with small focal areas of outpouching suggestive of fibromuscular dysplastic changes. More distally, the petrous, the cavernous and the supraclinoid segments demonstrate wide patency. The right middle cerebral artery and the right anterior cerebral artery opacify into the capillary and venous phases. Arteriogram with a left carotid compression demonstrates no opacification. The left common carotid arteriogram demonstrates the left external carotid artery and its major branches to be widely patent. The left internal carotid artery at the bulb to the cranial skull base is widely patent with a U-shaped configuration of the mid left ICA segment associated with mild focal areas of smooth outpouching without evidence of stenosis. 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 The petrous, the cavernous and the supraclinoid segments demonstrate wide patency. The left middle cerebral artery opacifies into the capillary and venous phases. The left anterior cerebral artery opacifies into the capillary and venous phases. Arising at the junction of the left anterior cerebral artery A1 A2 junction is a  saccular aneurysm projecting horizontally to the right. The 3D rotational images confirm the presence of an approximately 5.1 mm x approximately 3.4 mm saccular aneurysm. Also demonstrated is transient opacification via the anterior communicating artery of the right anterior cerebral artery A2 segment. ENDOVASCULAR EMBOLIZATION OF THE ANTERIOR COMMUNICATING ARTERY ANEURYSM USING THE WEB 5/2 INTRA SACCULAR FLOW DISRUPTER DEVICE The diagnostic JB 2 catheter in the distal left common carotid artery was then exchanged over a 0.035 inch 300 cm Rosen exchange guidewire for a 95 cm RIST sheath, which was advanced into the proximal left internal carotid artery. The guidewire was removed. A gentle arteriogram through the sheath demonstrated no evidence of spasm, dissection or of intraluminal filling defects. Over a 0.014 inch Aristotle micro guidewire with a moderate J configuration a combination of an 017 150 cm Via microcatheter inside of a 124 cm 5 Chad guide catheter was advanced to the distal end of the RIST sheath. Using a torque device, access was obtained passed the tortuosity of the mid cervical left ICA and into the horizontal petrous segment followed by the microcatheter and the [facility] guide catheter. Using a torque device, the micro guidewire with the microcatheter were then gently advanced into the supraclinoid left ICA. Access was obtained into the left A1 segment followed by the microcatheter. The micro guidewire was then advanced into the mid 1/3 of the aneurysm followed by the microcatheter. The micro guidewire was then retrieved from the aneurysm ensuring no sudden forward motion movement of the microcatheter. None was observed. Free aspiration of blood was obtained from the hub of the intra-aneurysmal microcatheter, which was connected to continuous heparinized saline infusion. Having considered the measurements, it was decided to proceed with placement of a WEB 5/2 SL device. Device was then  retrieved from the housing and cleared of  any air while submerged in heparinized saline infusion. Under biplane intermittent fluoroscopy, the device was then advanced to the distal end of the intra aneurysmal microcatheter, where its distal marker was aligned with the distal marker on the microcatheter. Manipulation was then performed under biplane fluoroscopy until complete expansion of the device was evident by manipulation of the device and the microcatheter. A control arteriogram performed through the 5 Chad guide catheter demonstrated excellent apposition of the device in the AP and lateral projections with stasis evident. Patency of the left anterior cerebral artery A1 A2 junction was evident. The proximal marker was seen to jut just proximal to the neck of the aneurysm. With the device held stable the delivery microcatheter was gently retrieved. The device was then detached in the usual manner witnessing movement of the proximal portion of the device. With the partially deployed device still held stable, the microcatheter was advanced to the proximal marker on the device and detachment was complete by withdrawing the micro guidewire. The microcatheter was then gently retrieved and removed. Control arteriograms performed at 15 and 25 minutes post deployment of the device continued to demonstrate excellent apposition with early stasis within the aneurysm. No evidence of intraluminal filling defects, or of occlusion was seen in the left ACA, MCA or posterior communicating artery regions. Also given was approximately 4.5 mg of Integrilin intra-arterially to ensure no aggregation of platelets at the neck of the device. None was observed. The combination of the 5 Chad guide catheter, and the RIST sheath was then retrieved and removed. Hemostasis was obtained at the right wrist with a wrist band. Distal right radial pulse was verified to be present. A flat panel CT of the brain demonstrated no  evidence of hemorrhagic complications. The patient was then extubated. Upon recovery, the patient reports no headaches, nausea, vomiting or visual symptoms. Her speech was normal. She was able to move all fours equally. She was then transferred to the PACU and then MICU. Overnight she had a mild headache which responded to Tylenol but without any neurological symptoms. She was able to tolerate liquids and semi solids early in the evening and later the following morning. The IV heparin was stopped and the patient switched to aspirin 81 mg a day, and Plavix 75 mg a day. She was then ambulated with assistance and then without. She was then discharged home under the care of her mother. Patient specifically advised to maintain adequate hydration and to avoid smoking. She was asked to refrain from driving for at least 1-61 days. She was also advised to refrain from stooping, bending or lifting weights above 10 pounds for at least 2 weeks. She was advised to restart her preprocedural medications. Patient expressed understanding and agreement with the above management plan. 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 PLAN: Patient advised to continue with Plavix 75 mg a day, and aspirin 81 mg a day for 2 weeks until she comes back for follow-up. Consider weaning off the Plavix at that time and maintaining her on 325 mg of aspirin. Return for follow-up in 2 weeks in the clinic. Electronically Signed   By: Julieanne Cotton M.D.   On: 07/30/2021 08:02    Recent Labs: Lab Results  Component Value Date   WBC 8.7 07/29/2021   HGB 9.3 (L) 07/29/2021   PLT 178 07/29/2021   NA 139  07/28/2021   K 3.4 (L) 07/28/2021   CL 110 07/28/2021   CO2 22 07/28/2021   GLUCOSE 104 (H) 07/28/2021   BUN 5 (L) 07/28/2021   CREATININE 0.70 07/28/2021   BILITOT 0.6 05/20/2021   ALKPHOS  71 02/27/2021   AST 21 05/20/2021   ALT 12 05/20/2021   PROT 7.3 05/20/2021   ALBUMIN 4.4 02/27/2021   CALCIUM 8.8 (L) 07/28/2021   GFRAA 117 05/20/2020    Speciality Comments: LMOM for Dr. Laruth Bouchard office to fax PLQ eye exam 06/18/2021 shc  Procedures:  No procedures performed Allergies: Patient has no known allergies.   Assessment / Plan:     Visit Diagnoses: No diagnosis found.  ***  Orders: No orders of the defined types were placed in this encounter.  No orders of the defined types were placed in this encounter.    Follow-Up Instructions: No follow-ups on file.   Ellen Henri, CMA  Note - This record has been created using Animal nutritionist.  Chart creation errors have been sought, but may not always  have been located. Such creation errors do not reflect on  the standard of medical care.

## 2021-08-28 ENCOUNTER — Ambulatory Visit: Payer: Medicaid Other | Admitting: Pharmacist

## 2021-08-28 ENCOUNTER — Other Ambulatory Visit (HOSPITAL_BASED_OUTPATIENT_CLINIC_OR_DEPARTMENT_OTHER): Payer: Self-pay

## 2021-08-28 ENCOUNTER — Other Ambulatory Visit: Payer: Medicaid Other

## 2021-09-02 ENCOUNTER — Ambulatory Visit (INDEPENDENT_AMBULATORY_CARE_PROVIDER_SITE_OTHER): Payer: Medicaid Other | Admitting: Pharmacist

## 2021-09-02 ENCOUNTER — Other Ambulatory Visit: Payer: Self-pay

## 2021-09-02 ENCOUNTER — Other Ambulatory Visit (HOSPITAL_COMMUNITY)
Admission: RE | Admit: 2021-09-02 | Discharge: 2021-09-02 | Disposition: A | Discharge status: Home / Self Care | Payer: Medicaid Other | Source: Ambulatory Visit | Attending: Infectious Diseases | Admitting: Infectious Diseases

## 2021-09-02 ENCOUNTER — Other Ambulatory Visit: Payer: Medicaid Other

## 2021-09-02 DIAGNOSIS — Z79899 Other long term (current) drug therapy: Secondary | ICD-10-CM

## 2021-09-02 DIAGNOSIS — Z23 Encounter for immunization: Secondary | ICD-10-CM | POA: Diagnosis not present

## 2021-09-02 MED ORDER — CABOTEGRAVIR ER 600 MG/3ML IM SUER
600.0000 mg | Freq: Once | INTRAMUSCULAR | Status: AC
  Administered 2021-09-02: 600 mg via INTRAMUSCULAR

## 2021-09-02 NOTE — Progress Notes (Signed)
HPI: Dominique [name] is a 46 y.o. female who presents to the [facility] pharmacy clinic for Apretude administration and HIV PrEP follow up.  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   CAPSAICIN (ZOSTRIX) 0.025 % CREAM    Apply 1 application. topically 3 (three) times daily.   CETIRIZINE (ZYRTEC) 10 MG TABLET    Take 10 mg by mouth daily.   CLOPIDOGREL (PLAVIX) 75 MG TABLET    Take 0.5 tablets (37.5 mg total) by mouth daily.   DEPO-SUBQ PROVERA 104 104 MG/0.65ML INJECTION    every 3 (three) months.   DICLOFENAC SODIUM (VOLTAREN) 1 % GEL    Apply 2 g topically 4 (four) times daily.   FOLIC ACID (FOLVITE) 1 MG TABLET    Take 1 tablet (1 mg total) by mouth daily.   GABAPENTIN (NEURONTIN) 600 MG TABLET    Take 600 mg by mouth 3 (three) times daily.   HYDROXYCHLOROQUINE (PLAQUENIL) 200 MG TABLET    TAKE 1 TABLET(200 MG) BY MOUTH DAILY   MELOXICAM (MOBIC) 15 MG TABLET    Take 1 tablet (15 mg total) by mouth daily.    QUETIAPINE (SEROQUEL) 100 MG TABLET    Take 100 mg by mouth daily.   SULFASALAZINE (AZULFIDINE) 500 MG TABLET    Take as directed increase 1 tablet per day weekly, up till 2 tablets twice daily   VITAMIN D, ERGOCALCIFEROL, (DRISDOL) 1.25 MG (50000 UNIT) CAPS CAPSULE    Take 50,000 Units by mouth once a week.  Modified Medications   No medications on file  Discontinued Medications   No medications on file    Allergies: No Known Allergies  Past Medical History: Past Medical History:  Diagnosis Date   Aneurysm (HCC)    Arthritis    RA   Kidney stones    Lupus (HCC) 01/17/2021   Schizophrenia (HCC)    Systemic lupus erythematosus (HCC)     Social History: Social History   Socioeconomic History   Marital status: Single    Spouse name: Not on file   Number of children: Not on file   Years of education: Not on file   Highest education level: Not on file  Occupational History   Not on file  Tobacco Use   Smoking status: Former    Packs/day: 0.25    Years: 15.00    Pack years: 3.75    Types: Cigarettes    Quit date: 07/15/2021    Years since quitting: 0.1   Smokeless tobacco: Never  Vaping Use   Vaping Use: Never used  Substance and Sexual Activity   Alcohol use: Not Currently   Drug use: Yes    Types: Marijuana    Comment: Last use was on 07/15/21   Sexual activity: Yes    Partners: Male    Birth control/protection: Surgical, Injection  Other Topics Concern   Not on file  Social History Narrative   Right handed   Social Determinants of Health   Financial Resource Strain: Not on file  Food Insecurity: Not on file  Transportation Needs: Not on file  Physical Activity: Not on file  Stress: Not on file  Social Connections: Not on file    Labs: Lab Results  Component Value Date   HIV1RNAQUANT Not Detected 06/25/2021   HIV1RNAQUANT Not Detected 05/02/2021   HIV1RNAQUANT Not Detected 03/07/2021    RPR and STI Lab Results  Component Value Date   LABRPR  REACTIVE (A) 06/25/2021   LABRPR REACTIVE (A) 12/18/2020   LABRPR Non Reactive 10/16/2019   RPRTITER 1:8 (H) 06/25/2021   RPRTITER 1:16 (H) 12/18/2020    STI Results GC GC CT CT  06/25/2021  8:33 AM Negative    Negative     01/30/2021 10:14 AM Negative    Negative     10/16/2019  3:39 PM Negative    Negative     05/09/2013 10:57 AM  NEGATIVE    NEGATIVE    12/07/2011 12:20 PM   NEGATIVE       Hepatitis B Lab Results  Component Value Date   HEPBSAB NON-REACTIVE 12/18/2020   HEPBSAG NON-REACTIVE 12/18/2020   HEPBCAB NON-REACTIVE 12/18/2020   Hepatitis C Lab Results  Component Value Date   HEPCAB NON-REACTIVE 12/18/2020   Hepatitis A No results found for: HAV Lipids: Lab Results  Component Value Date   CHOL 154 07/28/2019   TRIG 96 07/28/2019   HDL 57 07/28/2019   CHOLHDL 2.7 07/28/2019   LDLCALC 79 07/28/2019    TARGET DATE: The 3rd of the month  Current PrEP Regimen: Apretude  Assessment: Dominique [name] presents today for their Apretude injection and to follow up for HIV PrEP. No issues with past injections. No known exposures to any STIs and no signs or symptoms of any STIs today. Last STI screening was 06/25/21 and was negative. Screened patient for acute HIV symptoms such as fatigue, muscle aches, rash, sore throat, lymphadenopathy, headache, night sweats, nausea/vomiting/diarrhea, and fever. Patient denies any symptoms. No new partners since last injection. Rapid HIV blood test was drawn immediately prior to injection and was negative. Patient also had HIV RNA drawn today.   Administered cabotegravir 600mg /393mL in right upper outer quadrant of the gluteal muscle. Will make follow up appointments for maintenance injections every 2 months.   Also administered second Hepatitis B Vaccine today. Monitored post-vaccination, no issues or concerns.   Plan: - Check HIV RNA                                                           - Maintenance injections scheduled for 10/30/21,  12/30/21 - Call with any issues or questions  Shirlee MoreAlexandria Emmalena Canny, PharmD PGY2 Infectious Diseases Pharmacy Resident

## 2021-09-03 LAB — URINE CYTOLOGY ANCILLARY ONLY
Chlamydia: NEGATIVE
Comment: NEGATIVE
Comment: NORMAL
Neisseria Gonorrhea: NEGATIVE

## 2021-09-06 LAB — HIV-1 RNA QUANT-NO REFLEX-BLD
HIV 1 RNA Quant: NOT DETECTED Copies/mL
HIV-1 RNA Quant, Log: NOT DETECTED Log cps/mL

## 2021-09-08 ENCOUNTER — Ambulatory Visit: Payer: Medicaid Other | Admitting: Internal Medicine

## 2021-09-08 DIAGNOSIS — M359 Systemic involvement of connective tissue, unspecified: Secondary | ICD-10-CM

## 2021-09-08 DIAGNOSIS — A523 Neurosyphilis, unspecified: Secondary | ICD-10-CM

## 2021-09-08 DIAGNOSIS — Z79899 Other long term (current) drug therapy: Secondary | ICD-10-CM

## 2021-09-08 DIAGNOSIS — M2550 Pain in unspecified joint: Secondary | ICD-10-CM

## 2021-09-12 ENCOUNTER — Ambulatory Visit (INDEPENDENT_AMBULATORY_CARE_PROVIDER_SITE_OTHER): Payer: Medicaid Other | Admitting: Internal Medicine

## 2021-09-12 ENCOUNTER — Encounter: Payer: Self-pay | Admitting: Internal Medicine

## 2021-09-12 VITALS — BP 107/67 | HR 82 | Resp 15 | Ht 60.0 in | Wt 120.0 lb

## 2021-09-12 DIAGNOSIS — Z79899 Other long term (current) drug therapy: Secondary | ICD-10-CM

## 2021-09-12 DIAGNOSIS — R21 Rash and other nonspecific skin eruption: Secondary | ICD-10-CM

## 2021-09-12 DIAGNOSIS — G8929 Other chronic pain: Secondary | ICD-10-CM

## 2021-09-12 DIAGNOSIS — M359 Systemic involvement of connective tissue, unspecified: Secondary | ICD-10-CM

## 2021-09-12 DIAGNOSIS — M25571 Pain in right ankle and joints of right foot: Secondary | ICD-10-CM | POA: Diagnosis not present

## 2021-09-12 DIAGNOSIS — A523 Neurosyphilis, unspecified: Secondary | ICD-10-CM | POA: Diagnosis not present

## 2021-09-12 DIAGNOSIS — M25572 Pain in left ankle and joints of left foot: Secondary | ICD-10-CM

## 2021-09-12 NOTE — Progress Notes (Signed)
Office Visit Note  Patient: Dominique [name]             Date of Birth: 11/29/1975           MRN: [id number]             PCP: Joycelyn Man, FNP Referring: Joycelyn Man, FNP Visit Date: 09/12/2021   Subjective:  Lupus (Doing good)   History of Present Illness: Dominique [name] is a 46 y.o. 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 She feels symptoms partially improved with less knee pain but continues to have every day pain in bilateral feet and ankles. Pain is first thing in the morning as well as worsening if she is on her feet all day. Feet swelling is noticeable with prolonged standing worse later in the day. She had coil embolization procedure uneventfully. She has noticed some progression of facial rash.  Previous HPI 07/21/21 Dominique [name] is a 46 y.o. 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 So far she still has about the same amount of pain worst affected areas in both heels and in mid feet. Plantar fasciitis exercises mildly helpful for the bottom of the feet but not much. 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    Previous HPI 05/20/21 Dominique [name] is a 46 y.o. 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 So far her symptoms are about the same. She continues having pain worst in her feet and less extent along medial side of knees. She does not see much swelling or discoloration just pain. Has not noticed any side effects of the methotrexate.   Previous HPI 01/23/21 Dominique [name] is a 47 y.o. female here for evaluation of positive ANA and elevated sedimentation rate with multiple systemic symptoms including weight loss, lymphadenopathy, paresthesias, joint pain, and headaches.  She started noticing  hyperpigmented skin rashes developing on her face and extremities since several months ago does not recall specific onset or provoking episode.  Then developed worsening joint pain in multiple sites but started around July of this year.  This involved pain affecting both hands also the hips knees and feet bilaterally.  She described morning stiffness lasting 2 or 3 hours in duration also especially pain in the bottoms of the feet first and getting out of bed.  After a month with these ongoing and somewhat worsening symptoms she was seen in primary care clinic. Workup at that time positive for trichomoniasis and syphilis and starting antibiotics also found to have high sedimentation rate with positive ANA and CCP Abs. She followed up for continued treatment last month and more recently in ID clinic with completion of antibiotic treatment for syphilis with Bicillin.  Neurology evaluation with lumbar puncture that was not suggestive for active neurosyphilis. She saw ophthalmology for visual change symptoms reports findings were not concerning for infectious or inflammatory type changes.     Review of Systems  Constitutional:  Positive for fatigue.  HENT:  Positive for mouth dryness.   Eyes:  Negative for dryness.  Respiratory:  Negative for shortness of breath.   Cardiovascular:  Positive for swelling in legs/feet.  Gastrointestinal:  Negative for constipation.  Endocrine: Positive for  cold intolerance.  Genitourinary:  Negative for difficulty urinating.  Musculoskeletal:  Positive for joint pain, gait problem, joint pain, joint swelling, muscle weakness, morning stiffness and muscle tenderness.  Skin:  Negative for rash.  Allergic/Immunologic: Negative for susceptible to infections.  Neurological:  Positive for numbness and weakness.  Hematological:  Positive for bruising/bleeding tendency.  Psychiatric/Behavioral:  Positive for sleep disturbance.     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   Right hip pain 05/16/2020   BRBPR (bright red blood per rectum) 10/18/2019   Tobacco use disorder 07/31/2019   Dyspnea, paroxysmal nocturnal 07/31/2019   Schizophrenia ([place]) 07/31/2019   Encounter for screening examination for sexually transmitted disease 07/31/2019   Abnormal uterine bleeding (AUB) 05/09/2013   BV (bacterial vaginosis) 05/09/2013   Dysmenorrhea 05/09/2013    Past Medical History:  Diagnosis Date   Aneurysm ([place])    Arthritis    RA   Kidney stones    Lupus (Dominique [name]) 01/17/2021   Schizophrenia ([place])    Systemic lupus erythematosus ([place])     Family History  Problem Relation Age of Onset   Stroke Mother    Multiple sclerosis Father    Hypertension Father    Diabetes Father    Asthma Brother    Dementia Maternal Grandfather    Past Surgical History:  Procedure Laterality Date   CESAREAN SECTION     HERNIA REPAIR     IR 3D INDEPENDENT WKST  07/28/2021   IR ANGIO INTRA EXTRACRAN SEL INTERNAL CAROTID BILAT MOD SED  07/28/2021   IR ANGIO VERTEBRAL SEL VERTEBRAL UNI L MOD SED  07/28/2021   IR ANGIOGRAM FOLLOW UP STUDY  07/28/2021   IR CT HEAD LTD  07/28/2021   IR NEURO EACH ADD'L AFTER BASIC UNI LEFT (MS)  07/28/2021   IR RADIOLOGIST EVAL & MGMT  07/10/2021   IR RADIOLOGIST EVAL & MGMT  08/15/2021   IR TRANSCATH/EMBOLIZ  07/28/2021   IR US GUIDE VASC ACCESS RIGHT  07/28/2021   RADIOLOGY WITH ANESTHESIA N/A 07/28/2021   Procedure: EMBOLIZATION;  Surgeon: Luanne Bras, MD;  Location: [facility];  Service: Radiology;  Laterality: N/A;   Social History   Social History  Narrative   Right handed   Immunization History  Administered Date(s) Administered   Hepb-cpg 06/25/2021, 09/02/2021   Influenza,inj,Quad PF,6+ Mos 01/30/2021     Objective: Vital Signs: BP 107/67 (BP Location: Right Arm, Patient Position: Sitting, Cuff Size: Normal)   Pulse 82   Resp 15   Ht 5' (1.524 m)   Wt 120 lb (54.4 kg)   BMI 23.44 kg/m    Physical Exam HENT:     Mouth/Throat:     Mouth: Mucous membranes are moist.     Pharynx: Oropharynx is clear.  Eyes:     Conjunctiva/sclera: Conjunctivae normal.  Cardiovascular:     Rate and Rhythm: Normal rate and regular rhythm.  Pulmonary:     Effort: Pulmonary effort is normal.     Breath sounds: Normal breath sounds.  Musculoskeletal:     Right lower leg: No edema.  Left lower leg: No edema.  Skin:    General: Skin is warm and dry.     Comments: Facial rash with hyperpigmentation in peripheral distribution, no papules no erythema no induration  Neurological:     Mental Status: She is alert.  Psychiatric:        Mood and Affect: Mood normal.      Musculoskeletal Exam:  Elbows full ROM no tenderness or swelling Wrists full ROM no tenderness or swelling Fingers full ROM no tenderness or swelling Knees full ROM no tenderness or swelling Ankles full ROM tenderness to pressure on achilles tendon and on bottom of calcaneus and bottom of foot arch, no appreciable swelling Bilateral pes planus MTPs full ROM no tenderness or swelling  Investigation: No additional findings.  Imaging: IR Radiologist Eval & Mgmt  Result Date: 08/15/2021 EXAM: ESTABLISHED PATIENT OFFICE VISIT CHIEF COMPLAINT: Intermittent headaches. 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  Patient was then discharged the following day under the care of her mother. Patient returns today for follow-up accompanied by her mother. Clinically, the patient reports symptoms of supraorbital to bifrontal headaches 2-3 days on returning home. She describes these headaches being sharp in character lasting about 2-3 minutes without any associated symptoms of nausea, vomiting or vision difficulties. However, on close questioning she reports she sees a blind spot in the left visual field which dissipates. The patient reports relief with Tylenol which lasts for about 4-6 hours. She may have more than 1 headache a day. Patient is able to continue with her daily routine activities, otherwise. She reports no episodes of loss of awareness or of seizures. She denies any symptoms of chest pain, shortness of breath or palpitations. She denies any cough, wheezing or sputum production. Denies any difficulty with swallowing liquids or of chewing or in swallowing solids. Denies any abdominal pains, constipation, diarrhea or melena. She denies any symptoms of dysuria, hematuria or of polyuria. She denies any recent chills, fever or rigors. Past Medical History: Unchanged. Medications: cetirizine 10 MG tablet commonly known as: ZYRTEC take 10 mg by mouth daily. clopidogrel 75 MG tablet commonly known as: Plavix take 1 tablet (75 mg total) by mouth daily for 14 days. Depo-SubQ Provera 104 104 MG/0.65ML injection generic drug: Medroxy PROGESTERone every 3 (three) months. diclofenac Sodium 1 % Gel commonly known as: Voltaren apply 2 g topically 4 (four) times daily. folic acid 1 MG tablet commonly known as: FOLVITE take 1 tablet (1 mg total) by mouth daily. gabapentin 600 MG tablet commonly known as: NEURONTIN take 600 mg by mouth 3 (three) times daily. hydroxychloroquine 200 MG tablet commonly known as: PLAQUENIL TAKE 1 TABLET(200 MG) BY MOUTH DAILY meloxicam 15 MG tablet commonly known as: MOBIC take 1 tablet (15 mg total) by  mouth daily. QUEtiapine 100 MG tablet commonly known as: SEROQUEL take 100 mg by mouth daily. Vitamin D (Ergocalciferol) 1.25 MG (50000 UNIT) Caps capsule commonly known as: DRISDOL take 50,000 Units by mouth once a week. Diagnosis * : Date . * : Aneurysm ([place]) * : . * : Arthritis * : * : RA . * : Kidney stones * : . * : Lupus ([place]) * : 01/17/2021 . * : Schizophrenia ([place]) * : . * : Systemic lupus erythematosus ([place]) * :  Past Surgical History: Procedure * : Laterality * : Date . * : CESAREAN SECTION * : * : . * : HERNIA REPAIR * : * : . * : IR RADIOLOGIST EVAL & MGMT Allergies: None known. Social History: Denies use of alcohol or of cigarettes or illicit chemicals Family History:  As noted previously. REVIEW OF SYSTEMS: Negative unless as mentioned above. PHYSICAL EXAMINATION: Appears in no acute distress. Awake, alert, oriented to time, place, space. Appropriate affect. Normal responses. No abnormal lateralizing neurologic features noted. Station and gait normal. ASSESSMENT AND PLAN: Advised patient to maintain adequate hydration and to continue with aspirin 81 mg a day and Plavix 37.5 mg a day for 2 weeks. Thereafter, stop the Plavix and continue with 325 mg of aspirin per day. Follow-up MRI of the brain and MRA of the brain in late August/early September 2023. Patient was also advised to connect with her rheumatologist and primary care for her medical issues. Patient also advised to call in approximately 2 weeks regarding her headaches. Patient and mother expressed understanding and agreement with the above management plan. Electronically Signed   By: Luanne Bras M.D.   On: 08/15/2021 08:29    Recent Labs: Lab Results  Component Value Date   WBC 8.7 07/29/2021   HGB 9.3 (L) 07/29/2021   PLT 178 07/29/2021   NA 139 07/28/2021   K 3.4 (L) 07/28/2021   CL 110 07/28/2021   CO2 22 07/28/2021   GLUCOSE 104 (H) 07/28/2021   BUN 5 (L) 07/28/2021   CREATININE 0.70 07/28/2021   BILITOT 0.6 05/20/2021    ALKPHOS 71 02/27/2021   AST 21 05/20/2021   ALT 12 05/20/2021   PROT 7.3 05/20/2021   ALBUMIN 4.4 02/27/2021   CALCIUM 8.8 (L) 07/28/2021   GFRAA 117 05/20/2020    Speciality Comments: LMOM for Dr. Zenia Resides office to fax PLQ eye exam 06/18/2021 shc  Procedures:  No procedures performed Allergies: Patient has no known allergies.   Assessment / Plan:     Visit Diagnoses: Undifferentiated connective tissue disease ([place]) - Plan: Sedimentation rate  Symptoms are partially improved decrease in knee pain and stiffness since starting SSZ. I have some concern about possible contribution to hyperpigmented rash from HCQ treatment. Rash predates the medication but could be contributory. Checking ESR. Plan to continue SSZ 1000 mg BID and stop HCQ.  Chronic pain of both ankles  Bilateral ankle pains not much objective inflammation present on exam today.  May be some more of enthesitis process with tenderness around the Achilles more so than malleolus and ankle joint mobility.  Neurosyphilis in adult  High risk medication use - Plan: CBC with Differential/Platelet, COMPLETE METABOLIC PANEL WITH GFR  CBC and CMP for sulfasalazine medication monitoring.  Rash and other nonspecific skin eruption  Skin changes appear stable.  No significant improvement after stopping the hydroxychloroquine but also no new progression so unclear if directly related.  Orders: Orders Placed This Encounter  Procedures   Sedimentation rate   CBC with Differential/Platelet   COMPLETE METABOLIC PANEL WITH GFR   No orders of the defined types were placed in this encounter.    Follow-Up Instructions: Return in about 3 months (around 12/13/2021).   Collier Salina, MD  Note - This record has been created using [employer].  Chart creation errors have been sought, but may not always  have been located. Such creation errors do not reflect on  the standard of medical care.

## 2021-09-13 LAB — CBC WITH DIFFERENTIAL/PLATELET
Absolute Monocytes: 360 cells/uL (ref 200–950)
Basophils Absolute: 12 cells/uL (ref 0–200)
Basophils Relative: 0.2 %
Eosinophils Absolute: 18 cells/uL (ref 15–500)
Eosinophils Relative: 0.3 %
HCT: 30.6 % — ABNORMAL LOW (ref 35.0–45.0)
Hemoglobin: 10.2 g/dL — ABNORMAL LOW (ref 11.7–15.5)
Lymphs Abs: 2472 cells/uL (ref 850–3900)
MCH: 32.3 pg (ref 27.0–33.0)
MCHC: 33.3 g/dL (ref 32.0–36.0)
MCV: 96.8 fL (ref 80.0–100.0)
MPV: 10.4 fL (ref 7.5–12.5)
Monocytes Relative: 6.1 %
Neutro Abs: 3039 cells/uL (ref 1500–7800)
Neutrophils Relative %: 51.5 %
Platelets: 189 10*3/uL (ref 140–400)
RBC: 3.16 10*6/uL — ABNORMAL LOW (ref 3.80–5.10)
RDW: 13.2 % (ref 11.0–15.0)
Total Lymphocyte: 41.9 %
WBC: 5.9 10*3/uL (ref 3.8–10.8)

## 2021-09-13 LAB — COMPLETE METABOLIC PANEL WITH GFR
AG Ratio: 1.7 (calc) (ref 1.0–2.5)
ALT: 15 U/L (ref 6–29)
AST: 16 U/L (ref 10–35)
Albumin: 4.3 g/dL (ref 3.6–5.1)
Alkaline phosphatase (APISO): 63 U/L (ref 31–125)
BUN: 9 mg/dL (ref 7–25)
CO2: 25 mmol/L (ref 20–32)
Calcium: 9.3 mg/dL (ref 8.6–10.2)
Chloride: 109 mmol/L (ref 98–110)
Creat: 0.68 mg/dL (ref 0.50–0.99)
Globulin: 2.5 g/dL (calc) (ref 1.9–3.7)
Glucose, Bld: 86 mg/dL (ref 65–99)
Potassium: 3.9 mmol/L (ref 3.5–5.3)
Sodium: 140 mmol/L (ref 135–146)
Total Bilirubin: 0.3 mg/dL (ref 0.2–1.2)
Total Protein: 6.8 g/dL (ref 6.1–8.1)
eGFR: 109 mL/min/{1.73_m2} (ref >=60)

## 2021-09-13 LAB — SEDIMENTATION RATE: Sed Rate: 6 mm/h (ref 0–20)

## 2021-09-14 NOTE — Progress Notes (Signed)
Lab result looks fine on the sulfasalazine. Her blood count is slightly improved. Her inflammatory markers are improved.

## 2021-09-16 ENCOUNTER — Telehealth (HOSPITAL_COMMUNITY): Payer: Self-pay

## 2021-09-16 ENCOUNTER — Telehealth: Payer: Self-pay | Admitting: Student

## 2021-09-16 NOTE — Telephone Encounter (Signed)
Pt called to give an update on her headaches. I have sent a message to our PA, Asher Muir to discuss with Dr. Corliss Skains and advise. AW

## 2021-09-16 NOTE — Telephone Encounter (Signed)
Patient called IR to give an update on her headaches. She states her headaches have gotten less severe but that that cold weather/temperatures cause a severe pain. The headache/pain improves when she is warm. This information was discussed with Dr. Corliss Skains who does not feel this symptom is concerning. He encourages her to stay in warm temperatures and/or dress warmly. The patient verbalized understanding of this information and has no further questions/concerns.  Alwyn Ren, [place] [phone] 09/16/2021, 4:09 PM

## 2021-09-23 ENCOUNTER — Other Ambulatory Visit: Payer: Self-pay | Admitting: Internal Medicine

## 2021-09-23 DIAGNOSIS — M359 Systemic involvement of connective tissue, unspecified: Secondary | ICD-10-CM

## 2021-09-23 NOTE — Telephone Encounter (Signed)
Next Visit: 12/10/2021  Last Visit: 09/12/2021  Labs: 09/12/2021 Lab result looks fine on the sulfasalazine. Her blood count is slightly improved  Current Dose per office note 09/12/2021: continue SSZ 1000 mg BID and stop HCQ.  NF:[id number] connective tissue disease  Last Fill: 07/31/2021  Okay to refill Plaquenil?

## 2021-09-24 MED ORDER — SULFASALAZINE 500 MG PO TABS: 1000.0000 mg | ORAL_TABLET | Freq: Two times a day (BID) | ORAL | 2 refills | Status: DC

## 2021-09-24 NOTE — Telephone Encounter (Signed)
Recommended her to discontinue the plaquenil and just continue SSZ due to concern HCQ may be contributing to skin pigmentation. Okay for refilling SSZ.

## 2021-10-06 ENCOUNTER — Other Ambulatory Visit: Payer: Self-pay | Admitting: Internal Medicine

## 2021-10-06 DIAGNOSIS — M359 Systemic involvement of connective tissue, unspecified: Secondary | ICD-10-CM

## 2021-10-12 ENCOUNTER — Other Ambulatory Visit: Payer: Self-pay

## 2021-10-12 ENCOUNTER — Emergency Department (HOSPITAL_COMMUNITY)
Admission: EM | Admit: 2021-10-12 | Discharge: 2021-10-13 | Disposition: A | Discharge status: Home / Self Care | Payer: Medicaid Other | Attending: Emergency Medicine | Admitting: Emergency Medicine

## 2021-10-12 ENCOUNTER — Emergency Department (HOSPITAL_COMMUNITY): Payer: Medicaid Other

## 2021-10-12 ENCOUNTER — Encounter (HOSPITAL_COMMUNITY): Payer: Self-pay | Admitting: Emergency Medicine

## 2021-10-12 DIAGNOSIS — R1031 Right lower quadrant pain: Secondary | ICD-10-CM | POA: Diagnosis present

## 2021-10-12 DIAGNOSIS — N3001 Acute cystitis with hematuria: Secondary | ICD-10-CM

## 2021-10-12 DIAGNOSIS — R197 Diarrhea, unspecified: Secondary | ICD-10-CM | POA: Diagnosis not present

## 2021-10-12 DIAGNOSIS — R112 Nausea with vomiting, unspecified: Secondary | ICD-10-CM

## 2021-10-12 DIAGNOSIS — K529 Noninfective gastroenteritis and colitis, unspecified: Secondary | ICD-10-CM

## 2021-10-12 DIAGNOSIS — E876 Hypokalemia: Secondary | ICD-10-CM | POA: Diagnosis not present

## 2021-10-12 LAB — COMPREHENSIVE METABOLIC PANEL
ALT: 17 U/L (ref 0–44)
AST: 19 U/L (ref 15–41)
Albumin: 4.1 g/dL (ref 3.5–5.0)
Alkaline Phosphatase: 66 U/L (ref 38–126)
Anion gap: 7 (ref 5–15)
BUN: 8 mg/dL (ref 6–20)
CO2: 22 mmol/L (ref 22–32)
Calcium: 9.5 mg/dL (ref 8.9–10.3)
Chloride: 110 mmol/L (ref 98–111)
Creatinine, Ser: 0.7 mg/dL (ref 0.44–1.00)
GFR, Estimated: >60 mL/min (ref >60)
Glucose, Bld: 98 mg/dL (ref 70–99)
Potassium: 3.1 mmol/L — ABNORMAL LOW (ref 3.5–5.1)
Sodium: 139 mmol/L (ref 135–145)
Total Bilirubin: 0.7 mg/dL (ref 0.3–1.2)
Total Protein: 7.2 g/dL (ref 6.5–8.1)

## 2021-10-12 LAB — CBC WITH DIFFERENTIAL/PLATELET
Abs Immature Granulocytes: 0.01 10*3/uL (ref 0.00–0.07)
Basophils Absolute: 0 10*3/uL (ref 0.0–0.1)
Basophils Relative: 0 %
Eosinophils Absolute: 0 10*3/uL (ref 0.0–0.5)
Eosinophils Relative: 0 %
HCT: 31.5 % — ABNORMAL LOW (ref 36.0–46.0)
Hemoglobin: 10.6 g/dL — ABNORMAL LOW (ref 12.0–15.0)
Immature Granulocytes: 0 %
Lymphocytes Relative: 42 %
Lymphs Abs: 2.5 10*3/uL (ref 0.7–4.0)
MCH: 31.5 pg (ref 26.0–34.0)
MCHC: 33.7 g/dL (ref 30.0–36.0)
MCV: 93.8 fL (ref 80.0–100.0)
Monocytes Absolute: 0.4 10*3/uL (ref 0.1–1.0)
Monocytes Relative: 6 %
Neutro Abs: 3.1 10*3/uL (ref 1.7–7.7)
Neutrophils Relative %: 52 %
Platelets: 209 10*3/uL (ref 150–400)
RBC: 3.36 MIL/uL — ABNORMAL LOW (ref 3.87–5.11)
RDW: 13.9 % (ref 11.5–15.5)
WBC: 6 10*3/uL (ref 4.0–10.5)
nRBC: 0 % (ref 0.0–0.2)

## 2021-10-12 LAB — I-STAT BETA HCG BLOOD, ED (MC, WL, AP ONLY): I-stat hCG, quantitative: <5 m[IU]/mL (ref <5)

## 2021-10-12 LAB — LIPASE, BLOOD: Lipase: 47 U/L (ref 11–51)

## 2021-10-12 MED ORDER — POTASSIUM CHLORIDE CRYS ER 20 MEQ PO TBCR
40.0000 meq | EXTENDED_RELEASE_TABLET | Freq: Once | ORAL | Status: AC
  Administered 2021-10-13: 40 meq via ORAL
  Filled 2021-10-12: qty 2

## 2021-10-12 MED ORDER — HYDROMORPHONE HCL 1 MG/ML IJ SOLN
0.5000 mg | Freq: Once | INTRAMUSCULAR | Status: AC
  Administered 2021-10-12: 0.5 mg via INTRAVENOUS
  Filled 2021-10-12: qty 1

## 2021-10-12 MED ORDER — ONDANSETRON HCL 4 MG/2ML IJ SOLN
4.0000 mg | Freq: Once | INTRAMUSCULAR | Status: AC
Start: 2021-10-12 — End: 2021-10-12
  Administered 2021-10-12: 4 mg via INTRAVENOUS
  Filled 2021-10-12: qty 2

## 2021-10-12 MED ORDER — SODIUM CHLORIDE 0.9 % IV BOLUS
1000.0000 mL | Freq: Once | INTRAVENOUS | Status: AC
  Administered 2021-10-12: 1000 mL via INTRAVENOUS

## 2021-10-12 MED ORDER — IOHEXOL 300 MG/ML  SOLN
80.0000 mL | Freq: Once | INTRAMUSCULAR | Status: AC | PRN
  Administered 2021-10-12: 80 mL via INTRAVENOUS

## 2021-10-12 NOTE — ED Provider Notes (Signed)
[facility] EMERGENCY DEPARTMENT Provider Note   CSN: [id number] Arrival date & time: 10/12/21  1405     History  Chief Complaint  Patient presents with   Abdominal Pain    Dominique [name] is a 46 y.o. female.  Patient with hx of lupus currently on cabotegravir, sulfasalazine and hydroxychloroquine, denies previous history of abdominal surgeries-- presents to the ED for lower abdominal pain x2 days, diarrhea x1 day, vomiting starting today.  Diarrhea has been every several hours, and vomiting was also been every several hours. Says the pain is constant and more significant on the right lower quadrant. Has been having diarrhea, nausea, and vomitting, unable to keep food or fluids down for the past 2 days. Last BM a couple hours ago, normal stools w/o blood. Denies fevers or chills or sick contacts. Denies dysuria or hematuria. Denies possibility of pregnancy on depo shot, abnormal vaginal discharge or bleeding. Denies chest pain, shortness of breath, dyspnea, dizziness.       Home Medications Prior to Admission medications   Medication Sig Start Date End Date Taking? Authorizing Provider  cabotegravir ER (APRETUDE) 600 MG/3ML injection Inject 3 mLs (600 mg total) into the muscle every 2 (two) months. 08/15/21   Kuppelweiser, Cassie L, RPH-CPP  capsaicin (ZOSTRIX) 0.025 % cream Apply 1 application. topically 3 (three) times daily. 07/02/21   [provider]  cetirizine (ZYRTEC) 10 MG tablet Take 10 mg by mouth daily. 11/07/20   [provider]  clopidogrel (PLAVIX) 75 MG tablet Take 0.5 tablets (37.5 mg total) by mouth daily. Patient not taking: Reported on 09/12/2021 08/13/21   Hoyt Koch, PA  DEPO-SUBQ PROVERA 104 104 MG/0.65ML injection every 3 (three) months. 12/20/20   [provider]  diclofenac Sodium (VOLTAREN) 1 % GEL Apply 2 g topically 4 (four) times daily. 05/20/20   Sandre Kitty, MD  famotidine (PEPCID) 20 MG tablet  Take 20 mg by mouth at bedtime. 09/05/21   [provider]  folic acid (FOLVITE) 1 MG tablet Take 1 tablet (1 mg total) by mouth daily. 06/26/21   Rice, Jamesetta Orleans, MD  gabapentin (NEURONTIN) 600 MG tablet Take 600 mg by mouth 3 (three) times daily. 02/04/21   [provider]  hydroxychloroquine (PLAQUENIL) 200 MG tablet TAKE 1 TABLET(200 MG) BY MOUTH DAILY 06/19/21   Rice, Jamesetta Orleans, MD  melatonin 3 MG TABS tablet Take by mouth. 09/05/21 12/04/21  [provider]  meloxicam (MOBIC) 15 MG tablet Take 1 tablet (15 mg total) by mouth daily. 05/20/20   Sandre Kitty, MD  QUEtiapine (SEROQUEL) 100 MG tablet Take 100 mg by mouth daily.    [provider]  sulfaSALAzine (AZULFIDINE) 500 MG EC tablet Take 2 tablets (1,000 mg total) by mouth 2 (two) times daily. 10/07/21   Rice, Jamesetta Orleans, MD  Vitamin D, Ergocalciferol, (DRISDOL) 1.25 MG (50000 UNIT) CAPS capsule Take 50,000 Units by mouth once a week. 07/07/21   [provider]      Allergies    Patient has no known allergies.    Review of Systems   Review of Systems  Physical Exam Updated Vital Signs BP 134/69   Pulse 62   Temp 99.7 F (37.6 C) (Oral)   Resp 16   SpO2 100%  Physical Exam Vitals and nursing note reviewed.  Constitutional:      General: She is in acute distress (appears uncomfortable).     Appearance: She is well-developed.  HENT:  Head: Normocephalic and atraumatic.     Right Ear: External ear normal.     Left Ear: External ear normal.     Nose: Nose normal.  Eyes:     Conjunctiva/sclera: Conjunctivae normal.  Cardiovascular:     Rate and Rhythm: Normal rate and regular rhythm.     Heart sounds: No murmur heard. Pulmonary:     Effort: No respiratory distress.     Breath sounds: No wheezing, rhonchi or rales.  Abdominal:     Palpations: Abdomen is soft.     Tenderness: There is abdominal tenderness in the right lower quadrant. There is guarding. There is no  rebound. Negative signs include Murphy's sign and McBurney's sign.  Musculoskeletal:     Cervical back: Normal range of motion and neck supple.     Right lower leg: No edema.     Left lower leg: No edema.  Skin:    General: Skin is warm and dry.     Findings: No rash.  Neurological:     General: No focal deficit present.     Mental Status: She is alert. Mental status is at baseline.     Motor: No weakness.  Psychiatric:        Mood and Affect: Mood normal.    ED Results / Procedures / Treatments   Labs (all labs ordered are listed, but only abnormal results are displayed) Labs Reviewed  COMPREHENSIVE METABOLIC PANEL - Abnormal; Notable for the following components:      Result Value   Potassium 3.1 (*)    All other components within normal limits  CBC WITH DIFFERENTIAL/PLATELET - Abnormal; Notable for the following components:   RBC 3.36 (*)    Hemoglobin 10.6 (*)    HCT 31.5 (*)    All other components within normal limits  LIPASE, BLOOD  URINALYSIS, ROUTINE W REFLEX MICROSCOPIC  I-STAT BETA HCG BLOOD, ED ([facility], [facility], [facility] ONLY)    EKG None  Radiology DG Abdomen Acute W/Chest  Result Date: 10/12/2021 CLINICAL DATA:  Diffuse abdominal pain. The patient vomited 4 times today. Diarrhea. EXAM: DG ABDOMEN ACUTE WITH 1 VIEW CHEST COMPARISON:  None Available. FINDINGS: Calcified nodules identified in the right mid lung. The heart, hila, mediastinum, lungs, and pleura are otherwise unremarkable. No pneumothorax. No free air, portal venous gas, or pneumatosis. No bowel obstruction. No renal or ureteral stones identified. No other acute abnormalities are identified. IMPRESSION: No acute abnormalities. No cause for the patient's symptoms identified. Electronically Signed   By: Gerome Sam III M.D.   On: 10/12/2021 15:59    Procedures Procedures    Medications Ordered in ED Medications  HYDROmorphone (DILAUDID) injection 0.5 mg (has no administration in time range)  ondansetron  (ZOFRAN) injection 4 mg (has no administration in time range)  sodium chloride 0.9 % bolus 1,000 mL (has no administration in time range)    ED Course/ Medical Decision Making/ A&P    Patient seen and examined. History obtained directly from patient. Also reviewed external rheumatology notes.   Labs/EKG: Reviewed and personally interpreted lab work-up ordered in triage including CBC with normal white blood cell count, hemoglobin 10.6 otherwise unremarkable; CMP demonstrates hypokalemia at 3.1, normal kidney function, otherwise unremarkable; lipase normal; negative pregnancy.  Imaging: Personally reviewed and interpreted chest/abdominal x-rays, agree negative. No signs of obstructions.   Medications/Fluids: Ordered: IV dilaudid 0.5mg , IV zofran 4mg , IVF bolus.   Most recent vital signs reviewed and are as follows: BP 134/69   Pulse 62  Temp 99.7 F (37.6 C) (Oral)   Resp 16   SpO2 100%   Initial impression: RLQ abd pain, N/V/D.   11:44 PM pending UA and CT imaging.  Signout to [employer] at shift change.   Plan: Follow-up on CT imaging, will await UA results.  If CT negative, will need p.o. challenge, oral repletion of potassium.                            Medical Decision Making Amount and/or Complexity of Data Reviewed Radiology: ordered.  Risk Prescription drug management.   For this patient's complaint of abdominal pain, the following conditions were considered on the differential diagnosis: gastritis/PUD, enteritis/duodenitis, appendicitis, cholelithiasis/cholecystitis, cholangitis, pancreatitis, ruptured viscus, colitis, diverticulitis, small/large bowel obstruction, proctitis, cystitis, pyelonephritis, ureteral colic, aortic dissection, aortic aneurysm. In women, ectopic pregnancy, pelvic inflammatory disease, ovarian cysts, and tubo-ovarian abscess were also considered. Atypical chest etiologies were also considered including ACS, PE, and pneumonia.   The  patient's vital signs, pertinent lab work and imaging were reviewed and interpreted as discussed in the ED course. Hospitalization was considered for further testing, treatments, or serial exams/observation. However as patient is well-appearing, has a stable exam, and reassuring studies today, I do not feel that they warrant admission at this time. This plan was discussed with the patient who verbalizes agreement and comfort with this plan and seems reliable and able to return to the Emergency Department with worsening or changing symptoms.          Final Clinical Impression(s) / ED Diagnoses Final diagnoses:  RLQ abdominal pain  Nausea vomiting and diarrhea    Rx / DC Orders ED Discharge Orders     None         Renne Crigler, PA-C 10/12/21 2346    Wynetta Fines, MD 10/16/21 1446

## 2021-10-12 NOTE — ED Triage Notes (Signed)
C/o lower abd pain, nausea, vomiting, and diarrhea x 2 days.  Denies urinary complaints.  History of lupus.

## 2021-10-12 NOTE — ED Provider Triage Note (Signed)
Emergency Medicine Provider Triage Evaluation Note  Dominique [name] , a 46 y.o. female  was evaluated in triage.  Pt complains of abdominal pain.  She has vomited 4 times today, and had has 6-7 bouts of diarrhea today.   No known sick contacts.  No fevers.   No similar. She reports her entire abdomen hurts.     Physical Exam  BP 133/84 (BP Location: Right Arm)   Pulse 73   Temp 99.7 F (37.6 C) (Oral)   Resp 18   SpO2 99%  Gen:   Awake, tearful Resp:  Normal effort  MSK:   Moves extremities without difficulty  Other:  Normal speech  Medical Decision Making  Medically screening exam initiated at 2:33 PM.  Appropriate orders placed.  Dominique [name] was informed that the remainder of the evaluation will be completed by another provider, this initial triage assessment does not replace that evaluation, and the importance of remaining in the ED until their evaluation is complete.     Cristina Gong, [place] 10/12/21 1436

## 2021-10-13 ENCOUNTER — Other Ambulatory Visit (HOSPITAL_COMMUNITY): Payer: Self-pay

## 2021-10-13 LAB — URINALYSIS, ROUTINE W REFLEX MICROSCOPIC
Bilirubin Urine: NEGATIVE
Glucose, UA: NEGATIVE mg/dL
Ketones, ur: 80 mg/dL — AB
Nitrite: NEGATIVE
Protein, ur: 30 mg/dL — AB
RBC / HPF: >50 RBC/hpf — ABNORMAL HIGH (ref 0–5)
Specific Gravity, Urine: 1.03 (ref 1.005–1.030)
WBC, UA: >50 WBC/hpf — ABNORMAL HIGH (ref 0–5)
pH: 6 (ref 5.0–8.0)

## 2021-10-13 MED ORDER — METOCLOPRAMIDE HCL 5 MG/ML IJ SOLN
10.0000 mg | Freq: Once | INTRAMUSCULAR | Status: AC
  Administered 2021-10-13: 10 mg via INTRAVENOUS
  Filled 2021-10-13: qty 2

## 2021-10-13 MED ORDER — SULFAMETHOXAZOLE-TRIMETHOPRIM 800-160 MG PO TABS
1.0000 | ORAL_TABLET | Freq: Once | ORAL | Status: AC
  Administered 2021-10-13: 1 via ORAL
  Filled 2021-10-13: qty 1

## 2021-10-13 MED ORDER — SULFAMETHOXAZOLE-TRIMETHOPRIM 800-160 MG PO TABS: 1.0000 | ORAL_TABLET | Freq: Two times a day (BID) | ORAL | 0 refills | Status: AC

## 2021-10-13 NOTE — Progress Notes (Unsigned)
NEUROLOGY FOLLOW UP OFFICE NOTE  Dominique [name] [id number]  Assessment/Plan:   Generalized pain, dysesthesias, paresthesias Memory deficits Unclear etiology.  Not neurosyphilis.     1  Check serum B12, SPEP/IFE, ACE, CK, aldolase 2  NCV-EMG right upper and lower extremities 3  Follow up after testing.  Further recommendations pending results.     Subjective:  Dominique [name] is a 46 year old female with syphilis and SLE who follows up for paresthesias and memory deficits.    UPDATE: Labs from 03/11/2021 revealed CK 118, aldolase 2.2, ACE 25, negative SPEP/IFE and B12 241.  She was advised to start OTC daily.  NCV-EMG on 04/10/2021 was stopped prematurely due to pain but right median motor and right median, ulnar and mixed palmar sensory responses were normal.  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  She saw Dr. Corliss Skains of endovascular and  underwent embolization on 07/28/2021.  ***   HISTORY: In early 2022, she began experiencing sharp pain, numbness and swelling in the feet.  It subsequently started radiating up to her calves and knees.  Difficult to walk as it would cause painful pins and needles sensation.  She has pain in multiple joints.  She started experiencing numbness in the hands making it difficult to grasp objects.  Symptoms started getting worse in August.  She began feeling forgetful.  Blood work was positive for syphilis (RPR).  Given her symptoms, she underwent lumbar puncture to test for neuro-syphilis, which demonstrated a negative VDRL.  CT head on  12/23/2020 personally reviewed was negative. She has seen rheumatology.  No definitive diagnosis has been established.  She had also been experiencing headaches for the same amount of time, described as 9/10 diffuse pounding pain with dizziness and blurred vision (but no nausea, vomiting, photophobia, phonophobia, double vision) lasing 1 to several hours and occurring ***.  Treated with Tylenol.   Labs: 11/07/2020: ANA positive, weakly positive anti-CCP ab, negative RF, sed rate 56, T4 12.9, T3 Update 34%, free thyroxine index 4.4, Hgb A1c 5.3%, RPR reactive 1:32, D 25-hydroxy 25.8, Quantiferon-TB Gold Plus negative 12/18/2020:  Hepatitis B/C panel negative, HIV nefative 12/26/2020:  CSF with cell count 0, culture negative, protein 43, glucose 61, cytology negative, VDRL nonreactive 01/17/2021:  HIV negative 01/23/2021:  ds-DNA ab negative, Scl-70 ab negative, RNP ab negative, anti-Smith ab negative, C3 142, C4 28, sed rate 36 03/07/2021:  HIV negative  PAST MEDICAL HISTORY: Past Medical History:  Diagnosis Date   Aneurysm (HCC)    Arthritis    RA   Kidney stones    Lupus (HCC) 01/17/2021   Lupus (HCC)    Schizophrenia (HCC)    Systemic lupus erythematosus (HCC)     MEDICATIONS: ***   ALLERGIES: No Known Allergies  FAMILY HISTORY: Family History  Problem Relation Age of Onset   Stroke Mother    Multiple sclerosis Father    Hypertension Father    Diabetes Father    Asthma Brother    Dementia Maternal Grandfather       Objective:  *** General: No acute distress.  Patient appears well-groomed.   Head:  Normocephalic/atraumatic Eyes:  Fundi examined but not visualized Neck: supple, no paraspinal tenderness, full range of motion Heart:  Regular rate and rhythm Neurological Exam: alert and oriented to person, place, and time.  Speech fluent and not dysarthric, language intact.  CN II-XII intact. Bulk and tone normal, muscle strength 5/5 throughout.  Sensation to light touch  intact.  Deep tendon reflexes 2+ throughout, toes downgoing.  Finger to nose testing intact.  Gait normal, Romberg negative.   Shon Millet, DO  CC: Sherron Flemings, FNP

## 2021-10-13 NOTE — ED Provider Notes (Signed)
  Physical Exam  BP 119/63 (BP Location: Left Arm)   Pulse 62   Temp 98.4 F (36.9 C) (Oral)   Resp 16   SpO2 99%   Physical Exam Abdominal:     Palpations: Abdomen is soft.     Tenderness: There is generalized abdominal tenderness.     Hernia: No hernia is present.     Procedures  Procedures  ED Course / MDM    Medical Decision Making Amount and/or Complexity of Data Reviewed Radiology: ordered.  Risk Prescription drug management.   Care of this patient assumed from preceding ED provider Rhea Bleacher, PA-C at time of shift change.  Please see his associated note for further insight of the patient's ED course.  In brief patient is a 46 year old female with history of lupus on immunosuppressive therapy who presents with concern for 2 days of generalized lower abdominal pain with nausea vomiting and diarrhea without melena or hematochezia.  No vaginal discharge or bleeding, and no urinary symptoms.  Right lower quadrant tenderness upon the patient on preceding ED provider's exam .  CBC without leukocytosis but with mild anemia with hemoglobin of 10.6 near patient's baseline.  CMP with normal renal and liver function but mild hypokalemia 3.1, repleted orally.  Patient is not pregnant, lipase is normal, attempted to patient pending CT abdomen pelvis and UA.  Plan, the abdomen unremarkable.  Patient with both provider, CT of the abdomen pelvis also visualized with provider without acute intra-abdominal or pelvic abnormality to explain patient's symptoms.  Small right nephrolithiasis, nonobstructive.  Urine with large amount hemoglobin, ketonuria, proteinuria, large leukocytes, greater than 50 WBCs and few bacteria.  Suggestive of infection.  Antibiotics administered in the emergency department and will discharge with prescription for antibiotics as well.  Clinical picture most consistent with acute gastroenteritis likely viral in etiology with concomitant urinary tract infection which  will be treated with antibiotics.  Patient is otherwise hemodynamically stable, has received fluid bolus in the emergency department.  We will plan for discharge with close outpatient follow-up.  Dominique [name] and Dominique [name] family  voiced understanding of Dominique [name] medical evaluation and treatment plan. Dominique [name] of their questions answered to their expressed satisfaction.  Return precautions were given.  Patient is well-appearing, stable, and was discharged in good condition.  This chart was dictated using voice recognition software, Dragon. Despite the best efforts of this provider to proofread and correct errors, errors may still occur which can change documentation meaning.      Paris Lore, PA-C 10/13/21 0153    Gilda Crease, MD 10/13/21 [phone]

## 2021-10-13 NOTE — Discharge Instructions (Addendum)
You were seen in the ER today for your abdominal pain and nausea and diarrhea.  You likely are experiencing a viral illness causing a gastroenteritis, which is the name of the condition that causes nausea and vomiting and diarrhea.  Additionally you have a urinary tract infection for which you have been prescribed antibiotics.  Please take these as prescribed for the entire course and follow-up closely with your primary care doctor as well as rheumatologist.  Return to the ER with any severe symptoms.

## 2021-10-13 NOTE — ED Notes (Signed)
Pt has tolerated water well with no n/v noted

## 2021-10-14 ENCOUNTER — Other Ambulatory Visit (HOSPITAL_COMMUNITY): Payer: Self-pay

## 2021-10-14 ENCOUNTER — Encounter: Payer: Self-pay | Admitting: Neurology

## 2021-10-14 ENCOUNTER — Ambulatory Visit: Payer: Medicaid Other | Admitting: Neurology

## 2021-10-14 VITALS — BP 146/79 | HR 95 | Ht 60.0 in | Wt 120.4 lb

## 2021-10-14 DIAGNOSIS — R4189 Other symptoms and signs involving cognitive functions and awareness: Secondary | ICD-10-CM

## 2021-10-14 DIAGNOSIS — G43009 Migraine without aura, not intractable, without status migrainosus: Secondary | ICD-10-CM

## 2021-10-14 DIAGNOSIS — M329 Systemic lupus erythematosus, unspecified: Secondary | ICD-10-CM | POA: Diagnosis not present

## 2021-10-14 DIAGNOSIS — G894 Chronic pain syndrome: Secondary | ICD-10-CM | POA: Diagnosis not present

## 2021-10-14 DIAGNOSIS — G63 Polyneuropathy in diseases classified elsewhere: Secondary | ICD-10-CM

## 2021-10-14 DIAGNOSIS — I671 Cerebral aneurysm, nonruptured: Secondary | ICD-10-CM

## 2021-10-14 MED ORDER — NORTRIPTYLINE HCL 10 MG PO CAPS: 10.0000 mg | ORAL_CAPSULE | Freq: Every day | ORAL | 5 refills | Status: DC

## 2021-10-14 NOTE — Patient Instructions (Signed)
To treat headache (and may help with nerve pain), start nortriptyline 10mg  at bedtime. If no improvement in headaches in 4 weeks, contact me  Limit use of pain relievers to no more than 2 days out of week to prevent risk of rebound or medication-overuse headache.  Keep headache diary  Follow up with Dr. as recommended  Follow up with me in 4 months.

## 2021-10-16 ENCOUNTER — Telehealth: Payer: Self-pay

## 2021-10-16 NOTE — Telephone Encounter (Signed)
[facility] Patient Advocate Encounter  Patient's medication Dominique [name]) have been couriered to [facility] from [facility] [employer] [facility] and will be administered on the patient next office visit on 10/30/21.  Clearance Coots , CPhT Specialty Pharmacy Patient [facility] Phone: [phone] Fax:  [phone]

## 2021-10-27 ENCOUNTER — Other Ambulatory Visit: Payer: Self-pay | Admitting: Pharmacist

## 2021-10-27 DIAGNOSIS — Z79899 Other long term (current) drug therapy: Secondary | ICD-10-CM

## 2021-10-30 ENCOUNTER — Ambulatory Visit: Payer: Medicaid Other | Admitting: Pharmacist

## 2021-10-30 ENCOUNTER — Other Ambulatory Visit: Payer: Medicaid Other

## 2021-10-31 ENCOUNTER — Ambulatory Visit (INDEPENDENT_AMBULATORY_CARE_PROVIDER_SITE_OTHER): Payer: Medicaid Other | Admitting: Pharmacist

## 2021-10-31 ENCOUNTER — Other Ambulatory Visit (HOSPITAL_COMMUNITY)
Admission: RE | Admit: 2021-10-31 | Discharge: 2021-10-31 | Disposition: A | Discharge status: Home / Self Care | Payer: Medicaid Other | Source: Ambulatory Visit | Attending: Infectious Disease | Admitting: Infectious Disease

## 2021-10-31 ENCOUNTER — Other Ambulatory Visit: Payer: Medicaid Other

## 2021-10-31 ENCOUNTER — Other Ambulatory Visit: Payer: Self-pay

## 2021-10-31 DIAGNOSIS — Z79899 Other long term (current) drug therapy: Secondary | ICD-10-CM | POA: Insufficient documentation

## 2021-10-31 MED ORDER — CABOTEGRAVIR ER 600 MG/3ML IM SUER
600.0000 mg | Freq: Once | INTRAMUSCULAR | Status: AC
  Administered 2021-10-31: 600 mg via INTRAMUSCULAR

## 2021-10-31 NOTE — Progress Notes (Signed)
HPI: Dominique [name] is a 46 y.o. female who presents to the [facility] pharmacy clinic for Apretude administration and HIV PrEP follow up.  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   CAPSAICIN (ZOSTRIX) 0.025 % CREAM    Apply 1 application. topically 3 (three) times daily.   CETIRIZINE (ZYRTEC) 10 MG TABLET    Take 10 mg by mouth daily.   CLOPIDOGREL (PLAVIX) 75 MG TABLET    Take 0.5 tablets (37.5 mg total) by mouth daily.   DEPO-SUBQ PROVERA 104 104 MG/0.65ML INJECTION    every 3 (three) months.   DICLOFENAC SODIUM (VOLTAREN) 1 % GEL    Apply 2 g topically 4 (four) times daily.   FAMOTIDINE (PEPCID) 20 MG TABLET    Take 20 mg by mouth at bedtime.   FOLIC ACID (FOLVITE) 1 MG TABLET    Take 1 tablet (1 mg total) by mouth daily.   GABAPENTIN (NEURONTIN) 600 MG TABLET    Take 600 mg by mouth 3 (three) times daily.   HYDROXYCHLOROQUINE (PLAQUENIL) 200 MG TABLET    TAKE 1 TABLET(200 MG) BY MOUTH DAILY    MELATONIN 3 MG TABS TABLET    Take by mouth.   MELOXICAM (MOBIC) 15 MG TABLET    Take 1 tablet (15 mg total) by mouth daily.   NORTRIPTYLINE (PAMELOR) 10 MG CAPSULE    Take 1 capsule (10 mg total) by mouth at bedtime.   QUETIAPINE (SEROQUEL) 100 MG TABLET    Take 100 mg by mouth daily.   SULFASALAZINE (AZULFIDINE) 500 MG EC TABLET    Take 2 tablets (1,000 mg total) by mouth 2 (two) times daily.   VITAMIN D, ERGOCALCIFEROL, (DRISDOL) 1.25 MG (50000 UNIT) CAPS CAPSULE    Take 50,000 Units by mouth once a week.  Modified Medications   No medications on file  Discontinued Medications   No medications on file    Allergies: No Known Allergies  Past Medical History: Past Medical History:  Diagnosis Date   Aneurysm (HCC)    Arthritis    RA   Kidney stones    Lupus (HCC) 01/17/2021   Lupus (HCC)    Schizophrenia (HCC)    Systemic lupus erythematosus (HCC)     Social History: Social History   Socioeconomic History   Marital status: Single    Spouse name: Not on file   Number of children: Not on file   Years of education: Not on file   Highest education level: Not on file  Occupational History  Not on file  Tobacco Use   Smoking status: Former    Packs/day: 0.25    Years: 15.00    Total pack years: 3.75    Types: Cigarettes    Quit date: 07/15/2021    Years since quitting: 0.2   Smokeless tobacco: Never  Vaping Use   Vaping Use: Never used  Substance and Sexual Activity   Alcohol use: Not Currently   Drug use: Yes    Types: Marijuana    Comment: Last use was on 07/15/21   Sexual activity: Yes    Partners: Male    Birth control/protection: Surgical, Injection  Other Topics Concern   Not on file  Social History Narrative   Right handed   Social Determinants of Health   Financial Resource Strain: Not on file  Food Insecurity: Not on file  Transportation Needs: Not on file  Physical Activity: Not on file  Stress: Not on file  Social Connections: Not on file     Labs: Lab Results  Component Value Date   HIV1RNAQUANT Not Detected 09/02/2021   HIV1RNAQUANT Not Detected 06/25/2021   HIV1RNAQUANT Not Detected 05/02/2021    RPR and STI Lab Results  Component Value Date   LABRPR REACTIVE (A) 06/25/2021   LABRPR REACTIVE (A) 12/18/2020   LABRPR Non Reactive 10/16/2019   RPRTITER 1:8 (H) 06/25/2021   RPRTITER 1:16 (H) 12/18/2020    STI Results GC GC CT CT  09/02/2021  9:56 AM Negative   Negative    06/25/2021  8:33 AM Negative   Negative    01/30/2021 10:14 AM Negative   Negative    10/16/2019  3:39 PM Negative   Negative    05/09/2013 10:57 AM  NEGATIVE   NEGATIVE   12/07/2011 12:20 PM   NEGATIVE      Hepatitis B Lab Results  Component Value Date   HEPBSAB NON-REACTIVE 12/18/2020   HEPBSAG NON-REACTIVE 12/18/2020   HEPBCAB NON-REACTIVE 12/18/2020   Hepatitis C Lab Results  Component Value Date   HEPCAB NON-REACTIVE 12/18/2020   Hepatitis A No results found for: "HAV" Lipids: Lab Results  Component Value Date   CHOL 154 07/28/2019   TRIG 96 07/28/2019   HDL 57 07/28/2019   CHOLHDL 2.7 07/28/2019   LDLCALC 79 07/28/2019    TARGET DATE: The 3rd of the month  Current PrEP Regimen: Apretude   Assessment: Dominique [name] presents today for their Apretude injection and to follow up for HIV PrEP. No issues with past injections. No known exposures to any STIs and no signs or symptoms of any STIs today. Last STI screening was 6/1 and was negative. Screened patient for acute HIV symptoms such as fatigue, muscle aches, rash, sore throat, lymphadenopathy, headache, night sweats, nausea/vomiting/diarrhea, and fever. Patient denies any symptoms. No new partners since last injection. Rapid HIV blood test was drawn immediately prior to injection and was negative. Patient also had HIV RNA drawn today along with urine cytologies.  Administered cabotegravir 600mg /53mL in right upper outer quadrant of the gluteal muscle. Will make follow up  appointments for maintenance injections every 2 months.   Plan: - Maintenance injections scheduled for 10/3 with me  - Check HIV RNA and urine cytologies  - Call with any issues or questions  1m, PharmD, CPP, BCIDP Clinical Pharmacist Practitioner Infectious Diseases Clinical Pharmacist [facility]

## 2021-11-03 LAB — URINE CYTOLOGY ANCILLARY ONLY
Chlamydia: NEGATIVE
Comment: NEGATIVE
Comment: NORMAL
Neisseria Gonorrhea: NEGATIVE

## 2021-11-03 LAB — HIV-1 RNA QUANT-NO REFLEX-BLD
HIV 1 RNA Quant: NOT DETECTED Copies/mL
HIV-1 RNA Quant, Log: NOT DETECTED Log cps/mL

## 2021-12-03 ENCOUNTER — Other Ambulatory Visit: Payer: Self-pay | Admitting: Internal Medicine

## 2021-12-03 DIAGNOSIS — M359 Systemic involvement of connective tissue, unspecified: Secondary | ICD-10-CM

## 2021-12-03 NOTE — Progress Notes (Signed)
Office Visit Note  Patient: Dominique [name]             Date of Birth: [birth year]           MRN: [id number]             PCP: Dot Been, FNP Referring: Dot Been, FNP Visit Date: 12/10/2021   Subjective:  Follow-up (Feet and legs are starting to get worse. Pain around knees ankles and swelling bad at night and in the mornings. )   History of Present Illness: Dominique [name] is a 46 y.o. 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  Most symptoms are stable main complaint today is bilateral foot and ankle pain and swelling. No obvious difference off of HCQ. Pain is throughout the day describes as stabbing with pins type of sensation. She is seeing swelling accumulate usually by the evening and worse when sitting or standing in fixed position for a long time.   Previous HPI 09/12/2021 Dominique [name] is a 46 y.o. 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 She feels symptoms partially improved with less knee pain but continues to have every day pain in bilateral feet and ankles. Pain is first thing in the morning as well as worsening if she is on her feet all day. Feet swelling is noticeable with prolonged standing worse later in the day. She had coil embolization procedure uneventfully. She has noticed some progression of facial rash.   Previous HPI 07/21/21 Dominique [name] is a 46 y.o. 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 So far she still has about the same amount of pain worst affected areas in both heels and in mid feet. Plantar fasciitis exercises mildly helpful for the bottom of the feet but not much. 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    Previous HPI 05/20/21 Dominique [name] is a 46 y.o. 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 So far her symptoms are about the same. She continues having pain worst in her feet and less extent along medial side of knees. She does not see much swelling or discoloration just pain. Has not noticed any side effects of the methotrexate.   Previous HPI 01/23/21 Dominique [name] is a 46 y.o. female here for evaluation of positive ANA and elevated sedimentation rate with multiple systemic symptoms including weight loss, lymphadenopathy, paresthesias, joint pain, and headaches.  She started noticing hyperpigmented skin rashes developing on her face and extremities since several months ago does not recall specific onset or provoking episode.  Then developed worsening joint pain in multiple sites but started around July of this year.  This involved pain affecting both hands also the hips knees and feet bilaterally.  She described morning stiffness lasting 2 or 3 hours in duration also especially pain in the bottoms of the feet first and getting out of bed.  After a month with these ongoing and somewhat worsening symptoms she was seen in primary care clinic. Workup at that time positive for trichomoniasis and syphilis and starting antibiotics also found to have high sedimentation rate with positive ANA and CCP Abs. She followed  up for continued treatment last month and more recently in ID clinic with completion of antibiotic treatment for syphilis with Bicillin.  Neurology evaluation with lumbar puncture that was not suggestive for active neurosyphilis. She saw ophthalmology for visual change symptoms reports findings were not concerning for infectious or inflammatory type changes.     Review of Systems  Constitutional:  Negative for fatigue.  HENT:  Negative for mouth sores and mouth dryness.   Eyes:  Negative for dryness.  Respiratory:  Positive for shortness of breath.   Cardiovascular:  Negative for palpitations.  Gastrointestinal:   Negative for blood in stool, constipation and diarrhea.  Endocrine: Negative for increased urination.  Genitourinary:  Negative for involuntary urination.  Musculoskeletal:  Positive for joint pain, gait problem, joint pain, joint swelling, myalgias, muscle weakness, morning stiffness, muscle tenderness and myalgias.  Skin:  Positive for color change, hair loss and sensitivity to sunlight. Negative for rash.  Allergic/Immunologic: Negative for susceptible to infections.  Neurological:  Positive for dizziness and headaches.  Hematological:  Positive for swollen glands.  Psychiatric/Behavioral:  Positive for depressed mood and sleep disturbance. The patient is nervous/anxious.     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   Right hip pain 05/16/2020   BRBPR (bright red blood per rectum) 10/18/2019   Tobacco use disorder 07/31/2019   Dyspnea, paroxysmal nocturnal 07/31/2019   Schizophrenia (HCC) 07/31/2019   Encounter for screening examination for sexually transmitted disease 07/31/2019   Abnormal uterine bleeding (AUB) 05/09/2013   BV (bacterial vaginosis) 05/09/2013   Dysmenorrhea 05/09/2013    Past Medical History:  Diagnosis Date   Aneurysm (HCC)    Arthritis    RA   Kidney stones    Lupus (HCC) 01/17/2021   Lupus (HCC)    Schizophrenia (HCC)    Systemic lupus erythematosus (HCC)     Family History  Problem Relation Age of Onset   Stroke Mother    Multiple sclerosis Father    Hypertension Father    Diabetes Father    Asthma Brother    Dementia Maternal  Grandfather    Past Surgical History:  Procedure Laterality Date   CESAREAN SECTION     HERNIA REPAIR     IR 3D INDEPENDENT WKST  07/28/2021   IR ANGIO INTRA EXTRACRAN SEL INTERNAL CAROTID BILAT MOD SED  07/28/2021   IR ANGIO VERTEBRAL SEL VERTEBRAL UNI L MOD SED  07/28/2021   IR ANGIOGRAM FOLLOW UP STUDY  07/28/2021   IR CT HEAD LTD  07/28/2021   IR NEURO EACH ADD'L AFTER BASIC UNI LEFT (MS)  07/28/2021   IR RADIOLOGIST EVAL & MGMT  07/10/2021   IR RADIOLOGIST EVAL & MGMT  08/15/2021   IR TRANSCATH/EMBOLIZ  07/28/2021   IR US GUIDE VASC ACCESS RIGHT  07/28/2021   RADIOLOGY WITH ANESTHESIA N/A 07/28/2021   Procedure: EMBOLIZATION;  Surgeon: Julieanne Cotton, MD;  Location: [facility] OR;  Service: Radiology;  Laterality: N/A;   Social History   Social History Narrative   Right handed   Immunization History  Administered Date(s) Administered   Hepb-cpg 06/25/2021, 09/02/2021  Influenza,inj,Quad PF,6+ Mos 01/30/2021     Objective: Vital Signs: BP 109/70 (BP Location: Left Arm, Patient Position: Sitting, Cuff Size: Normal)   Pulse 72   Resp 16   Ht 5' (1.524 m)   Wt 117 lb 9.6 oz (53.3 kg)   BMI 22.97 kg/m    Physical Exam HENT:     Mouth/Throat:     Pharynx: Oropharynx is clear.  Eyes:     Conjunctiva/sclera: Conjunctivae normal.  Cardiovascular:     Rate and Rhythm: Normal rate and regular rhythm.  Pulmonary:     Effort: Pulmonary effort is normal.     Breath sounds: Normal breath sounds.  Musculoskeletal:     Right lower leg: No edema.     Left lower leg: No edema.  Skin:    General: Skin is warm and dry.     Comments: Flat skin pigmentation changes worst around periphery of face  Neurological:     Mental Status: She is alert.  Psychiatric:        Mood and Affect: Mood normal.      Musculoskeletal Exam:  Shoulders full ROM no tenderness or swelling Elbows full ROM no tenderness or swelling Wrists full ROM no tenderness or swelling Fingers full ROM no tenderness or  swelling No paraspinal tenderness to palpation over upper and lower back Hip normal internal and external rotation without pain, no tenderness to lateral hip palpation Knees full ROM no tenderness or swelling Diffuse tenderness to pressure starting from just above ankle down to all toes, no visible swelling, full ROM intact, limited ultrasound inspection without any visible effusions, tendon nodules, or abnormal color doppler enhancement.   Investigation: No additional findings.  Imaging: No results found.  Recent Labs: Lab Results  Component Value Date   WBC 6.0 10/12/2021   HGB 10.6 (L) 10/12/2021   PLT 209 10/12/2021   NA 139 10/12/2021   K 3.1 (L) 10/12/2021   CL 110 10/12/2021   CO2 22 10/12/2021   GLUCOSE 98 10/12/2021   BUN 8 10/12/2021   CREATININE 0.70 10/12/2021   BILITOT 0.7 10/12/2021   ALKPHOS 66 10/12/2021   AST 19 10/12/2021   ALT 17 10/12/2021   PROT 7.2 10/12/2021   ALBUMIN 4.1 10/12/2021   CALCIUM 9.5 10/12/2021   GFRAA 117 05/20/2020    Speciality Comments: LMOM for Dr. Laruth Bouchard office to fax PLQ eye exam 06/18/2021 shc  Procedures:  No procedures performed Allergies: Patient has no known allergies.   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  Her inflammatory markers have been previously improved as well.  Rechecking sedimentation rate today.  I think especially if these are normal would be more suspicious for alternate cause either use related or neuropathic pain source.  Plan to continue the sulfasalazine 1000 mg twice daily.  High risk medication use - SSZ 1000 mg BID - Plan: CBC with Differential/Platelet, COMPLETE METABOLIC PANEL WITH GFR  Checking CBC and CMP for sulfasalazine medication monitoring.  She has not had any infections or major  complications.  No significant change in her hyperpigmented skin rashes since stopping hydroxychloroquine.    Orders: Orders Placed This Encounter  Procedures   Sedimentation rate   CBC with Differential/Platelet   COMPLETE METABOLIC PANEL WITH GFR   No orders of the defined types were  placed in this encounter.    Follow-Up Instructions: Return in about 3 months (around 03/11/2022) for UCTDvRA on SSZ f/u 3mos.   Fuller Plan, MD  Note - This record has been created using [employer].  Chart creation errors have been sought, but may not always  have been located. Such creation errors do not reflect on  the standard of medical care.

## 2021-12-03 NOTE — Telephone Encounter (Signed)
Next Visit: 12/10/2021  Last Visit: 09/12/2021  Last Fill: 10/07/2021  DX: Undifferentiated connective tissue disease  Current Dose per office note 09/12/2021: SSZ 1000 mg BID   Labs: 10/12/2021 CBC WNL except RBC 3.36 LOW, Hemoglobin 10.6 LOW, HCT 31.5 LOW CMP WNL except Potassium 3.1 LOW  Okay to refill Sulfasalazine?

## 2021-12-04 MED ORDER — SULFASALAZINE 500 MG PO TBEC: 1000.0000 mg | DELAYED_RELEASE_TABLET | Freq: Two times a day (BID) | ORAL | 0 refills | Status: DC

## 2021-12-10 ENCOUNTER — Encounter: Payer: Self-pay | Admitting: Internal Medicine

## 2021-12-10 ENCOUNTER — Ambulatory Visit: Payer: Medicaid Other | Attending: Internal Medicine | Admitting: Internal Medicine

## 2021-12-10 VITALS — BP 109/70 | HR 72 | Resp 16 | Ht 60.0 in | Wt 117.6 lb

## 2021-12-10 DIAGNOSIS — Z79899 Other long term (current) drug therapy: Secondary | ICD-10-CM

## 2021-12-10 DIAGNOSIS — M25571 Pain in right ankle and joints of right foot: Secondary | ICD-10-CM | POA: Diagnosis not present

## 2021-12-10 DIAGNOSIS — M0600 Rheumatoid arthritis without rheumatoid factor, unspecified site: Secondary | ICD-10-CM | POA: Diagnosis not present

## 2021-12-10 DIAGNOSIS — G8929 Other chronic pain: Secondary | ICD-10-CM

## 2021-12-10 DIAGNOSIS — M359 Systemic involvement of connective tissue, unspecified: Secondary | ICD-10-CM

## 2021-12-10 DIAGNOSIS — M25572 Pain in left ankle and joints of left foot: Secondary | ICD-10-CM | POA: Diagnosis present

## 2021-12-11 LAB — CBC WITH DIFFERENTIAL/PLATELET
Absolute Monocytes: 250 cells/uL (ref 200–950)
Basophils Absolute: 10 cells/uL (ref 0–200)
Basophils Relative: 0.2 %
Eosinophils Absolute: 20 cells/uL (ref 15–500)
Eosinophils Relative: 0.4 %
HCT: 31.9 % — ABNORMAL LOW (ref 35.0–45.0)
Hemoglobin: 10.7 g/dL — ABNORMAL LOW (ref 11.7–15.5)
Lymphs Abs: 2097 cells/uL (ref 850–3900)
MCH: 31.6 pg (ref 27.0–33.0)
MCHC: 33.5 g/dL (ref 32.0–36.0)
MCV: 94.1 fL (ref 80.0–100.0)
MPV: 10.6 fL (ref 7.5–12.5)
Monocytes Relative: 5.1 %
Neutro Abs: 2524 cells/uL (ref 1500–7800)
Neutrophils Relative %: 51.5 %
Platelets: 177 10*3/uL (ref 140–400)
RBC: 3.39 10*6/uL — ABNORMAL LOW (ref 3.80–5.10)
RDW: 12.7 % (ref 11.0–15.0)
Total Lymphocyte: 42.8 %
WBC: 4.9 10*3/uL (ref 3.8–10.8)

## 2021-12-11 LAB — COMPLETE METABOLIC PANEL WITH GFR
AG Ratio: 1.8 (calc) (ref 1.0–2.5)
ALT: 12 U/L (ref 6–29)
AST: 14 U/L (ref 10–35)
Albumin: 4.4 g/dL (ref 3.6–5.1)
Alkaline phosphatase (APISO): 59 U/L (ref 31–125)
BUN/Creatinine Ratio: 7 (calc) (ref 6–22)
BUN: 6 mg/dL — ABNORMAL LOW (ref 7–25)
CO2: 24 mmol/L (ref 20–32)
Calcium: 9.3 mg/dL (ref 8.6–10.2)
Chloride: 108 mmol/L (ref 98–110)
Creat: 0.86 mg/dL (ref 0.50–0.99)
Globulin: 2.5 g/dL (calc) (ref 1.9–3.7)
Glucose, Bld: 87 mg/dL (ref 65–99)
Potassium: 3.6 mmol/L (ref 3.5–5.3)
Sodium: 138 mmol/L (ref 135–146)
Total Bilirubin: 0.4 mg/dL (ref 0.2–1.2)
Total Protein: 6.9 g/dL (ref 6.1–8.1)
eGFR: 85 mL/min/{1.73_m2} (ref >=60)

## 2021-12-11 LAB — SEDIMENTATION RATE: Sed Rate: 14 mm/h (ref 0–20)

## 2021-12-11 NOTE — Progress Notes (Signed)
Lab results look fine for continuing the sulfasalazine. Sedimentation rate remains normal. I suspect foot and ankle pain may be nerve related instead of from the joint since I could not see or measure any particular inflammation.

## 2021-12-13 DIAGNOSIS — M0600 Rheumatoid arthritis without rheumatoid factor, unspecified site: Secondary | ICD-10-CM | POA: Insufficient documentation

## 2021-12-19 ENCOUNTER — Other Ambulatory Visit (HOSPITAL_COMMUNITY): Payer: Self-pay

## 2021-12-22 ENCOUNTER — Other Ambulatory Visit (HOSPITAL_COMMUNITY): Payer: Self-pay

## 2021-12-23 ENCOUNTER — Telehealth: Payer: Self-pay

## 2021-12-23 NOTE — Telephone Encounter (Signed)
[facility] Patient Advocate Encounter  Patient's medication (Apretude) have been couriered to [facility] from [facility] and will be administered on the patient next office visit on 12/30/21.  Asbury Hair , CPhT Specialty Pharmacy Patient Adv[facility] Phone: [phone] Fax:  [phone]  

## 2021-12-30 ENCOUNTER — Encounter: Payer: Self-pay | Admitting: Pharmacist

## 2021-12-30 ENCOUNTER — Other Ambulatory Visit: Payer: Medicaid Other

## 2021-12-30 ENCOUNTER — Ambulatory Visit: Payer: Medicaid Other | Admitting: Pharmacist

## 2021-12-31 ENCOUNTER — Ambulatory Visit (INDEPENDENT_AMBULATORY_CARE_PROVIDER_SITE_OTHER): Payer: Medicaid Other | Admitting: Pharmacist

## 2021-12-31 ENCOUNTER — Other Ambulatory Visit: Payer: Self-pay

## 2021-12-31 DIAGNOSIS — Z79899 Other long term (current) drug therapy: Secondary | ICD-10-CM

## 2021-12-31 DIAGNOSIS — Z23 Encounter for immunization: Secondary | ICD-10-CM | POA: Diagnosis not present

## 2021-12-31 MED ORDER — CABOTEGRAVIR ER 600 MG/3ML IM SUER
600.0000 mg | Freq: Once | INTRAMUSCULAR | Status: AC
  Administered 2021-12-31: 600 mg via INTRAMUSCULAR

## 2021-12-31 NOTE — Progress Notes (Signed)
HPI: Dominique [name] is a 46 y.o. female who presents to the [place] clinic for Apretude administration and HIV PrEP follow up.  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   CAPSAICIN (ZOSTRIX) 0.025 % CREAM    Apply 1 application. topically 3 (three) times daily.   CETIRIZINE (ZYRTEC) 10 MG TABLET    Take 10 mg by mouth daily.   CLOPIDOGREL (PLAVIX) 75 MG TABLET    Take 0.5 tablets (37.5 mg total) by mouth daily.   DEPO-SUBQ PROVERA 104 104 MG/0.65ML INJECTION    every 3 (three) months.   DICLOFENAC SODIUM (VOLTAREN) 1 % GEL    Apply 2 g topically 4 (four) times daily.   EMOLLIENT [facility]) OINT    Apply topically.   FAMOTIDINE (PEPCID) 20 MG TABLET    Take 20 mg by mouth at bedtime.   FOLIC ACID (FOLVITE) 1 MG TABLET    Take 1 tablet (1 mg total) by mouth daily.   GABAPENTIN (NEURONTIN) 600 MG TABLET    Take 600 mg by mouth 3 (three) times  daily.   HYDROXYCHLOROQUINE (PLAQUENIL) 200 MG TABLET    TAKE 1 TABLET(200 MG) BY MOUTH DAILY   MELATONIN 3 MG TABS TABLET    Take by mouth.   MELOXICAM (MOBIC) 15 MG TABLET    Take 1 tablet (15 mg total) by mouth daily.   MY WAY 1.5 MG TABLET    Take by mouth.   NORTRIPTYLINE (PAMELOR) 10 MG CAPSULE    Take 1 capsule (10 mg total) by mouth at bedtime.   QUETIAPINE (SEROQUEL) 100 MG TABLET    Take 100 mg by mouth daily.   SULFASALAZINE (AZULFIDINE) 500 MG EC TABLET    Take 2 tablets (1,000 mg total) by mouth 2 (two) times daily.   VALACYCLOVIR (VALTREX) 500 MG TABLET    Take 500 mg by mouth daily.   VITAMIN D, ERGOCALCIFEROL, (DRISDOL) 1.25 MG (50000 UNIT) CAPS CAPSULE    Take 50,000 Units by mouth once a week.  Modified Medications   No medications on file  Discontinued Medications   No medications on file    Allergies: No Known Allergies  Past Medical History: Past Medical History:  Diagnosis Date   Aneurysm ([place])    Arthritis    RA   Kidney stones    Lupus ([place]) 01/17/2021   Lupus ([place])    Schizophrenia (HCC)    Systemic lupus erythematosus ([place])     Social History:  Social History   Socioeconomic History   Marital status: Single    Spouse name: Not on file   Number of children: Not on file   Years of education: Not on file   Highest education level: Not on file  Occupational History   Not on file  Tobacco Use   Smoking status: Former    Packs/day: 0.25    Years: 15.00    Total pack years: 3.75    Types: Cigarettes    Quit date: 07/15/2021    Years since quitting: 0.4   Smokeless tobacco: Never  Vaping Use   Vaping Use: Never used  Substance and Sexual Activity   Alcohol use: Not Currently   Drug use: Not Currently    Types: Marijuana    Comment: Last use was on 07/15/21   Sexual activity: Yes    Partners: Male    Birth control/protection: Surgical, Injection  Other Topics Concern   Not on file  Social History Narrative   Right handed   Social  Determinants of Health   Financial Resource Strain: Not on file  Food Insecurity: Not on file  Transportation Needs: Not on file  Physical Activity: Not on file  Stress: Not on file  Social Connections: Not on file    Labs: Lab Results  Component Value Date   HIV1RNAQUANT Not Detected 10/31/2021   HIV1RNAQUANT Not Detected 09/02/2021   HIV1RNAQUANT Not Detected 06/25/2021    RPR and STI Lab Results  Component Value Date   LABRPR REACTIVE (A) 06/25/2021   LABRPR REACTIVE (A) 12/18/2020   LABRPR Non Reactive 10/16/2019   RPRTITER 1:8 (H) 06/25/2021   RPRTITER 1:16 (H) 12/18/2020    STI Results GC GC CT CT  10/31/2021  9:06 AM Negative   Negative    09/02/2021  9:56 AM Negative   Negative    06/25/2021  8:33 AM Negative   Negative    01/30/2021 10:14 AM Negative   Negative    10/16/2019  3:39 PM Negative   Negative    05/09/2013 10:57 AM  NEGATIVE   NEGATIVE   12/07/2011 12:20 PM   NEGATIVE      Hepatitis B Lab Results  Component Value Date   HEPBSAB NON-REACTIVE 12/18/2020   HEPBSAG NON-REACTIVE 12/18/2020   HEPBCAB NON-REACTIVE 12/18/2020   Hepatitis C Lab Results  Component Value Date   HEPCAB NON-REACTIVE 12/18/2020   Hepatitis A No results found for: "HAV" Lipids: Lab Results  Component Value Date   CHOL 154 07/28/2019   TRIG 96 07/28/2019   HDL 57 07/28/2019   CHOLHDL 2.7 07/28/2019   LDLCALC 79 07/28/2019    TARGET DATE: The 3rd of the month  Current PrEP Regimen: Apretude  Assessment: Dominique [name] presents today for their Apretude injection and to follow up for HIV PrEP. No issues with past injections. No known exposures to any STIs and no signs or symptoms of any STIs today. Last STI screening was 8/4 and was negative. Screened patient for acute HIV symptoms such as fatigue, muscle aches, rash, sore throat, lymphadenopathy, headache, night sweats, nausea/vomiting/diarrhea, and fever. Patient denies any symptoms. No new partners since last injection.  Patient also had HIV RNA drawn today along with a confirmatory hepatitis B surface antibody as she recently completed the hepatitis B vaccination series in June.  Per [employer] guidelines, a rapid HIV test should be drawn prior to Apretude administration. Due to state shortage of rapid HIV tests, this is temporarily unable to be done. Per  decision from [facility], we will proceed with Apretude administration at this time without a negative rapid HIV test beforehand. HIV RNA was collected today and is in process.  Administered cabotegravir 600mg /32mL in right upper outer quadrant of the gluteal muscle. Will make follow up appointments for maintenance injections every 2 months.   Patient eligible for annual flu vaccine which was administered today.   Plan: - Maintenance injections scheduled for 11/29, 1/31, and 3/27 with me  - Check HIV RNA and HBV sAb - Administer annual flu vaccination - Call with any issues or questions  Alfonse Spruce, PharmD, CPP, [facility] Clinical Pharmacist Practitioner [facility]

## 2022-01-03 LAB — HIV-1 RNA QUANT-NO REFLEX-BLD
HIV 1 RNA Quant: NOT DETECTED Copies/mL
HIV-1 RNA Quant, Log: NOT DETECTED Log cps/mL

## 2022-01-03 LAB — HEPATITIS B SURFACE ANTIBODY,QUALITATIVE: Hep B S Ab: REACTIVE — AB

## 2022-01-09 DIAGNOSIS — J301 Allergic rhinitis due to pollen: Secondary | ICD-10-CM | POA: Insufficient documentation

## 2022-01-09 DIAGNOSIS — G8929 Other chronic pain: Secondary | ICD-10-CM | POA: Insufficient documentation

## 2022-01-09 DIAGNOSIS — E559 Vitamin D deficiency, unspecified: Secondary | ICD-10-CM | POA: Insufficient documentation

## 2022-02-04 DIAGNOSIS — R519 Headache, unspecified: Secondary | ICD-10-CM | POA: Insufficient documentation

## 2022-02-04 DIAGNOSIS — Z840 Family history of diseases of the skin and subcutaneous tissue: Secondary | ICD-10-CM | POA: Insufficient documentation

## 2022-02-04 DIAGNOSIS — Z800 Family history of malignant neoplasm of digestive organs: Secondary | ICD-10-CM | POA: Insufficient documentation

## 2022-02-04 DIAGNOSIS — F1210 Cannabis abuse, uncomplicated: Secondary | ICD-10-CM | POA: Insufficient documentation

## 2022-02-04 DIAGNOSIS — Z833 Family history of diabetes mellitus: Secondary | ICD-10-CM | POA: Insufficient documentation

## 2022-02-10 ENCOUNTER — Other Ambulatory Visit (HOSPITAL_COMMUNITY): Payer: Self-pay

## 2022-02-11 ENCOUNTER — Telehealth: Payer: Self-pay

## 2022-02-11 NOTE — Telephone Encounter (Signed)
[facility] Patient Advocate Encounter  Patient's medication (Apretude) have been couriered to [facility] from [facility] and will be administered on the patient next office visit on 02/25/22.  Clearance Coots , CPhT Specialty Pharmacy Patient [facility] Phone: [phone] Fax:  [phone]

## 2022-02-12 ENCOUNTER — Other Ambulatory Visit: Payer: Self-pay | Admitting: Internal Medicine

## 2022-02-12 DIAGNOSIS — R21 Rash and other nonspecific skin eruption: Secondary | ICD-10-CM

## 2022-02-12 DIAGNOSIS — M2550 Pain in unspecified joint: Secondary | ICD-10-CM

## 2022-02-12 DIAGNOSIS — R768 Other specified abnormal immunological findings in serum: Secondary | ICD-10-CM

## 2022-02-16 NOTE — Progress Notes (Unsigned)
Virtual Visit via Video Note  Consent was obtained for video visit:  Yes.   Answered questions that patient had about telehealth interaction:  Yes.   I discussed the limitations, risks, security and privacy concerns of performing an evaluation and management service by telemedicine. I also discussed with the patient that there may be a patient responsible charge related to this service. The patient expressed understanding and agreed to proceed.  Pt location: Home Physician Location: office Name of referring provider:  Dot Been, FNP I connected with Dominique [name] at patients initiation/request on 02/17/2022 at  8:30 AM EST by video enabled telemedicine application and verified that I am speaking with the correct person using two identifiers. Pt MRN:  [id number] Pt DOB:  08/03/1975 Video Participants:  Dominique [name]   Assessment/Plan:   Generalized pain, dysesthesias, paresthesias - may be neuropathy  Migraine without aura, without status migrainosus, not intractable Cerebral aneurysm s/p embolization Memory deficits - likely related to chronic pain.  Not neurosyphilis.     1  Increase nortriptyline to 25mg  at bedtime.  If no significant improvement in 6 weeks, she is instructed to contact me and we can increase dose to 50mg  at bedtime 2  Advised to contact Dr. office again regarding the MRIs.  I have sent a staff message myself.  3  Limit use of pain relievers to no more than 2 days out of week to prevent risk of rebound or medication-overuse headache. 4  Keep headache diary 5  Follow up 4-5 months.     Subjective:  Dominique [name] is a 46 year old female with syphilis and SLE who follows up for headache, paresthesias and memory deficits.    UPDATE: Started nortriptyline in July for headaches.  It has been helpful.  They are still daily but less severe (now moderate).   She was supposed to have repeat MRI in September.  Never received a call about  scheduling an appointment.    Current NSAIDs/analgesics:  Tylenol, meloxicam Current antidepressants/antipsychotics:  nortriptyline 10mg  QHS, quetiapine Current antiepileptic medications:  gabapentin 600mg  TID Other medications:  Plaquenil     HISTORY: In early 2022, she began experiencing sharp pain, numbness and swelling in the feet.  It subsequently started radiating up to her calves and knees.  Difficult to walk as it would cause painful pins and needles sensation.  She has pain in multiple joints.  She started experiencing numbness in the hands making it difficult to grasp objects.  Symptoms started getting worse in August 2022.  She began feeling forgetful.  Blood work was positive for syphilis (RPR).  Given her symptoms, she underwent lumbar puncture to test for neuro-syphilis, which demonstrated a negative VDRL.  CT head on 12/23/2020 personally reviewed was negative. She has seen rheumatology.  She has been diagnosed with SLE.  She had also been experiencing headaches for the same amount of time, described as 9/10 posterior pounding pain (sometimes left frontal region) with dizziness, phonophobia and blurred vision (but no nausea, vomiting, photophobia, double vision) lasing 2 hours with Tylenol and occurring almost every other day.  Treats with Tylenol daily (also treats for lupus pain).  Labs from 03/11/2021 revealed CK 118, aldolase 2.2, ACE 25, negative SPEP/IFE and B12 241.  She was advised to start OTC 2023 daily.  No improvement in paresthesias or memory since starting supplement.  NCV-EMG on 04/10/2021 was stopped prematurely due to pain but right median motor and right median, ulnar and mixed palmar sensory  responses were normal.  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  CTA of head on 06/19/2021 confirmed a 3 x 5 mm acom aneurysm projecting to the right.  She saw Dr. Corliss Skains of endovascular and  underwent embolization on 07/28/2021.   Past antidepressants:  Wellbutrin, citalopram  Labs: 11/07/2020: ANA positive, weakly positive anti-CCP ab, negative RF, sed rate 56, T4 12.9, T3 Update 34%, free thyroxine index 4.4, Hgb A1c 5.3%, RPR reactive 1:32, D 25-hydroxy 25.8, Quantiferon-TB Gold Plus negative 12/18/2020:  Hepatitis B/C panel negative, HIV nefative 12/26/2020:  CSF with cell count 0, culture negative, protein 43, glucose 61, cytology negative, VDRL nonreactive 01/17/2021:  HIV negative 01/23/2021:  ds-DNA ab negative, Scl-70 ab negative, RNP ab negative, anti-Smith ab negative, C3 142, C4 28, sed rate 36 03/07/2021:  HIV negative 03/11/2021: CK 118, aldolase 2.2, ACE 25, negative SPEP/IFE and B12 241.   Imaging:  05/21/2021 MRI BRAIN W WO:  1. Intermittently motion degraded exam.  2. No evidence of acute intracranial abnormality.  3. There are a few nonspecific punctate T2 FLAIR hyperintense remote insults scattered within the bilateral cerebral white matter.  4. 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 MR or CT angiography of the head is recommended for further evaluation.  5. 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  6. Mild paranasal sinus disease. 06/19/2021 CTA HEAD:  1. Negative CT head  2. No intracranial stenosis or atherosclerotic disease  3. 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   Past Medical History: Past Medical History:  Diagnosis Date   Aneurysm (HCC)    Arthritis    RA   Kidney stones    Lupus (HCC) 01/17/2021    Lupus (HCC)    Schizophrenia (HCC)    Systemic lupus erythematosus (HCC)     Medications: Outpatient Encounter Medications as of 02/17/2022  Medication Sig Note   cabotegravir ER (APRETUDE) 600 MG/3ML injection Inject 3 mLs (600 mg total) into the muscle every 2 (two) months.    capsaicin (ZOSTRIX) 0.025 % cream Apply 1 application. topically 3 (three) times daily.    cetirizine (ZYRTEC) 10 MG tablet Take 10 mg by mouth daily.    citalopram (CELEXA) 20 MG tablet Take 20 mg by mouth daily.    clopidogrel (PLAVIX) 75 MG tablet Take 0.5 tablets (37.5 mg total) by mouth daily.    DEPO-SUBQ PROVERA 104 104 MG/0.65ML injection every 3 (three) months.    diclofenac Sodium (VOLTAREN) 1 % GEL Apply 2 g topically 4 (four) times daily.    [facility]) OINT Apply topically.    famotidine (PEPCID) 20 MG tablet Take 20 mg by mouth at bedtime.    folic acid (FOLVITE) 1 MG tablet Take 1 tablet (1 mg total) by mouth daily.    gabapentin (NEURONTIN) 600 MG tablet Take 600 mg by mouth 3 (three) times daily.    hydroxychloroquine (PLAQUENIL) 200 MG tablet TAKE 1 TABLET(200 MG) BY MOUTH DAILY    melatonin 3 MG TABS tablet Take by mouth.    meloxicam (MOBIC) 15 MG tablet Take 1 tablet (15 mg  total) by mouth daily.    nortriptyline (PAMELOR) 10 MG capsule Take 1 capsule (10 mg total) by mouth at bedtime.    QUEtiapine (SEROQUEL) 100 MG tablet Take 100 mg by mouth daily.    traZODone (DESYREL) 50 MG tablet Take 50 mg by mouth at bedtime.    valACYclovir (VALTREX) 500 MG tablet Take 500 mg by mouth daily. 02/17/2022: As needed   Vitamin D, Ergocalciferol, (DRISDOL) 1.25 MG (50000 UNIT) CAPS capsule Take 50,000 Units by mouth once a week.    [DISCONTINUED] MY WAY 1.5 MG tablet Take by mouth.    [DISCONTINUED] sulfaSALAzine (AZULFIDINE) 500 MG EC tablet Take 2 tablets (1,000 mg total) by mouth 2 (two) times daily.    No facility-administered encounter medications on file as of 02/17/2022.     Allergies: No Known Allergies  Family History: Family History  Problem Relation Age of Onset   Stroke Mother    Multiple sclerosis Father    Hypertension Father    Diabetes Father    Asthma Brother    Dementia Maternal Grandfather     Observations/Objective:   No acute distress.  Alert and oriented.  Speech fluent and not dysarthric.  Language intact.     Follow Up Instructions:    -I discussed the assessment and treatment plan with the patient. The patient was provided an opportunity to ask questions and all were answered. The patient agreed with the plan and demonstrated an understanding of the instructions.   The patient was advised to call back or seek an in-person evaluation if the symptoms worsen or if the condition fails to improve as anticipated.  Shon Millet, DO   CC: Sherron Flemings, FNP

## 2022-02-17 ENCOUNTER — Telehealth (INDEPENDENT_AMBULATORY_CARE_PROVIDER_SITE_OTHER): Payer: Medicaid Other | Admitting: Neurology

## 2022-02-17 ENCOUNTER — Other Ambulatory Visit (HOSPITAL_COMMUNITY): Payer: Self-pay | Admitting: Interventional Radiology

## 2022-02-17 ENCOUNTER — Other Ambulatory Visit: Payer: Self-pay | Admitting: *Deleted

## 2022-02-17 ENCOUNTER — Encounter: Payer: Self-pay | Admitting: Neurology

## 2022-02-17 VITALS — Ht 60.0 in | Wt 117.0 lb

## 2022-02-17 DIAGNOSIS — G43009 Migraine without aura, not intractable, without status migrainosus: Secondary | ICD-10-CM

## 2022-02-17 DIAGNOSIS — G894 Chronic pain syndrome: Secondary | ICD-10-CM

## 2022-02-17 DIAGNOSIS — R21 Rash and other nonspecific skin eruption: Secondary | ICD-10-CM

## 2022-02-17 DIAGNOSIS — R768 Other specified abnormal immunological findings in serum: Secondary | ICD-10-CM

## 2022-02-17 DIAGNOSIS — I671 Cerebral aneurysm, nonruptured: Secondary | ICD-10-CM

## 2022-02-17 DIAGNOSIS — M2550 Pain in unspecified joint: Secondary | ICD-10-CM

## 2022-02-17 MED ORDER — NORTRIPTYLINE HCL 25 MG PO CAPS: 25.0000 mg | ORAL_CAPSULE | Freq: Every day | ORAL | 5 refills | Status: DC

## 2022-02-17 MED ORDER — HYDROXYCHLOROQUINE SULFATE 200 MG PO TABS: ORAL_TABLET | ORAL | 2 refills | Status: DC

## 2022-02-17 NOTE — Telephone Encounter (Signed)
Patient contacted the office stating she needs a refill on PLQ sent to [facility] [address]   Next Visit: 03/11/2022  Last Visit: 12/10/2021  Labs: 02/06/2022 CMP WNL, RBC 3.29, Hgb 10.2, Hct.   Eye exam: not on file   Current Dose per office note 12/10/2021: dose not discussed  DX: Undifferentiated connective tissue disease   Last Fill: 06/19/2021  Left message to advise patient we need her PLQ eye exam.   Okay to refill Plaquenil?

## 2022-02-24 NOTE — Progress Notes (Signed)
HPI: Dominique [name] is a 46 y.o. female who presents to the [facility] pharmacy clinic for Apretude administration and HIV PrEP follow up.  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   CAPSAICIN (ZOSTRIX) 0.025 % CREAM    Apply 1 application. topically 3 (three) times daily.   CETIRIZINE (ZYRTEC) 10 MG TABLET    Take 10 mg by mouth daily.   CLOPIDOGREL (PLAVIX) 75 MG TABLET    Take 0.5 tablets (37.5 mg total) by mouth daily.   DEPO-SUBQ PROVERA 104 104 MG/0.65ML INJECTION    every 3 (three) months.   DICLOFENAC SODIUM (VOLTAREN) 1 % GEL    Apply 2 g topically 4 (four) times daily.   FAMOTIDINE (PEPCID) 20 MG TABLET    Take 20 mg by mouth at bedtime.   FOLIC ACID (FOLVITE) 1 MG TABLET    Take 1 tablet (1 mg total) by mouth daily.   GABAPENTIN (NEURONTIN) 600 MG TABLET    Take 600 mg by mouth 3 (three) times daily.   HYDROXYCHLOROQUINE (PLAQUENIL) 200 MG  TABLET    TAKE 1 TABLET(200 MG) BY MOUTH DAILY   MELATONIN 3 MG TABS TABLET    Take by mouth.   MELOXICAM (MOBIC) 15 MG TABLET    Take 1 tablet (15 mg total) by mouth daily.   NORTRIPTYLINE (PAMELOR) 25 MG CAPSULE    Take 1 capsule (25 mg total) by mouth at bedtime.   QUETIAPINE (SEROQUEL) 100 MG TABLET    Take 100 mg by mouth daily.   VALACYCLOVIR (VALTREX) 500 MG TABLET    Take 500 mg by mouth daily.   VITAMIN D, ERGOCALCIFEROL, (DRISDOL) 1.25 MG (50000 UNIT) CAPS CAPSULE    Take 50,000 Units by mouth once a week.  Modified Medications   No medications on file  Discontinued Medications   No medications on file    Allergies: No Known Allergies  Past Medical History: Past Medical History:  Diagnosis Date   Aneurysm (HCC)    Arthritis    RA   Kidney stones    Lupus (HCC) 01/17/2021   Lupus (HCC)    Schizophrenia (HCC)    Systemic lupus erythematosus (HCC)     Social History: Social History   Socioeconomic History   Marital status: Single    Spouse name: Not on file   Number of children: Not on file   Years of education: Not on file   Highest education level: Not on file  Occupational History  Not on file  Tobacco Use   Smoking status: Former    Packs/day: 0.25    Years: 15.00    Total pack years: 3.75    Types: Cigarettes    Quit date: 07/15/2021    Years since quitting: 0.6   Smokeless tobacco: Never  Vaping Use   Vaping Use: Never used  Substance and Sexual Activity   Alcohol use: Not Currently   Drug use: Not Currently    Types: Marijuana    Comment: Last use was on 07/15/21   Sexual activity: Yes    Partners: Male    Birth control/protection: Surgical, Injection  Other Topics Concern   Not on file  Social History Narrative   Right handed   Social Determinants of Health   Financial Resource Strain: Not on file  Food Insecurity: Not on file  Transportation Needs: Not on file  Physical Activity: Not on file  Stress: Not on file  Social  Connections: Not on file    Labs: Lab Results  Component Value Date   HIV1RNAQUANT Not Detected 12/31/2021   HIV1RNAQUANT Not Detected 10/31/2021   HIV1RNAQUANT Not Detected 09/02/2021    RPR and STI Lab Results  Component Value Date   LABRPR REACTIVE (A) 06/25/2021   LABRPR REACTIVE (A) 12/18/2020   LABRPR Non Reactive 10/16/2019   RPRTITER 1:8 (H) 06/25/2021   RPRTITER 1:16 (H) 12/18/2020    STI Results GC GC CT CT  10/31/2021  9:06 AM Negative   Negative    09/02/2021  9:56 AM Negative   Negative    06/25/2021  8:33 AM Negative   Negative    01/30/2021 10:14 AM Negative   Negative    10/16/2019  3:39 PM Negative   Negative    05/09/2013 10:57 AM  NEGATIVE   NEGATIVE   12/07/2011 12:20 PM   NEGATIVE      Hepatitis B Lab Results  Component Value Date   HEPBSAB REACTIVE (A) 12/31/2021   HEPBSAG NON-REACTIVE 12/18/2020   HEPBCAB NON-REACTIVE 12/18/2020   Hepatitis C Lab Results  Component Value Date   HEPCAB NON-REACTIVE 12/18/2020   Hepatitis A No results found for: "HAV" Lipids: Lab Results  Component Value Date   CHOL 154 07/28/2019   TRIG 96 07/28/2019   HDL 57 07/28/2019   CHOLHDL 2.7 07/28/2019   LDLCALC 79 07/28/2019    TARGET DATE: The 3rd of the month  Assessment: Dominique [name] presents today for her Apretude injection and to follow up for HIV PrEP. No issues with past injections. Screened for acute HIV symptoms such as fatigue, muscle aches, rash, sore throat, lymphadenopathy, headache, night sweats, nausea/vomiting/diarrhea, and fever. Denies any symptoms. No known exposures to any STIs and no signs or symptoms of any STIs today. She was offered STI screening and condoms. She accepted the condoms but declined the STI screening for today.  Per [employer] guidelines, a rapid HIV test should be drawn prior to Apretude administration. Due to state shortage of rapid HIV tests, this is temporarily unable to be done. Per decision from [facility] physicians,  we will proceed with Apretude administration at this time without a negative rapid HIV test beforehand. HIV RNA was collected today and is in process.  She is eligible for the COVID and Tdap vaccines. After discussing the risks and benefits, She accepted the Tdap vaccine but declined the COVID vaccine today. Administered the Tdap vaccine in the right deltoid.  Administered cabotegravir 600mg /69mL in the right upper outer quadrant of  the gluteal muscle. Injection was tolerated well without issue. Will see her back in 2 months for injection, labs, and HIV PrEP follow up.  Plan: - Apretude injection administered - Administer the Tdap vaccine today - Collect HIV RNA today - Next injection, labs, and PrEP follow up appointment scheduled for 04/29/2022 with Dominique [name] - Call with any issues or questions  Jacolyn Reedy, Student Pharm-D [facility]

## 2022-02-25 ENCOUNTER — Ambulatory Visit (INDEPENDENT_AMBULATORY_CARE_PROVIDER_SITE_OTHER): Payer: Medicaid Other | Admitting: Pharmacist

## 2022-02-25 ENCOUNTER — Telehealth: Payer: Self-pay | Admitting: Internal Medicine

## 2022-02-25 ENCOUNTER — Other Ambulatory Visit: Payer: Self-pay

## 2022-02-25 DIAGNOSIS — Z23 Encounter for immunization: Secondary | ICD-10-CM

## 2022-02-25 DIAGNOSIS — Z79899 Other long term (current) drug therapy: Secondary | ICD-10-CM | POA: Diagnosis present

## 2022-02-25 DIAGNOSIS — Z113 Encounter for screening for infections with a predominantly sexual mode of transmission: Secondary | ICD-10-CM | POA: Diagnosis not present

## 2022-02-25 MED ORDER — CABOTEGRAVIR ER 600 MG/3ML IM SUER
600.0000 mg | Freq: Once | INTRAMUSCULAR | Status: AC
  Administered 2022-02-25: 600 mg via INTRAMUSCULAR

## 2022-02-25 NOTE — Telephone Encounter (Signed)
Patient called stating her Plaquenil eye exam is scheduled for 04/09/22 at [facility].  Patient states she is scheduled to see Dr. Dimple Casey on 03/11/22.  Patient requested a return call to let her know if she needs to reschedule her appointment until after her eye exam.

## 2022-02-25 NOTE — Telephone Encounter (Signed)
Attempted to contact the patient and left message to advise patient she does not need to reschedule her appointment for our office.

## 2022-02-28 LAB — HIV-1 RNA QUANT-NO REFLEX-BLD
HIV 1 RNA Quant: NOT DETECTED Copies/mL
HIV-1 RNA Quant, Log: NOT DETECTED Log cps/mL

## 2022-03-03 ENCOUNTER — Ambulatory Visit (HOSPITAL_COMMUNITY)
Admission: RE | Admit: 2022-03-03 | Discharge: 2022-03-03 | Disposition: A | Discharge status: Home / Self Care | Payer: Medicaid Other | Source: Ambulatory Visit | Attending: Interventional Radiology | Admitting: Interventional Radiology

## 2022-03-03 DIAGNOSIS — I671 Cerebral aneurysm, nonruptured: Secondary | ICD-10-CM | POA: Insufficient documentation

## 2022-03-10 ENCOUNTER — Telehealth (HOSPITAL_COMMUNITY): Payer: Self-pay

## 2022-03-10 NOTE — Telephone Encounter (Signed)
Pt agreed to f/u in 6 months with an [facility] head wo. AW

## 2022-03-11 ENCOUNTER — Ambulatory Visit: Payer: Medicaid Other | Attending: Internal Medicine | Admitting: Internal Medicine

## 2022-03-11 NOTE — Progress Notes (Deleted)
Office Visit Note  Patient: Dominique [name]             Date of Birth: November 23, 1975           MRN: [id number]             PCP: Dot Been, FNP Referring: Dot Been, FNP Visit Date: 03/11/2022   Subjective:  No chief complaint on file.   History of Present Illness: Dominique [name] is a 46 y.o. 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 Last visit noted continued pain with minimal synovitis appreciated and normal inflammatory markers. ***   Previous HPI 12/10/21 Dominique [name] is a 46 y.o. 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  Most symptoms are stable main complaint today is bilateral foot and ankle pain and swelling. No obvious difference off of HCQ. Pain is throughout the day describes as stabbing with pins type of sensation. She is seeing swelling accumulate usually by the evening and worse when sitting or standing in fixed position for a long time.     Previous HPI 01/23/21 Dominique [name] is a 46 y.o. female here for evaluation of positive ANA and elevated sedimentation rate with multiple systemic symptoms including weight loss, lymphadenopathy, paresthesias, joint pain, and headaches.  She started noticing hyperpigmented skin rashes developing on her face and extremities since several months ago does not recall specific onset or provoking episode.  Then developed worsening joint pain in multiple sites but started around July of this year.  This involved pain affecting both hands also the hips knees and feet bilaterally.  She described morning stiffness lasting 2 or 3 hours in duration also especially pain in the bottoms of the feet first and getting out of bed.  After a month with these ongoing and somewhat worsening symptoms she was seen in primary care clinic. Workup at that time positive for trichomoniasis and syphilis and starting antibiotics also found to have high sedimentation rate with  positive ANA and CCP Abs. She followed up for continued treatment last month and more recently in ID clinic with completion of antibiotic treatment for syphilis with Bicillin.  Neurology evaluation with lumbar puncture that was not suggestive for active neurosyphilis. She saw ophthalmology for visual change symptoms reports findings were not concerning for infectious or inflammatory type changes.   No Rheumatology ROS completed.   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   Right hip pain 05/16/2020   BRBPR (bright red blood per rectum) 10/18/2019   Tobacco use disorder 07/31/2019   Dyspnea, paroxysmal nocturnal 07/31/2019   Schizophrenia (HCC) 07/31/2019   Encounter for screening examination for sexually transmitted disease 07/31/2019   Abnormal uterine bleeding (AUB) 05/09/2013   BV (bacterial vaginosis) 05/09/2013   Dysmenorrhea 05/09/2013    Past Medical History:  Diagnosis Date   Aneurysm (HCC)    Arthritis    RA   Kidney stones    Lupus (HCC) 01/17/2021  Lupus (HCC)    Schizophrenia (HCC)    Systemic lupus erythematosus (HCC)     Family History  Problem Relation Age of Onset   Stroke Mother    Multiple sclerosis Father    Hypertension Father    Diabetes Father    Asthma Brother    Dementia Maternal Grandfather    Past Surgical History:  Procedure Laterality Date   CESAREAN SECTION     HERNIA REPAIR     IR 3D INDEPENDENT WKST  07/28/2021   IR ANGIO INTRA EXTRACRAN SEL INTERNAL CAROTID BILAT MOD SED   07/28/2021   IR ANGIO VERTEBRAL SEL VERTEBRAL UNI L MOD SED  07/28/2021   IR ANGIOGRAM FOLLOW UP STUDY  07/28/2021   IR CT HEAD LTD  07/28/2021   IR NEURO EACH ADD'L AFTER BASIC UNI LEFT (MS)  07/28/2021   IR RADIOLOGIST EVAL & MGMT  07/10/2021   IR RADIOLOGIST EVAL & MGMT  08/15/2021   IR TRANSCATH/EMBOLIZ  07/28/2021   IR US GUIDE VASC ACCESS RIGHT  07/28/2021   RADIOLOGY WITH ANESTHESIA N/A 07/28/2021   Procedure: EMBOLIZATION;  Surgeon: Julieanne Cotton, MD;  Location: [facility];  Service: Radiology;  Laterality: N/A;   Social History   Social History Narrative   Right handed   Immunization History  Administered Date(s) Administered   Hepb-cpg 06/25/2021, 09/02/2021   Influenza,inj,Quad PF,6+ Mos 01/30/2021, 12/31/2021   Tdap 02/25/2022     Objective: Vital Signs: There were no vitals taken for this visit.   Physical Exam   Musculoskeletal Exam: ***  CDAI Exam: CDAI Score: -- Patient Global: --; Provider Global: -- Swollen: --; Tender: -- Joint Exam 03/11/2022   No joint exam has been documented for this visit   There is currently no information documented on the homunculus. Go to the Rheumatology activity and complete the homunculus joint exam.  Investigation: No additional findings.  Imaging: MR BRAIN WO CONTRAST  Result Date: 03/04/2022 CLINICAL DATA:  Follow-up examination for aneurysm. EXAM: MRI HEAD WITHOUT CONTRAST MRA HEAD WITHOUT CONTRAST TECHNIQUE: Multiplanar, multi-echo pulse sequences of the brain and surrounding structures were acquired without intravenous contrast. Angiographic images of the Circle of Willis were acquired using MRA technique without intravenous contrast. COMPARISON:  Comparison made with prior arteriogram from 07/28/2021 as well as earlier studies. FINDINGS: MRI HEAD FINDINGS Brain: Cerebral volume stable, and remains within normal limits. Few scattered punctate foci of T2/FLAIR hyperintensity noted involving the supratentorial cerebral white matter,  nonspecific, but stable from prior, and minimal for age. Small DVA with associated susceptibility artifact involving the parasagittal left frontal lobe noted, stable. No evidence for acute or subacute infarct. Gray-white matter differentiation maintained. No areas of chronic cortical infarction. No other acute or chronic intracranial blood products. No mass lesion, midline shift or mass effect no hydrocephalus or extra-axial fluid collection. Pituitary gland suprasellar region within normal limits. Vascular: Major intracranial vascular flow voids are maintained. Skull and upper cervical spine: Craniocervical junction normal. Bone marrow signal intensity within normal limits. No scalp soft tissue abnormality. Sinuses/Orbits: Globes orbital soft tissues within normal limits. Paranasal sinuses are largely clear. No mastoid effusion. Other: None. MRA HEAD FINDINGS Anterior circulation: Both internal carotid arteries are widely patent to the termini without stenosis. A1 segments patent bilaterally. 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 No significant residual flow or evidence for recanalization. Patent flow within the underlying anterior communicating artery. Both ACAs widely  patent distally without stenosis. No M1 stenosis or occlusion. Normal MCA bifurcations. Distal MCA branches perfused and symmetric. Posterior circulation: Both V4 segments patent without stenosis. Right vertebral artery slightly dominant. Right PICA patent. Left PICA origin not seen. Basilar patent without stenosis. Superior cerebral arteries patent bilaterally. T stenosis. Small bilateral posterior communicating arteries noted. Anatomic variants: None significant. No new aneurysm. Left frontal DVA partially visualized. IMPRESSION: MRI HEAD IMPRESSION: Stable and normal brain MRI for age. No acute intracranial abnormality. MRA HEAD  IMPRESSION: 1. 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 No significant residual flow or evidence for recanalization. 2. Otherwise stable and normal intracranial MRA. Electronically Signed   By: Rise Mu M.D.   On: 03/04/2022 18:04   MR ANGIO HEAD WO CONTRAST  Result Date: 03/04/2022 CLINICAL DATA:  Follow-up examination for aneurysm. EXAM: MRI HEAD WITHOUT CONTRAST MRA HEAD WITHOUT CONTRAST TECHNIQUE: Multiplanar, multi-echo pulse sequences of the brain and surrounding structures were acquired without intravenous contrast. Angiographic images of the Circle of Willis were acquired using MRA technique without intravenous contrast. COMPARISON:  Comparison made with prior arteriogram from 07/28/2021 as well as earlier studies. FINDINGS: MRI HEAD FINDINGS Brain: Cerebral volume stable, and remains within normal limits. Few scattered punctate foci of T2/FLAIR hyperintensity noted involving the supratentorial cerebral white matter, nonspecific, but stable from prior, and minimal for age. Small DVA with associated susceptibility artifact involving the parasagittal left frontal lobe noted, stable. No evidence for acute or subacute infarct. Gray-white matter differentiation maintained. No areas of chronic cortical infarction. No other acute or chronic intracranial blood products. No mass lesion, midline shift or mass effect no hydrocephalus or extra-axial fluid collection. Pituitary gland suprasellar region within normal limits. Vascular: Major intracranial vascular flow voids are maintained. Skull and upper cervical spine: Craniocervical junction normal. Bone marrow signal intensity within normal limits. No scalp soft tissue abnormality. Sinuses/Orbits: Globes orbital soft tissues within normal limits. Paranasal sinuses are largely clear. No mastoid effusion. Other: None. MRA HEAD FINDINGS Anterior circulation: Both internal carotid arteries are widely patent to  the termini without stenosis. A1 segments patent bilaterally. 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 No significant residual flow or evidence for recanalization. Patent flow within the underlying anterior communicating artery. Both ACAs widely patent distally without stenosis. No M1 stenosis or occlusion. Normal MCA bifurcations. Distal MCA branches perfused and symmetric. Posterior circulation: Both V4 segments patent without stenosis. Right vertebral artery slightly dominant. Right PICA patent. Left PICA origin not seen. Basilar patent without stenosis. Superior cerebral arteries patent bilaterally. T stenosis. Small bilateral posterior communicating arteries noted. Anatomic variants: None significant. No new aneurysm. Left frontal DVA partially visualized. IMPRESSION: MRI HEAD IMPRESSION: Stable and normal brain MRI for age. No acute intracranial abnormality. MRA HEAD IMPRESSION: 1. 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 No significant residual flow or evidence for recanalization. 2. Otherwise stable and normal intracranial MRA. Electronically Signed   By: Rise Mu M.D.   On: 03/04/2022 18:04    Recent Labs: Lab Results  Component Value Date   WBC 4.9 12/10/2021   HGB 10.7 (L) 12/10/2021   PLT 177 12/10/2021   NA 138 12/10/2021   K 3.6 12/10/2021   CL 108 12/10/2021   CO2 24 12/10/2021   GLUCOSE 87 12/10/2021   BUN 6 (L) 12/10/2021   CREATININE 0.86 12/10/2021   BILITOT 0.4 12/10/2021  ALKPHOS 66 10/12/2021   AST 14 12/10/2021   ALT 12 12/10/2021   PROT 6.9 12/10/2021   ALBUMIN 4.1 10/12/2021   CALCIUM 9.3 12/10/2021   GFRAA 117 05/20/2020    Speciality Comments: LMOM for Dr. Laruth Bouchard office to fax PLQ eye exam 06/18/2021 shc  Procedures:  No procedures performed Allergies: Patient has no known allergies.   Assessment  / Plan:     Visit Diagnoses: No diagnosis found.  ***  Orders: No orders of the defined types were placed in this encounter.  No orders of the defined types were placed in this encounter.    Follow-Up Instructions: No follow-ups on file.   Fuller Plan, MD  Note - This record has been created using [employer].  Chart creation errors have been sought, but may not always  have been located. Such creation errors do not reflect on  the standard of medical care.

## 2022-04-03 ENCOUNTER — Other Ambulatory Visit (HOSPITAL_COMMUNITY): Payer: Self-pay

## 2022-04-06 ENCOUNTER — Telehealth: Payer: Self-pay

## 2022-04-06 NOTE — Telephone Encounter (Signed)
[facility] Patient Advocate Encounter  Patient's medication (Apretude) have been couriered to [facility] from [place] and will be administered on the patient next office visit on 04/29/22.  Ileene Patrick , [facility] Patient [facility] for Infectious Disease Phone: [phone] Fax:  [phone]

## 2022-04-07 ENCOUNTER — Other Ambulatory Visit (HOSPITAL_COMMUNITY): Payer: Self-pay

## 2022-04-09 ENCOUNTER — Other Ambulatory Visit (HOSPITAL_COMMUNITY): Payer: Self-pay

## 2022-04-14 NOTE — Progress Notes (Signed)
Office Visit Note  Patient: Dominique [name]             Date of Birth: July 13, 1975           MRN: [id number]             PCP: Joycelyn Man, FNP Referring: Joycelyn Man, FNP Visit Date: 04/15/2022   Subjective:  Follow-up (Fatigue)   History of Present Illness: Dominique [name] is a 47 y.o. female here for follow up for seronegative RA (ANA+) on hydroxychloroquine 200 mg daily and sulfasalazine 1000 mg twice daily.  No major flareups since her last visit.  Hyperpigmented skin changes mostly around the face may be the same or slightly worse.  Still with persistent foot and ankle pain.  Previous HPI 12/10/21 Dominique [name] is a 47 y.o. 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  Most symptoms are stable main complaint today is bilateral foot and ankle pain and swelling. No obvious difference off of HCQ. Pain is throughout the day describes as stabbing with pins type of sensation. She is seeing swelling accumulate usually by the evening and worse when sitting or standing in fixed position for a long time.    Previous HPI 09/12/2021 Dominique [name] is a 47 y.o. 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 She feels symptoms partially improved with less knee pain but continues to have every day pain in bilateral feet and ankles. Pain is first thing in the morning as well as worsening if she is on her feet all day. Feet swelling is noticeable with prolonged standing worse later in the day. She had coil embolization procedure uneventfully. She has noticed some progression of facial rash.   Previous HPI 07/21/21 Dominique [name] is a 47 y.o. 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 So far she still has about the same amount of pain worst affected areas in both heels and in mid feet. Plantar fasciitis exercises mildly helpful  for the bottom of the feet but not much. 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    Previous HPI 05/20/21 Dominique [name] is a 47 y.o. 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 So far her symptoms are about the same. She continues having pain worst in her feet and less extent along medial side of knees. She does not see much swelling or discoloration just pain. Has not noticed any side effects of the methotrexate.   Previous HPI 01/23/21 Dominique [name] is a 47 y.o. female here for evaluation of positive ANA and elevated sedimentation rate with multiple systemic symptoms including weight loss, lymphadenopathy, paresthesias, joint pain, and headaches.  She started noticing hyperpigmented skin rashes developing on her face and extremities since several months ago does not recall specific onset or provoking episode.  Then developed worsening joint pain in multiple sites but started around July of this year.  This involved pain affecting both hands also the hips knees and feet bilaterally.  She described morning stiffness lasting 2 or 3 hours in duration also especially pain in the bottoms of the feet first and getting out of bed.  After a  month with these ongoing and somewhat worsening symptoms she was seen in primary care clinic. Workup at that time positive for trichomoniasis and syphilis and starting antibiotics also found to have high sedimentation rate with positive ANA and CCP Abs. She followed up for continued treatment last month and more recently in ID clinic with completion of antibiotic treatment for syphilis with Bicillin.  Neurology evaluation with lumbar puncture that was not suggestive for active neurosyphilis. She saw ophthalmology for visual change symptoms reports findings were not concerning for infectious or inflammatory type changes.   Review of Systems   Constitutional:  Positive for fatigue.  HENT:  Negative for mouth sores and mouth dryness.   Eyes:  Negative for dryness.  Respiratory:  Positive for shortness of breath.   Cardiovascular:  Negative for chest pain and palpitations.  Gastrointestinal:  Negative for blood in stool, constipation and diarrhea.  Endocrine: Negative for increased urination.  Genitourinary:  Negative for involuntary urination.  Musculoskeletal:  Positive for joint pain, joint pain, joint swelling, myalgias, muscle weakness, morning stiffness, muscle tenderness and myalgias. Negative for gait problem.  Skin:  Positive for color change, hair loss and sensitivity to sunlight. Negative for rash.  Allergic/Immunologic: Positive for susceptible to infections.  Neurological:  Negative for dizziness and headaches.  Hematological:  Negative for swollen glands.  Psychiatric/Behavioral:  Positive for depressed mood and sleep disturbance. The patient is nervous/anxious.     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   Right hip pain 05/16/2020   BRBPR (bright red blood per rectum) 10/18/2019   Tobacco use disorder 07/31/2019   Dyspnea, paroxysmal nocturnal 07/31/2019   Schizophrenia (HCC) 07/31/2019   Encounter for screening examination for sexually transmitted disease 07/31/2019   Abnormal uterine bleeding (AUB) 05/09/2013   BV (bacterial vaginosis) 05/09/2013   Dysmenorrhea 05/09/2013    Past Medical History:  Diagnosis Date   Aneurysm (HCC)     Arthritis    RA   Kidney stones    Lupus (HCC) 01/17/2021   Lupus (HCC)    Schizophrenia (HCC)    Systemic lupus erythematosus (HCC)     Family History  Problem Relation Age of Onset   Stroke Mother    Multiple sclerosis Father    Hypertension Father    Diabetes Father    Asthma Brother    Dementia Maternal Grandfather    Past Surgical History:  Procedure Laterality Date   CESAREAN SECTION     HERNIA REPAIR     IR 3D INDEPENDENT WKST  07/28/2021   IR ANGIO INTRA EXTRACRAN SEL INTERNAL CAROTID BILAT MOD SED  07/28/2021   IR ANGIO VERTEBRAL SEL VERTEBRAL UNI L MOD SED  07/28/2021   IR ANGIOGRAM FOLLOW UP STUDY  07/28/2021   IR CT HEAD LTD  07/28/2021   IR NEURO EACH ADD'L AFTER BASIC UNI LEFT (MS)  07/28/2021   IR RADIOLOGIST EVAL & MGMT  07/10/2021   IR RADIOLOGIST EVAL & MGMT  08/15/2021   IR TRANSCATH/EMBOLIZ  07/28/2021   IR US GUIDE VASC ACCESS RIGHT  07/28/2021  RADIOLOGY WITH ANESTHESIA N/A 07/28/2021   Procedure: EMBOLIZATION;  Surgeon: Luanne Bras, MD;  Location: [place];  Service: Radiology;  Laterality: N/A;   Social History   Social History Narrative   Right handed   Immunization History  Administered Date(s) Administered   Hepb-cpg 06/25/2021, 09/02/2021   Influenza,inj,Quad PF,6+ Mos 01/30/2021, 12/31/2021   Tdap 02/25/2022     Objective: Vital Signs: BP 112/65 (BP Location: Right Arm, Patient Position: Sitting, Cuff Size: Normal)   Pulse 88   Resp 14   Ht 5' (1.524 m)   Wt 120 lb (54.4 kg)   BMI 23.44 kg/m    Physical Exam Cardiovascular:     Rate and Rhythm: Normal rate and regular rhythm.  Pulmonary:     Effort: Pulmonary effort is normal.     Breath sounds: Normal breath sounds.  Lymphadenopathy:     Cervical: No cervical adenopathy.  Skin:    General: Skin is warm and dry.  Neurological:     Mental Status: She is alert.  Psychiatric:        Mood and Affect: Mood normal.      Musculoskeletal Exam:  Shoulders full ROM no tenderness or  swelling Elbows full ROM no tenderness or swelling Wrists full ROM no tenderness or swelling Fingers full ROM no tenderness or swelling No paraspinal tenderness to palpation over upper and lower back Hip normal internal and external rotation without pain, no tenderness to lateral hip palpation Knees full ROM no tenderness or swelling Mild tenderness to pressure from slightly above the ankle extending throughout the feet, no visible swelling or erythema ankle and toe range of motion is normal   Investigation: No additional findings.  Imaging: No results found.  Recent Labs: Lab Results  Component Value Date   WBC 5.8 04/15/2022   HGB 11.0 (L) 04/15/2022   PLT 212 04/15/2022   NA 137 04/15/2022   K 3.3 (L) 04/15/2022   CL 102 04/15/2022   CO2 26 04/15/2022   GLUCOSE 83 04/15/2022   BUN 6 (L) 04/15/2022   CREATININE 0.70 04/15/2022   BILITOT 0.4 04/15/2022   ALKPHOS 66 10/12/2021   AST 16 04/15/2022   ALT 14 04/15/2022   PROT 7.8 04/15/2022   ALBUMIN 4.1 10/12/2021   CALCIUM 9.7 04/15/2022   GFRAA 117 05/20/2020    Speciality Comments: LMOM for Dr. Zenia Resides office to fax PLQ eye exam 06/18/2021 shc  Procedures:  No procedures performed Allergies: Patient has no known allergies.   Assessment / Plan:     Visit Diagnoses: Seronegative rheumatoid arthritis ([place]) - Plan: Sedimentation rate, C-reactive protein  Arthritis symptoms appear pretty well-controlled there is no peripheral joint swelling on exam today.  Will recheck some history and CRP for disease activity monitoring.  Plan to continue the hydroxychloroquine 200 mg daily and sulfasalazine 1000 mg BID.  High risk medication use - Plan: CBC with Differential/Platelet, COMPLETE METABOLIC PANEL WITH GFR  Checking CBC and CMP for medication monitoring on continued treatment with sulfasalazine.  Previous hydroxychloroquine eye exam from March last year was okay provide needs to schedule for repeat exam this year has seen  Dr. Katy Fitch for this.  Chronic pain of both ankles  Persistent discomfort in her feet and ankles.  With the distribution extending all the way to her toes also question if this is more neuropathy pain than musculoskeletal problem at this point. Already on gabapentin 600mg  TID.  Orders: Orders Placed This Encounter  Procedures   Sedimentation rate  CBC with Differential/Platelet   COMPLETE METABOLIC PANEL WITH GFR   C-reactive protein   No orders of the defined types were placed in this encounter.    Follow-Up Instructions: Return in about 3 months (around 07/15/2022) for RA on HCQ/SSZ f/u 33mos.   Collier Salina, MD  Note - This record has been created using [employer].  Chart creation errors have been sought, but may not always  have been located. Such creation errors do not reflect on  the standard of medical care.

## 2022-04-15 ENCOUNTER — Ambulatory Visit: Payer: Medicaid Other | Attending: Internal Medicine | Admitting: Internal Medicine

## 2022-04-15 ENCOUNTER — Other Ambulatory Visit: Payer: Self-pay | Admitting: Internal Medicine

## 2022-04-15 ENCOUNTER — Encounter: Payer: Self-pay | Admitting: Internal Medicine

## 2022-04-15 VITALS — BP 112/65 | HR 88 | Resp 14 | Ht 60.0 in | Wt 120.0 lb

## 2022-04-15 DIAGNOSIS — Z79899 Other long term (current) drug therapy: Secondary | ICD-10-CM | POA: Diagnosis present

## 2022-04-15 DIAGNOSIS — G8929 Other chronic pain: Secondary | ICD-10-CM | POA: Insufficient documentation

## 2022-04-15 DIAGNOSIS — M0600 Rheumatoid arthritis without rheumatoid factor, unspecified site: Secondary | ICD-10-CM | POA: Diagnosis present

## 2022-04-15 DIAGNOSIS — M25571 Pain in right ankle and joints of right foot: Secondary | ICD-10-CM | POA: Insufficient documentation

## 2022-04-15 DIAGNOSIS — M25572 Pain in left ankle and joints of left foot: Secondary | ICD-10-CM | POA: Diagnosis present

## 2022-04-16 LAB — CBC WITH DIFFERENTIAL/PLATELET
Absolute Monocytes: 400 cells/uL (ref 200–950)
Basophils Absolute: 17 cells/uL (ref 0–200)
Basophils Relative: 0.3 %
Eosinophils Absolute: 17 cells/uL (ref 15–500)
Eosinophils Relative: 0.3 %
HCT: 31.6 % — ABNORMAL LOW (ref 35.0–45.0)
Hemoglobin: 11 g/dL — ABNORMAL LOW (ref 11.7–15.5)
Lymphs Abs: 2981 cells/uL (ref 850–3900)
MCH: 32.3 pg (ref 27.0–33.0)
MCHC: 34.8 g/dL (ref 32.0–36.0)
MCV: 92.7 fL (ref 80.0–100.0)
MPV: 10.3 fL (ref 7.5–12.5)
Monocytes Relative: 6.9 %
Neutro Abs: 2384 cells/uL (ref 1500–7800)
Neutrophils Relative %: 41.1 %
Platelets: 212 10*3/uL (ref 140–400)
RBC: 3.41 10*6/uL — ABNORMAL LOW (ref 3.80–5.10)
RDW: 12.8 % (ref 11.0–15.0)
Total Lymphocyte: 51.4 %
WBC: 5.8 10*3/uL (ref 3.8–10.8)

## 2022-04-16 LAB — SEDIMENTATION RATE: Sed Rate: 19 mm/h (ref 0–20)

## 2022-04-16 LAB — COMPLETE METABOLIC PANEL WITH GFR
AG Ratio: 1.5 (calc) (ref 1.0–2.5)
ALT: 14 U/L (ref 6–29)
AST: 16 U/L (ref 10–35)
Albumin: 4.7 g/dL (ref 3.6–5.1)
Alkaline phosphatase (APISO): 55 U/L (ref 31–125)
BUN/Creatinine Ratio: 9 (calc) (ref 6–22)
BUN: 6 mg/dL — ABNORMAL LOW (ref 7–25)
CO2: 26 mmol/L (ref 20–32)
Calcium: 9.7 mg/dL (ref 8.6–10.2)
Chloride: 102 mmol/L (ref 98–110)
Creat: 0.7 mg/dL (ref 0.50–0.99)
Globulin: 3.1 g/dL (calc) (ref 1.9–3.7)
Glucose, Bld: 83 mg/dL (ref 65–99)
Potassium: 3.3 mmol/L — ABNORMAL LOW (ref 3.5–5.3)
Sodium: 137 mmol/L (ref 135–146)
Total Bilirubin: 0.4 mg/dL (ref 0.2–1.2)
Total Protein: 7.8 g/dL (ref 6.1–8.1)
eGFR: 109 mL/min/{1.73_m2} (ref >=60)

## 2022-04-16 LAB — C-REACTIVE PROTEIN: CRP: <0.2 mg/L (ref <8.0)

## 2022-04-16 NOTE — Progress Notes (Signed)
Lab results look fine for continuing the sulfasalazine as planned. Inflammatory markers are normal so does not indicate severe thyroiditis but does not rule this out. Her potassium is slightly low at 3.3 this may not be significant but can sometimes contribute to muscle cramps or weakness. Is she experiencing any recent vomiting or diarrhea or decrease in appetite?

## 2022-04-29 ENCOUNTER — Other Ambulatory Visit: Payer: Self-pay

## 2022-04-29 ENCOUNTER — Other Ambulatory Visit (HOSPITAL_COMMUNITY)
Admission: RE | Admit: 2022-04-29 | Discharge: 2022-04-29 | Disposition: A | Discharge status: Home / Self Care | Payer: Medicaid Other | Source: Ambulatory Visit | Attending: Infectious Disease | Admitting: Infectious Disease

## 2022-04-29 ENCOUNTER — Ambulatory Visit: Payer: Medicaid Other | Admitting: Pharmacist

## 2022-04-29 DIAGNOSIS — Z113 Encounter for screening for infections with a predominantly sexual mode of transmission: Secondary | ICD-10-CM | POA: Diagnosis not present

## 2022-04-29 DIAGNOSIS — Z79899 Other long term (current) drug therapy: Secondary | ICD-10-CM

## 2022-04-29 DIAGNOSIS — Z2981 Encounter for HIV pre-exposure prophylaxis: Secondary | ICD-10-CM

## 2022-04-29 MED ORDER — CABOTEGRAVIR ER 600 MG/3ML IM SUER
600.0000 mg | Freq: Once | INTRAMUSCULAR | Status: AC
  Administered 2022-04-29: 600 mg via INTRAMUSCULAR

## 2022-04-29 NOTE — Progress Notes (Signed)
HPI: Dominique [name] is a 47 y.o. female who presents to the [facility] for Apretude administration and HIV PrEP follow up.  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   CAPSAICIN (ZOSTRIX) 0.025 % CREAM    Apply 1 application. topically 3 (three) times daily.   CETIRIZINE (ZYRTEC) 10 MG TABLET    Take 10 mg by mouth daily.   CITALOPRAM (CELEXA) 20 MG TABLET    Take by mouth.   CLOPIDOGREL (PLAVIX) 75 MG TABLET    Take 0.5 tablets (37.5 mg total) by mouth daily.   DEPO-SUBQ PROVERA 104 104 MG/0.65ML INJECTION    every 3 (three) months.   DICLOFENAC SODIUM (VOLTAREN) 1 % GEL    Apply 2 g topically 4 (four) times daily.   FAMOTIDINE (PEPCID) 20 MG TABLET    Take 20 mg by mouth at bedtime.   FOLIC ACID (FOLVITE) 1 MG TABLET    Take 1 tablet (1 mg total) by mouth daily.   GABAPENTIN (NEURONTIN) 600 MG TABLET    Take 600 mg by mouth 3 (three)  times daily.   HYDROXYCHLOROQUINE (PLAQUENIL) 200 MG TABLET    TAKE 1 TABLET(200 MG) BY MOUTH DAILY   MELATONIN 3 MG TABS TABLET    Take by mouth.   MELOXICAM (MOBIC) 15 MG TABLET    Take 1 tablet (15 mg total) by mouth daily.   NORTRIPTYLINE (PAMELOR) 25 MG CAPSULE    Take 1 capsule (25 mg total) by mouth at bedtime.   QUETIAPINE (SEROQUEL) 100 MG TABLET    Take 100 mg by mouth daily.   SULFASALAZINE (AZULFIDINE) 500 MG EC TABLET    Take 1,000 mg by mouth 2 (two) times daily.   VALACYCLOVIR (VALTREX) 500 MG TABLET    Take 500 mg by mouth daily.   VITAMIN D, ERGOCALCIFEROL, (DRISDOL) 1.25 MG (50000 UNIT) CAPS CAPSULE    Take 50,000 Units by mouth once a week.  Modified Medications   No medications on file  Discontinued Medications   No medications on file    Allergies: No Known Allergies  Past Medical History: Past Medical History:  Diagnosis Date   Aneurysm ([place])    Arthritis    RA   Kidney stones    Lupus ([place]) 01/17/2021   Lupus ([place])    Schizophrenia (HCC)    Systemic lupus erythematosus ([place])     Social History: Social History   Socioeconomic History   Marital status: Single  Spouse name: Not on file   Number of children: Not on file   Years of education: Not on file   Highest education level: Not on file  Occupational History   Not on file  Tobacco Use   Smoking status: Former    Packs/day: 0.25    Years: 15.00    Total pack years: 3.75    Types: Cigarettes    Quit date: 07/15/2021    Years since quitting: 0.7    Passive exposure: Past   Smokeless tobacco: Never  Vaping Use   Vaping Use: Never used  Substance and Sexual Activity   Alcohol use: Not Currently   Drug use: Not Currently    Types: Marijuana    Comment: Last use was on 07/15/21   Sexual activity: Yes    Partners: Male    Birth control/protection: Surgical, Injection  Other Topics Concern   Not on file  Social History Narrative   Right handed   Social Determinants of Health    Financial Resource Strain: Not on file  Food Insecurity: Not on file  Transportation Needs: Not on file  Physical Activity: Not on file  Stress: Not on file  Social Connections: Not on file    Labs: Lab Results  Component Value Date   HIV1RNAQUANT Not Detected 02/25/2022   HIV1RNAQUANT Not Detected 12/31/2021   HIV1RNAQUANT Not Detected 10/31/2021    RPR and STI Lab Results  Component Value Date   LABRPR REACTIVE (A) 06/25/2021   LABRPR REACTIVE (A) 12/18/2020   LABRPR Non Reactive 10/16/2019   RPRTITER 1:8 (H) 06/25/2021   RPRTITER 1:16 (H) 12/18/2020    STI Results GC GC CT CT  10/31/2021  9:06 AM Negative   Negative    09/02/2021  9:56 AM Negative   Negative    06/25/2021  8:33 AM Negative   Negative    01/30/2021 10:14 AM Negative   Negative    10/16/2019  3:39 PM Negative   Negative    05/09/2013 10:57 AM  NEGATIVE   NEGATIVE   12/07/2011 12:20 PM   NEGATIVE      Hepatitis B Lab Results  Component Value Date   HEPBSAB REACTIVE (A) 12/31/2021   HEPBSAG NON-REACTIVE 12/18/2020   HEPBCAB NON-REACTIVE 12/18/2020   Hepatitis C Lab Results  Component Value Date   HEPCAB NON-REACTIVE 12/18/2020   Hepatitis A No results found for: "HAV" Lipids: Lab Results  Component Value Date   CHOL 154 07/28/2019   TRIG 96 07/28/2019   HDL 57 07/28/2019   CHOLHDL 2.7 07/28/2019   LDLCALC 79 07/28/2019    TARGET DATE: The 3rd of the month  Current PrEP Regimen: Apretude  Assessment: Callee presents today for their Apretude injection and to follow up for HIV PrEP. No issues with past injections. No known exposures to any STIs and no signs or symptoms of any STIs today. Screened patient for acute HIV symptoms such as fatigue, muscle aches, rash, sore throat, lymphadenopathy, headache, night sweats, nausea/vomiting/diarrhea, and fever. Patient denies any symptoms. No new partners since last injection; will check urine cytologies and RPR today.  Per [employer]  guidelines, a rapid HIV test should be drawn prior to Apretude administration. Due to state shortage of rapid HIV tests, this is temporarily unable to be done. Per decision from [place], we will proceed with Apretude administration at this time without a negative rapid HIV test beforehand. HIV RNA was collected today and is in process.  Administered cabotegravir 600mg /47mL in  right upper outer quadrant of the gluteal muscle. Will make follow up appointments for maintenance injections every 2 months.   Plan: - Maintenance injections scheduled for 3/27 with me  - Check HIV RNA, RPR, and urine cytologies  - Call with any issues or questions  Alfonse Spruce, PharmD, CPP, BCIDP, [place] Clinical Pharmacist Practitioner Infectious [facility]

## 2022-04-30 LAB — URINE CYTOLOGY ANCILLARY ONLY
Chlamydia: NEGATIVE
Comment: NEGATIVE
Comment: NORMAL
Neisseria Gonorrhea: NEGATIVE

## 2022-05-02 LAB — T PALLIDUM AB: T Pallidum Abs: POSITIVE — AB

## 2022-05-02 LAB — HIV-1 RNA QUANT-NO REFLEX-BLD
HIV 1 RNA Quant: NOT DETECTED Copies/mL
HIV-1 RNA Quant, Log: NOT DETECTED Log cps/mL

## 2022-05-02 LAB — RPR TITER: RPR Titer: 1:2 {titer} — ABNORMAL HIGH

## 2022-05-02 LAB — RPR: RPR Ser Ql: REACTIVE — AB

## 2022-05-14 ENCOUNTER — Other Ambulatory Visit: Payer: Self-pay | Admitting: Internal Medicine

## 2022-05-14 DIAGNOSIS — R21 Rash and other nonspecific skin eruption: Secondary | ICD-10-CM

## 2022-05-14 DIAGNOSIS — R768 Other specified abnormal immunological findings in serum: Secondary | ICD-10-CM

## 2022-05-14 DIAGNOSIS — M2550 Pain in unspecified joint: Secondary | ICD-10-CM

## 2022-05-14 NOTE — Telephone Encounter (Signed)
Next Visit: 07/15/2022  Last Visit: 04/15/2022  Labs: 05/12/2022 BUN 5, BUN/creatinine ration 6, RBC 3.4, Hemoglobin 10.7, Hematocrit 30.8  Eye exam: 06/18/2021   Current Dose per office note 04/15/2022: hydroxychloroquine 200 mg daily   [id number] rheumatoid arthritis   Last Fill: 02/17/2022  Okay to refill Plaquenil?

## 2022-06-11 DIAGNOSIS — G629 Polyneuropathy, unspecified: Secondary | ICD-10-CM | POA: Insufficient documentation

## 2022-06-12 ENCOUNTER — Other Ambulatory Visit (HOSPITAL_COMMUNITY): Payer: Self-pay

## 2022-06-16 ENCOUNTER — Other Ambulatory Visit: Payer: Self-pay

## 2022-06-18 ENCOUNTER — Telehealth: Payer: Self-pay

## 2022-06-18 NOTE — Telephone Encounter (Signed)
[facility] Patient Advocate Encounter  Patient's medication (Apretude) have been couriered to [facility] from [facility] and will be administered on the patient next office visit on 06/24/22.  Ileene Patrick , [facility] Patient [facility] Phone: [phone] Fax:  [phone]

## 2022-06-23 ENCOUNTER — Ambulatory Visit: Payer: Medicaid Other

## 2022-06-24 ENCOUNTER — Other Ambulatory Visit: Payer: Self-pay

## 2022-06-24 ENCOUNTER — Ambulatory Visit (INDEPENDENT_AMBULATORY_CARE_PROVIDER_SITE_OTHER): Payer: Medicaid Other | Admitting: Pharmacist

## 2022-06-24 DIAGNOSIS — Z113 Encounter for screening for infections with a predominantly sexual mode of transmission: Secondary | ICD-10-CM

## 2022-06-24 DIAGNOSIS — Z2981 Encounter for HIV pre-exposure prophylaxis: Secondary | ICD-10-CM

## 2022-06-24 DIAGNOSIS — Z79899 Other long term (current) drug therapy: Secondary | ICD-10-CM

## 2022-06-24 MED ORDER — CABOTEGRAVIR ER 600 MG/3ML IM SUER
600.0000 mg | Freq: Once | INTRAMUSCULAR | Status: AC
  Administered 2022-06-24: 600 mg via INTRAMUSCULAR

## 2022-06-24 NOTE — Progress Notes (Signed)
HPI: Dominique [name] is a 47 y.o. female who presents to the [facility] clinic for Apretude administration and HIV PrEP follow up.  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   Right hip pain 05/16/2020   BRBPR (bright red blood per rectum) 10/18/2019   Tobacco use disorder 07/31/2019   Dyspnea, paroxysmal nocturnal 07/31/2019   Schizophrenia ([place]) 07/31/2019   Encounter for screening examination for sexually transmitted disease 07/31/2019   Abnormal uterine bleeding (AUB) 05/09/2013   BV (bacterial vaginosis) 05/09/2013   Dysmenorrhea 05/09/2013    Patient's Medications  New Prescriptions   No medications on file  Previous Medications   CABOTEGRAVIR ER (APRETUDE) 600 MG/3ML INJECTION    Inject 3 mLs (600 mg total) into the muscle every 2 (two) months.   CAPSAICIN (ZOSTRIX) 0.025 % CREAM    Apply 1 application. topically 3 (three) times daily.   CETIRIZINE (ZYRTEC) 10 MG TABLET    Take 10 mg by mouth daily.   CLOPIDOGREL (PLAVIX) 75 MG TABLET    Take 0.5 tablets (37.5 mg total) by mouth daily.   DEPO-SUBQ PROVERA 104 104 MG/0.65ML INJECTION    every 3 (three) months.   DICLOFENAC SODIUM (VOLTAREN) 1 % GEL    Apply 2 g topically 4 (four) times daily.   FAMOTIDINE (PEPCID) 20 MG TABLET    Take 20 mg by mouth at bedtime.   FOLIC ACID (FOLVITE) 1 MG TABLET    Take 1 tablet (1 mg total) by mouth daily.   GABAPENTIN (NEURONTIN) 600 MG TABLET    Take 600 mg by mouth 3 (three) times daily.    HYDROXYCHLOROQUINE (PLAQUENIL) 200 MG TABLET    TAKE 1 TABLET(200 MG) BY MOUTH DAILY   MELATONIN 3 MG TABS TABLET    Take by mouth.   MELOXICAM (MOBIC) 15 MG TABLET    Take 1 tablet (15 mg total) by mouth daily.   NORTRIPTYLINE (PAMELOR) 25 MG CAPSULE    Take 1 capsule (25 mg total) by mouth at bedtime.   QUETIAPINE (SEROQUEL) 100 MG TABLET    Take 100 mg by mouth daily.   SULFASALAZINE (AZULFIDINE) 500 MG EC TABLET    Take 1,000 mg by mouth 2 (two) times daily.   VALACYCLOVIR (VALTREX) 500 MG TABLET    Take 500 mg by mouth daily.   VITAMIN D, ERGOCALCIFEROL, (DRISDOL) 1.25 MG (50000 UNIT) CAPS CAPSULE    Take 50,000 Units by mouth once a week.  Modified Medications   No medications on file  Discontinued Medications   No medications on file    Allergies: No Known Allergies  Past Medical History: Past Medical History:  Diagnosis Date   Aneurysm ([place])    Arthritis    RA   Kidney stones    Lupus ([place]) 01/17/2021   Lupus ([place])    Schizophrenia (HCC)    Systemic lupus erythematosus ([place])     Social History: Social History   Socioeconomic History   Marital status: Single    Spouse  name: Not on file   Number of children: Not on file   Years of education: Not on file   Highest education level: Not on file  Occupational History   Not on file  Tobacco Use   Smoking status: Former    Packs/day: 0.25    Years: 15.00    Additional pack years: 0.00    Total pack years: 3.75    Types: Cigarettes    Quit date: 07/15/2021    Years since quitting: 0.9    Passive exposure: Past   Smokeless tobacco: Never  Vaping Use   Vaping Use: Never used  Substance and Sexual Activity   Alcohol use: Not Currently   Drug use: Not Currently    Types: Marijuana    Comment: Last use was on 07/15/21   Sexual activity: Yes    Partners: Male    Birth control/protection: Surgical, Injection  Other Topics Concern   Not on file  Social History Narrative   Right handed   Social Determinants of  Health   Financial Resource Strain: Not on file  Food Insecurity: Not on file  Transportation Needs: Not on file  Physical Activity: Not on file  Stress: Not on file  Social Connections: Not on file    Labs: Lab Results  Component Value Date   HIV1RNAQUANT Not Detected 04/29/2022   HIV1RNAQUANT Not Detected 02/25/2022   HIV1RNAQUANT Not Detected 12/31/2021    RPR and STI Lab Results  Component Value Date   LABRPR REACTIVE (A) 04/29/2022   LABRPR REACTIVE (A) 06/25/2021   LABRPR REACTIVE (A) 12/18/2020   LABRPR Non Reactive 10/16/2019   RPRTITER 1:2 (H) 04/29/2022   RPRTITER 1:8 (H) 06/25/2021   RPRTITER 1:16 (H) 12/18/2020    STI Results GC GC CT CT  04/29/2022 10:00 AM Negative   Negative    10/31/2021  9:06 AM Negative   Negative    09/02/2021  9:56 AM Negative   Negative    06/25/2021  8:33 AM Negative   Negative    01/30/2021 10:14 AM Negative   Negative    10/16/2019  3:39 PM Negative   Negative    05/09/2013 10:57 AM  NEGATIVE   NEGATIVE   12/07/2011 12:20 PM   NEGATIVE      Hepatitis B Lab Results  Component Value Date   HEPBSAB REACTIVE (A) 12/31/2021   HEPBSAG NON-REACTIVE 12/18/2020   HEPBCAB NON-REACTIVE 12/18/2020   Hepatitis C Lab Results  Component Value Date   HEPCAB NON-REACTIVE 12/18/2020   Hepatitis A No results found for: "HAV" Lipids: Lab Results  Component Value Date   CHOL 154 07/28/2019   TRIG 96 07/28/2019   HDL 57 07/28/2019   CHOLHDL 2.7 07/28/2019   [place] 79 07/28/2019    TARGET DATE: The 3rd   Assessment: Dominique [name] presents today for her Apretude injection and to follow up for HIV PrEP. No issues with past injections. Screened for acute HIV symptoms such as fatigue, muscle aches, rash, sore throat, lymphadenopathy, headache, night sweats, nausea/vomiting/diarrhea, and fever. Will get an HIV RNA today as well as HepA titers. Denies any symptoms. No known exposures to any STIs since last visit. She agrees to STI testing  today with RPR and oral/urine cytologies.   Per [employer] guidelines, a rapid HIV test should be drawn prior to Apretude administration. Due to state shortage of rapid HIV tests, this is temporarily unable to be done. Per decision from [place], we will proceed with Apretude administration at  this time without a negative rapid HIV test beforehand. HIV RNA was collected today and is in process.  Administered cabotegravir 600mg /78mL in left upper outer quadrant of the gluteal muscle. Will see her back in 2 months for injection, labs, and HIV PrEP follow up.  Plan: - Apretude injection administered - HIV RNA, HepA titers, RPR, oral/urine cytologies today - Next injection, labs, and PrEP follow up appointment scheduled for 08/26/22 with Dominique [name] - Call with any issues or questions   Billey Gosling, PharmD PGY1 Pharmacy Resident 3/27/202410:30 AM

## 2022-06-25 ENCOUNTER — Ambulatory Visit: Payer: Medicaid Other | Attending: Pain Medicine

## 2022-06-25 DIAGNOSIS — R2689 Other abnormalities of gait and mobility: Secondary | ICD-10-CM | POA: Insufficient documentation

## 2022-06-25 DIAGNOSIS — R262 Difficulty in walking, not elsewhere classified: Secondary | ICD-10-CM | POA: Diagnosis not present

## 2022-06-25 DIAGNOSIS — M6281 Muscle weakness (generalized): Secondary | ICD-10-CM | POA: Diagnosis present

## 2022-06-25 LAB — CYTOLOGY, (ORAL, ANAL, URETHRAL) ANCILLARY ONLY
Chlamydia: NEGATIVE
Comment: NEGATIVE
Comment: NORMAL
Neisseria Gonorrhea: NEGATIVE

## 2022-06-25 LAB — URINE CYTOLOGY ANCILLARY ONLY
Chlamydia: NEGATIVE
Comment: NEGATIVE
Comment: NORMAL
Neisseria Gonorrhea: NEGATIVE

## 2022-06-25 NOTE — Therapy (Signed)
OUTPATIENT PHYSICAL THERAPY NEURO EVALUATION   Patient Name: Dominique [name] MRN: [id number] DOB:1976-02-13, 47 y.o., female Today's Date: 06/25/2022   PCP: Joycelyn Man, FNP REFERRING PROVIDER: Alden Server, MD  END OF SESSION:  PT End of Session - 06/25/22 1556     Visit Number 1    Number of Visits 8    Date for PT Re-Evaluation 09/17/22    Authorization Type Medicaid    Authorization Time Period auth required, submitted via Medicaid website    Authorization - Visit Number 1    Authorization - Number of Visits 3    Progress Note Due on Visit 3    PT Start Time [id number]    PT Stop Time 1700    PT Time Calculation (min) 45 min             Past Medical History:  Diagnosis Date   Aneurysm ([place])    Arthritis    RA   Kidney stones    Lupus ([place]) 01/17/2021   Lupus ([place])    Schizophrenia ([place])    Systemic lupus erythematosus ([place])    Past Surgical History:  Procedure Laterality Date   CESAREAN SECTION     HERNIA REPAIR     IR 3D INDEPENDENT WKST  07/28/2021   IR ANGIO INTRA EXTRACRAN SEL INTERNAL CAROTID BILAT MOD SED  07/28/2021   IR ANGIO VERTEBRAL SEL VERTEBRAL UNI L MOD SED  07/28/2021   IR ANGIOGRAM FOLLOW UP STUDY  07/28/2021   IR CT HEAD LTD  07/28/2021   IR NEURO EACH ADD'L AFTER BASIC UNI LEFT (MS)  07/28/2021   IR RADIOLOGIST EVAL & MGMT  07/10/2021   IR RADIOLOGIST EVAL & MGMT  08/15/2021   IR TRANSCATH/EMBOLIZ  07/28/2021   IR US GUIDE VASC ACCESS RIGHT  07/28/2021   RADIOLOGY WITH ANESTHESIA N/A 07/28/2021   Procedure: EMBOLIZATION;  Surgeon: Luanne Bras, MD;  Location: [facility];  Service: Radiology;  Laterality: N/A;   Patient Active Problem List   Diagnosis Date Noted   Seronegative rheumatoid arthritis ([place]) 12/13/2021   Brain aneurysm 07/28/2021   Bilateral ankle pain 05/20/2021   High risk medication use 04/21/2021   Herpes 01/23/2021   Polyarthralgia 01/23/2021   Rash and other nonspecific skin eruption 01/23/2021   Undifferentiated  connective tissue disease ([place]) 01/17/2021   Neurosyphilis in adult 12/18/2020   Blurry vision, bilateral 12/18/2020   Smoking 12/18/2020   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   Right hip pain 05/16/2020   BRBPR (bright red blood per rectum) 10/18/2019   Tobacco use disorder 07/31/2019   Dyspnea, paroxysmal nocturnal 07/31/2019   Schizophrenia ([place]) 07/31/2019   Encounter for screening examination for sexually transmitted disease 07/31/2019   Abnormal uterine bleeding (AUB) 05/09/2013   BV (bacterial vaginosis) 05/09/2013   Dysmenorrhea 05/09/2013    ONSET DATE: 2 years ago for lupus dx, neuropathy around 1.5 years ago  REFERRING DIAG: M79.2 (ICD-10-CM) - Neuralgia and neuritis, unspecified  THERAPY DIAG:  Difficulty in walking, not elsewhere classified  Muscle weakness (generalized)  Other abnormalities of gait and mobility  Rationale for Evaluation and Treatment: Rehabilitation  SUBJECTIVE:  SUBJECTIVE STATEMENT: Dx of lupus around 2 years ago and has been experiencing neuropathic pain in bilateral LE x 1.5 years. Right more affected than left. 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 Yes: NPRS scale: 10/10 Pain location: right and left anterior leg/thigh Pain description: pins, needles, charlie horse/cramping Aggravating factors: walking, standing Relieving factors: voltaren gel, heating pad at night  PRECAUTIONS: None  WEIGHT BEARING RESTRICTIONS: No  FALLS: Has patient fallen in last 6 months? No  LIVING ENVIRONMENT: Lives  with: lives with their family Lives in: House/apartment Stairs: Yes: External: 4 steps; yes Has following equipment at home: None  PLOF:  pt reports she has a nurse that will be coming to assist with ADL/housekeeping  PATIENT GOALS: reduce pain  OBJECTIVE:   DIAGNOSTIC FINDINGS:   COGNITION: Overall cognitive status: Within functional limits for tasks assessed   SENSATION: Notes general sensory disturbance   COORDINATION: WFL  EDEMA:  None present  MUSCLE TONE: WNL  MUSCLE LENGTH: WNL     POSTURE: No Significant postural limitations  LOWER EXTREMITY ROM:     Active  Right Eval Left Eval  Hip flexion    Hip extension    Hip abduction    Hip adduction    Hip internal rotation    Hip external rotation    Knee flexion 100 100  Knee extension 0 0  Ankle dorsiflexion    Ankle plantarflexion    Ankle inversion    Ankle eversion     (Blank rows = not tested)  Knee flexion limited by pain (stretch feeling along anterior thigh)  LOWER EXTREMITY MMT:    Grossly 5/5 to seated resisted tests  BED MOBILITY:  independent  TRANSFERS: Assistive device utilized: None  Sit to stand: Complete Independence Stand to sit: Complete Independence Chair to chair: Complete Independence Floor:  NT    STAIRS: Level of Assistance: Modified independence Stair Negotiation Technique: Alternating Pattern  with Single Rail on Right Number of Stairs: 12  Height of Stairs: 4-6 inches  Comments:   GAIT: Gait pattern: antalgic Distance walked:  Assistive device utilized: None Level of assistance: Complete Independence Comments: decreased speed  FUNCTIONAL TESTS:  5 times sit to stand: 26 sec Timed up and go (TUG): 13.66 sec 2 minute walk test: 230 ft , rates 4/10 rate of perceived exertion Walk speed: 1.9 ft/sec    TODAY'S TREATMENT:                                                                                                                              DATE:  06/25/22    PATIENT EDUCATION: Education details: rationale of PT intervention, HEP initiated. Encouraged to borrow a 4WW to initiate progressive walking routine Person  educated: Patient Education method: Explanation and Handouts Education comprehension: verbalized understanding and needs further education  HOME EXERCISE PROGRAM: Access Code: [email or web address] URL: [email or web address] Date: 06/25/2022 Prepared by: Sherlyn Lees  Exercises - Supine Quad Set  - 1 x daily - 7 x weekly - 3 sets - 10 reps - 2 sec hold - Supine Lower Trunk Rotation  - 1 x daily - 7 x weekly - 3 sets - 10 reps - Prone Knee Flexion AAROM with Overpressure  - 1 x daily - 7 x weekly - 3 sets - 10 reps  GOALS: Goals reviewed with patient? Yes  SHORT TERM GOALS: Target date: 07/23/2022    Patient will be independent in HEP to improve functional outcomes Baseline: Goal status: INITIAL  2.  Demo improved BLE strength per time of 15 sec 5xSTS Baseline: 26 sec Goal status: INITIAL  3.  Pt to teach-back pain management techniques for BLE discomfort Baseline: uses heating pad and massager on back of legs Goal status: INITIAL    LONG TERM GOALS: Target date: 09/17/2022    Demo improved gait speed/tolerance per distance of 300 ft during 2MWT w/ RPE not exceeding 3/10 Baseline: 230 ft RPE 4/10 Goal status: INITIAL  2.  Independent with advanced HEP that may include land and aquatic-based interventions Baseline:  Goal status: INITIAL    ASSESSMENT:  CLINICAL IMPRESSION: Patient is a 47 y.o. lady who was seen today for physical therapy evaluation and treatment for neuralgia/neuritis, unspecified affecting her BLE.  Patient reports and demonstrates pain-limited activity tolerance impacting her transfers and gait with limited gait speed and decreased standing tolerance.  Patient would benefit from PT services to train and instruct in relevant interventions and when necessary relevant  compensations/adaptations   OBJECTIVE IMPAIRMENTS: Abnormal gait, decreased activity tolerance, decreased endurance, decreased mobility, difficulty walking, decreased strength, impaired perceived functional ability, impaired sensation, and pain.   ACTIVITY LIMITATIONS: carrying, lifting, standing, squatting, stairs, transfers, and locomotion level  PARTICIPATION LIMITATIONS: meal prep, cleaning, laundry, shopping, and community activity  PERSONAL FACTORS: Age, Time since onset of injury/illness/exacerbation, and 1-2 comorbidities: see PMH  are also affecting patient's functional outcome.   REHAB POTENTIAL: Fair based on interaction of conditions, time since onset  CLINICAL DECISION MAKING: Evolving/moderate complexity  EVALUATION COMPLEXITY: Moderate  PLAN:  PT FREQUENCY:  sessions based due to limited availability of aquatic sessions which was patient and referring physician's prerogative   PT DURATION:  8 sessions  PLANNED INTERVENTIONS: Therapeutic exercises, Therapeutic activity, Neuromuscular re-education, Balance training, Gait training, Patient/Family education, Self Care, Joint mobilization, Stair training, Vestibular training, Canalith repositioning, Orthotic/Fit training, DME instructions, Aquatic Therapy, Dry Needling, Electrical stimulation, Spinal mobilization, Cryotherapy, Moist heat, Taping, Ionotophoresis 4mg /ml Dexamethasone, and Manual therapy  PLAN FOR NEXT SESSION: initiate aquatic sessions, trial TENS in clinic for pain control, HEP review   5:38 PM, 06/25/22 M. Sherlyn Lees, PT, DPT Physical Therapist- [facility] Office Number: [phone]

## 2022-06-27 LAB — RPR TITER: RPR Titer: 1:2 {titer} — ABNORMAL HIGH

## 2022-06-27 LAB — HIV-1 RNA QUANT-NO REFLEX-BLD
HIV 1 RNA Quant: NOT DETECTED Copies/mL
HIV-1 RNA Quant, Log: NOT DETECTED Log cps/mL

## 2022-06-27 LAB — T PALLIDUM AB: T Pallidum Abs: POSITIVE — AB

## 2022-06-27 LAB — RPR: RPR Ser Ql: REACTIVE — AB

## 2022-06-27 LAB — HEPATITIS A ANTIBODY, TOTAL: Hepatitis A AB,Total: NONREACTIVE

## 2022-07-15 ENCOUNTER — Encounter: Payer: Self-pay | Admitting: Internal Medicine

## 2022-07-15 ENCOUNTER — Ambulatory Visit: Payer: Medicaid Other | Attending: Internal Medicine | Admitting: Internal Medicine

## 2022-07-15 VITALS — BP 144/78 | HR 72 | Resp 12 | Ht 60.0 in | Wt 122.0 lb

## 2022-07-15 DIAGNOSIS — Z79899 Other long term (current) drug therapy: Secondary | ICD-10-CM | POA: Insufficient documentation

## 2022-07-15 DIAGNOSIS — M0600 Rheumatoid arthritis without rheumatoid factor, unspecified site: Secondary | ICD-10-CM | POA: Insufficient documentation

## 2022-07-15 NOTE — Progress Notes (Unsigned)
Office Visit Note  Patient: Dominique [name]             Date of Birth: 03/21/1976           MRN: [id number]             PCP: Dot Been, FNP Referring: Dot Been, FNP Visit Date: 07/15/2022   Subjective:  Follow-up   History of Present Illness: Dominique [name] is a 47 y.o. 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  Since her last visit she saw physical therapy completed 1 session with them so far is scheduled to follow-up planning to do some pool or other aquatic therapy.  She switched to Lyrica with pain management so far finding this more effective than the gabapentin for ongoing symptoms.  She had mildly low potassium checked in previous clinic visit was concerned if this contributed to muscle cramping issues but was improved back to normal range upon recheck.  Still getting some stiffness and soreness most noticeably at the feet and ankles and some lasting through the day but no major exacerbation.  Not seeing much visible swelling or rashes.  Previous HPI 04/15/22 Dominique [name] is a 47 y.o. female here for follow up for seronegative RA (ANA+) on hydroxychloroquine 200 mg daily and sulfasalazine 1000 mg twice.  No major flareups since her last visit.  Hyperpigmented skin changes mostly around the face may be the same or slightly worse.  Still with persistent foot and ankle pain.   Previous HPI 12/10/21 Dominique [name] is a 47 y.o. 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  Most symptoms are stable main complaint today is bilateral foot and ankle pain and swelling. No obvious difference off of HCQ. Pain is throughout the day describes as stabbing with pins type of sensation. She is seeing swelling accumulate usually by the evening and worse when sitting or standing in fixed position for a long time.    Previous HPI 09/12/2021 Dominique [name] is a 47  y.o. 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 She feels symptoms partially improved with less knee pain but continues to have every day pain in bilateral feet and ankles. Pain is first thing in the morning as well as worsening if she is on her feet all day. Feet swelling is noticeable with prolonged standing worse later in the day. She had coil embolization procedure uneventfully. She has noticed some progression of facial rash.   Previous HPI 07/21/21 Dominique [name] is a 47 y.o. 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 So far she still has about the same amount of pain worst affected areas in both heels and in mid feet. Plantar fasciitis exercises mildly helpful for the bottom of the feet but not much. 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    Previous HPI 05/20/21 Dominique [name] is a 47 y.o. 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 So far her symptoms are about the same. She continues having pain worst in her feet and less extent along medial side of knees. She  does not see much swelling or discoloration just pain. Has not noticed any side effects of the methotrexate.   Previous HPI 01/23/21 Dominique [name] is a 47 y.o. female here for evaluation of positive ANA and elevated sedimentation rate with multiple systemic symptoms including weight loss, lymphadenopathy, paresthesias, joint pain, and headaches.  She started noticing hyperpigmented skin rashes developing on her face and extremities since several months ago does not recall specific onset or provoking episode.  Then developed worsening joint pain in multiple sites but started around July of this year.  This involved pain affecting both hands also the hips knees and feet  bilaterally.  She described morning stiffness lasting 2 or 3 hours in duration also especially pain in the bottoms of the feet first and getting out of bed.  After a month with these ongoing and somewhat worsening symptoms she was seen in primary care clinic. Workup at that time positive for trichomoniasis and syphilis and starting antibiotics also found to have high sedimentation rate with positive ANA and CCP Abs. She followed up for continued treatment last month and more recently in ID clinic with completion of antibiotic treatment for syphilis with Bicillin.  Neurology evaluation with lumbar puncture that was not suggestive for active neurosyphilis. She saw ophthalmology for visual change symptoms reports findings were not concerning for infectious or inflammatory type changes.   Review of Systems  Constitutional:  Positive for fatigue.  HENT:  Positive for mouth dryness. Negative for mouth sores.   Eyes:  Positive for dryness.  Respiratory:  Positive for shortness of breath.   Cardiovascular:  Positive for chest pain and palpitations.  Gastrointestinal:  Negative for blood in stool, constipation and diarrhea.  Endocrine: Negative for increased urination.  Genitourinary:  Negative for involuntary urination.  Musculoskeletal:  Positive for joint pain, joint pain, joint swelling, myalgias, muscle weakness, morning stiffness, muscle tenderness and myalgias. Negative for gait problem.  Skin:  Positive for color change, rash, hair loss and sensitivity to sunlight.  Allergic/Immunologic: Positive for susceptible to infections.  Neurological:  Positive for dizziness and headaches.  Hematological:  Negative for swollen glands.  Psychiatric/Behavioral:  Positive for depressed mood. Negative for sleep disturbance. The patient is nervous/anxious.     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   Right hip pain 05/16/2020   BRBPR (bright red blood per rectum) 10/18/2019   Tobacco use disorder 07/31/2019   Dyspnea, paroxysmal nocturnal 07/31/2019   Schizophrenia 07/31/2019   Encounter for screening examination for sexually transmitted disease 07/31/2019   Abnormal uterine bleeding (AUB) 05/09/2013   BV (bacterial vaginosis) 05/09/2013   Dysmenorrhea 05/09/2013    Past Medical History:  Diagnosis Date   Aneurysm    Arthritis    RA   Kidney stones    Lupus 01/17/2021   Lupus    Schizophrenia    Systemic lupus erythematosus     Family History  Problem Relation Age of Onset   Stroke Mother    Multiple sclerosis Father    Hypertension Father    Diabetes Father    Asthma  Brother    Dementia Maternal Grandfather    Past Surgical History:  Procedure Laterality Date   CESAREAN SECTION     HERNIA REPAIR     IR 3D INDEPENDENT WKST  07/28/2021   IR ANGIO INTRA EXTRACRAN SEL INTERNAL CAROTID BILAT MOD SED  07/28/2021   IR ANGIO VERTEBRAL SEL VERTEBRAL UNI L MOD SED  07/28/2021   IR ANGIOGRAM FOLLOW UP STUDY  07/28/2021   IR CT HEAD LTD  07/28/2021   IR NEURO EACH ADD'L AFTER BASIC UNI LEFT (MS)  07/28/2021   IR RADIOLOGIST EVAL & MGMT  07/10/2021   IR RADIOLOGIST EVAL & MGMT  08/15/2021   IR TRANSCATH/EMBOLIZ  07/28/2021   IR US GUIDE VASC ACCESS RIGHT  07/28/2021   RADIOLOGY WITH ANESTHESIA N/A 07/28/2021   Procedure: EMBOLIZATION;  Surgeon: Julieanne Cotton, MD;  Location: [facility] OR;  Service: Radiology;  Laterality: N/A;   Social History   Social History Narrative   Right handed   Immunization History  Administered Date(s) Administered   Hepb-cpg 06/25/2021,  09/02/2021   Influenza,inj,Quad PF,6+ Mos 01/30/2021, 12/31/2021   Tdap 02/25/2022     Objective: Vital Signs: BP (!) 144/78 (BP Location: Left Arm, Patient Position: Sitting, Cuff Size: Normal)   Pulse 72   Resp 12   Ht 5' (1.524 m)   Wt 122 lb (55.3 kg)   BMI 23.83 kg/m    Physical Exam Cardiovascular:     Rate and Rhythm: Normal rate and regular rhythm.  Pulmonary:     Effort: Pulmonary effort is normal.     Breath sounds: Normal breath sounds.  Musculoskeletal:     Right lower leg: No edema.     Left lower leg: No edema.  Skin:    General: Skin is warm and dry.     Findings: Rash present.     Comments: Patchy hyperpigmented skin changes in a broad distribution with no particular wheals or erythema  Neurological:     Mental Status: She is alert.  Psychiatric:        Mood and Affect: Mood normal.      Musculoskeletal Exam:  Shoulders full ROM no tenderness or swelling Elbows full ROM no tenderness or swelling Wrists full ROM no tenderness or swelling Fingers full ROM no tenderness or swelling No paraspinal tenderness to palpation over upper and lower back Hip normal internal and external rotation without pain, no tenderness to lateral hip palpation Knees full ROM no tenderness or swelling Mild tenderness to pressure at the foot and ankle without any palpable swelling    Investigation: No additional findings.  Imaging: No results found.  Recent Labs: Lab Results  Component Value Date   WBC 6.8 07/15/2022   HGB 10.7 (L) 07/15/2022   PLT 212 07/15/2022   NA 137 04/15/2022   K 3.3 (L) 04/15/2022   CL 102 04/15/2022   CO2 26 04/15/2022   GLUCOSE 83 04/15/2022   BUN 6 (L) 04/15/2022   CREATININE 0.70 04/15/2022   BILITOT 0.4 04/15/2022   ALKPHOS 66 10/12/2021   AST 16 04/15/2022   ALT 14 04/15/2022   PROT 7.8 04/15/2022   ALBUMIN 4.1 10/12/2021   CALCIUM 9.7 04/15/2022   GFRAA 117 05/20/2020    Speciality Comments: LMOM for Dr. Laruth Bouchard office to  fax PLQ eye exam 06/18/2021 [facility]  Procedures:  No procedures performed Allergies: Patient has no known allergies.   Assessment / Plan:     Visit Diagnoses: Seronegative rheumatoid arthritis - Plan: Sedimentation rate  Joint inflammation appears pretty well-controlled worst pain areas in the lower extremities also without objective synovitis on exam today.  Will check sedimentation rate for inflammatory activity monitoring.  Plan to continue the hydroxychloroquine 200 mg daily and sulfasalazine 1000 mg twice daily.  High risk medication use - Plan: CBC with Differential/Platelet, COMPLETE METABOLIC PANEL WITH GFR  Checking CBC and CMP for medication monitoring.  Previous ophthalmology exam for long-term use of hydroxychloroquine from March 2023 need to get this repeated.  Orders: Orders Placed This Encounter  Procedures   Sedimentation rate   CBC with Differential/Platelet   COMPLETE METABOLIC PANEL WITH GFR   No orders of the defined types were placed in this encounter.    Follow-Up Instructions: Return in about 3 months (around 10/14/2022) for RA/SLE HCQ/SSZ f/u 55mos.   Fuller Plan, MD  Note - This record has been created using [employer].  Chart creation errors have been sought, but may not always  have been located. Such creation errors do not reflect on  the standard of medical care.

## 2022-07-16 LAB — CBC WITH DIFFERENTIAL/PLATELET
Absolute Monocytes: 442 cells/uL (ref 200–950)
Basophils Absolute: 20 cells/uL (ref 0–200)
Basophils Relative: 0.3 %
Eosinophils Absolute: 27 cells/uL (ref 15–500)
Eosinophils Relative: 0.4 %
HCT: 31.9 % — ABNORMAL LOW (ref 35.0–45.0)
Hemoglobin: 10.7 g/dL — ABNORMAL LOW (ref 11.7–15.5)
Lymphs Abs: 2496 cells/uL (ref 850–3900)
MCH: 31.6 pg (ref 27.0–33.0)
MCHC: 33.5 g/dL (ref 32.0–36.0)
MCV: 94.1 fL (ref 80.0–100.0)
MPV: 11.2 fL (ref 7.5–12.5)
Monocytes Relative: 6.5 %
Neutro Abs: 3815 cells/uL (ref 1500–7800)
Neutrophils Relative %: 56.1 %
Platelets: 212 10*3/uL (ref 140–400)
RBC: 3.39 10*6/uL — ABNORMAL LOW (ref 3.80–5.10)
RDW: 13.9 % (ref 11.0–15.0)
Total Lymphocyte: 36.7 %
WBC: 6.8 10*3/uL (ref 3.8–10.8)

## 2022-07-16 LAB — COMPLETE METABOLIC PANEL WITH GFR
AG Ratio: 1.5 (calc) (ref 1.0–2.5)
ALT: 18 U/L (ref 6–29)
AST: 20 U/L (ref 10–35)
Albumin: 4.2 g/dL (ref 3.6–5.1)
Alkaline phosphatase (APISO): 76 U/L (ref 31–125)
BUN: 8 mg/dL (ref 7–25)
CO2: 25 mmol/L (ref 20–32)
Calcium: 9.1 mg/dL (ref 8.6–10.2)
Chloride: 107 mmol/L (ref 98–110)
Creat: 0.69 mg/dL (ref 0.50–0.99)
Globulin: 2.8 g/dL (calc) (ref 1.9–3.7)
Glucose, Bld: 87 mg/dL (ref 65–99)
Potassium: 4 mmol/L (ref 3.5–5.3)
Sodium: 139 mmol/L (ref 135–146)
Total Bilirubin: 0.4 mg/dL (ref 0.2–1.2)
Total Protein: 7 g/dL (ref 6.1–8.1)
eGFR: 109 mL/min/{1.73_m2} (ref >=60)

## 2022-07-16 LAB — SEDIMENTATION RATE: Sed Rate: 17 mm/h (ref 0–20)

## 2022-07-16 NOTE — Progress Notes (Signed)
Sedimentation rate is normal. Hemoglobin remains very mildly low but about her baseline. Kidney and liver function are normal and no problems for current medication.

## 2022-07-23 ENCOUNTER — Ambulatory Visit (HOSPITAL_BASED_OUTPATIENT_CLINIC_OR_DEPARTMENT_OTHER): Payer: Medicaid Other | Admitting: Physical Therapy

## 2022-07-31 ENCOUNTER — Ambulatory Visit: Payer: Medicaid Other | Admitting: Pharmacist

## 2022-08-05 ENCOUNTER — Ambulatory Visit: Payer: Medicaid Other | Attending: Pain Medicine

## 2022-08-05 DIAGNOSIS — R2689 Other abnormalities of gait and mobility: Secondary | ICD-10-CM | POA: Insufficient documentation

## 2022-08-05 DIAGNOSIS — R262 Difficulty in walking, not elsewhere classified: Secondary | ICD-10-CM | POA: Diagnosis present

## 2022-08-05 DIAGNOSIS — M6281 Muscle weakness (generalized): Secondary | ICD-10-CM | POA: Insufficient documentation

## 2022-08-05 NOTE — Therapy (Signed)
OUTPATIENT PHYSICAL THERAPY NEURO TREATMENT   Patient Name: Dominique [name] MRN: [id number] DOB:06-Dec-1975, 47 y.o., female Today's Date: 08/05/2022   PCP: Dot Been, FNP REFERRING PROVIDER: Maximiano Coss, MD  END OF SESSION:  PT End of Session - 08/05/22 1056     Visit Number 2    Number of Visits 8    Date for PT Re-Evaluation 09/17/22    Authorization Type Medicaid    Authorization Time Period auth required, submitted via Medicaid website    Authorization - Number of Visits 3    Progress Note Due on Visit 3    PT Start Time 1100    PT Stop Time 1145    PT Time Calculation (min) 45 min             Past Medical History:  Diagnosis Date   Aneurysm (HCC)    Arthritis    RA   Kidney stones    Lupus (HCC) 01/17/2021   Lupus (HCC)    Schizophrenia (HCC)    Systemic lupus erythematosus (HCC)    Past Surgical History:  Procedure Laterality Date   CESAREAN SECTION     HERNIA REPAIR     IR 3D INDEPENDENT WKST  07/28/2021   IR ANGIO INTRA EXTRACRAN SEL INTERNAL CAROTID BILAT MOD SED  07/28/2021   IR ANGIO VERTEBRAL SEL VERTEBRAL UNI L MOD SED  07/28/2021   IR ANGIOGRAM FOLLOW UP STUDY  07/28/2021   IR CT HEAD LTD  07/28/2021   IR NEURO EACH ADD'L AFTER BASIC UNI LEFT (MS)  07/28/2021   IR RADIOLOGIST EVAL & MGMT  07/10/2021   IR RADIOLOGIST EVAL & MGMT  08/15/2021   IR TRANSCATH/EMBOLIZ  07/28/2021   IR US GUIDE VASC ACCESS RIGHT  07/28/2021   RADIOLOGY WITH ANESTHESIA N/A 07/28/2021   Procedure: EMBOLIZATION;  Surgeon: Julieanne Cotton, MD;  Location: [facility] OR;  Service: Radiology;  Laterality: N/A;   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   Brain aneurysm 07/28/2021   Bilateral ankle pain 05/20/2021   High risk medication use 04/21/2021   Herpes 01/23/2021   Polyarthralgia 01/23/2021   Rash and other nonspecific skin eruption 01/23/2021   Lupus (HCC) 01/17/2021   Neurosyphilis in adult 12/18/2020    Blurry vision, bilateral 12/18/2020   Smoking 12/18/2020   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   Right hip pain 05/16/2020   BRBPR (bright red blood per rectum) 10/18/2019   Tobacco use disorder 07/31/2019   Dyspnea, paroxysmal nocturnal 07/31/2019   Schizophrenia (HCC) 07/31/2019   Encounter for screening examination for sexually transmitted disease 07/31/2019   Abnormal uterine bleeding (AUB) 05/09/2013   BV (bacterial vaginosis) 05/09/2013   Dysmenorrhea 05/09/2013    ONSET DATE: 2 years ago for lupus dx, neuropathy around 1.5 years ago  REFERRING DIAG: M79.2 (ICD-10-CM) - Neuralgia and neuritis, unspecified  THERAPY DIAG:  Difficulty in walking, not elsewhere classified  Muscle weakness (generalized)  Other abnormalities of gait and mobility  Rationale for Evaluation and Treatment: Rehabilitation  SUBJECTIVE:  SUBJECTIVE STATEMENT: Legs are feeling better from the recent medication change. Couldn't make pool therapy due to illness.  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 Yes: NPRS scale: 10/10 Pain location: right and left anterior leg/thigh Pain description: pins, needles, charlie horse/cramping Aggravating factors: walking, standing Relieving factors: voltaren gel, heating pad at night  PRECAUTIONS: None  WEIGHT BEARING RESTRICTIONS: No  FALLS: Has patient fallen in last 6 months? No  LIVING ENVIRONMENT: Lives with: lives with their family Lives in: House/apartment Stairs: Yes: External: 4 steps; yes Has following equipment at home: None  PLOF:  pt reports she has a nurse that will be coming to assist with  ADL/housekeeping  PATIENT GOALS: reduce pain  OBJECTIVE:   TODAY'S TREATMENT: 08/05/22 Activity Comments  HEP review Independence with initial  5xSTS test  22 sec  w/ rollator 200 ft  NU-step level 3 x 8 min SPM 20-30  Gastroc stretch 2x60 sec   Gait training W/ rollator for activity pacing     DIAGNOSTIC FINDINGS:   COGNITION: Overall cognitive status: Within functional limits for tasks assessed   SENSATION: Notes general sensory disturbance   COORDINATION: WFL  EDEMA:  None present  MUSCLE TONE: WNL  MUSCLE LENGTH: WNL     POSTURE: No Significant postural limitations  LOWER EXTREMITY ROM:     Active  Right Eval Left Eval  Hip flexion    Hip extension    Hip abduction    Hip adduction    Hip internal rotation    Hip external rotation    Knee flexion 100 100  Knee extension 0 0  Ankle dorsiflexion    Ankle plantarflexion    Ankle inversion    Ankle eversion     (Blank rows = not tested)  Knee flexion limited by pain (stretch feeling along anterior thigh)  LOWER EXTREMITY MMT:    Grossly 5/5 to seated resisted tests  BED MOBILITY:  independent  TRANSFERS: Assistive device utilized: None  Sit to stand: Complete Independence Stand to sit: Complete Independence Chair to chair: Complete Independence Floor:  NT    STAIRS: Level of Assistance: Modified independence Stair Negotiation Technique: Alternating Pattern  with Single Rail on Right Number of Stairs: 12  Height of Stairs: 4-6 inches  Comments:   GAIT: Gait pattern: antalgic Distance walked:  Assistive device utilized: None Level of assistance: Complete Independence Comments: decreased speed  FUNCTIONAL TESTS:  5 times sit to stand: 26 sec Timed up and go (TUG): 13.66 sec 2 minute walk test: 230 ft , rates 4/10 rate of perceived exertion Walk speed: 1.9 ft/sec    TODAY'S TREATMENT:  DATE: 06/25/22    PATIENT EDUCATION: Education details: rationale of PT intervention, HEP initiated. Encouraged to borrow a 4WW to initiate progressive walking routine Person educated: Patient Education method: Explanation and Handouts Education comprehension: verbalized understanding and needs further education  HOME EXERCISE PROGRAM: Access Code: [email or web address] URL: [email or web address] Date: 06/25/2022 Prepared by: Shary Decamp  Exercises - Supine Quad Set  - 1 x daily - 7 x weekly - 3 sets - 10 reps - 2 sec hold - Supine Lower Trunk Rotation  - 1 x daily - 7 x weekly - 3 sets - 10 reps - Prone Knee Flexion AAROM with Overpressure  - 1 x daily - 7 x weekly - 3 sets - 10 reps - Standing Gastroc Stretch at Counter  - 1 x daily - 7 x weekly - 2 sets - 60 sec hold  GOALS: Goals reviewed with patient? Yes  SHORT TERM GOALS: Target date: 07/23/2022    Patient will be independent in HEP to improve functional outcomes Baseline: Goal status: MET  2.  Demo improved BLE strength per time of 15 sec 5xSTS Baseline: 26 sec; (08/05/22) 22 sec Goal status: IN PROGRESS  3.  Pt to teach-back pain management techniques for BLE discomfort Baseline: uses heating pad and massager on back of legs Goal status: IN PROGRESS    LONG TERM GOALS: Target date: 09/17/2022    Demo improved gait speed/tolerance per distance of 300 ft during w/ RPE not exceeding 3/10 Baseline: 230 ft RPE 4/10 Goal status: IN PROGRESS  2.  Independent with advanced HEP that may include land and aquatic-based interventions Baseline:  Goal status: IN PROGRESS    ASSESSMENT:  CLINICAL IMPRESSION: Review of HEP with independence in initial program.  Gait  training w/ rollator to improve walking tolerance and to encourage walking routine and performed with device and travels 200 ft.  Pt educated and demonstration of low impact cardiovascular exercise for its  myriad of benefits.  Overall pt has quite poor activity tolerance due to LE weakness and neuropathic pain requiring frequent therapeutic rest periods and moves in a slow, guarded fashion.  Pt has aquatic session next week which will hopefully be better tolerated. Continued sessions to meet POC details  OBJECTIVE IMPAIRMENTS: Abnormal gait, decreased activity tolerance, decreased endurance, decreased mobility, difficulty walking, decreased strength, impaired perceived functional ability, impaired sensation, and pain.   ACTIVITY LIMITATIONS: carrying, lifting, standing, squatting, stairs, transfers, and locomotion level  PARTICIPATION LIMITATIONS: meal prep, cleaning, laundry, shopping, and community activity  PERSONAL FACTORS: Age, Time since onset of injury/illness/exacerbation, and 1-2 comorbidities: see PMH  are also affecting patient's functional outcome.   REHAB POTENTIAL: Fair based on interaction of conditions, time since onset  CLINICAL DECISION MAKING: Evolving/moderate complexity  EVALUATION COMPLEXITY: Moderate  PLAN:  PT FREQUENCY:  sessions based due to limited availability of aquatic sessions which was patient and referring physician's prerogative   PT DURATION:  8 sessions  PLANNED INTERVENTIONS: Therapeutic exercises, Therapeutic activity, Neuromuscular re-education, Balance training, Gait training, Patient/Family education, Self Care, Joint mobilization, Stair training, Vestibular training, Canalith repositioning, Orthotic/Fit training, DME instructions, Aquatic Therapy, Dry Needling, Electrical stimulation, Spinal mobilization, Cryotherapy, Moist heat, Taping, Ionotophoresis 4mg /ml Dexamethasone, and Manual therapy  PLAN FOR NEXT SESSION: initiate aquatic sessions, trial TENS in clinic for pain control, HEP review   10:57 AM, 08/05/22 M. Shary Decamp, PT, DPT Physical Therapist- [place] Office Number: [phone]

## 2022-08-12 ENCOUNTER — Encounter (HOSPITAL_BASED_OUTPATIENT_CLINIC_OR_DEPARTMENT_OTHER): Payer: Self-pay | Admitting: Physical Therapy

## 2022-08-12 ENCOUNTER — Ambulatory Visit (HOSPITAL_BASED_OUTPATIENT_CLINIC_OR_DEPARTMENT_OTHER): Payer: Medicaid Other | Attending: Pain Medicine | Admitting: Physical Therapy

## 2022-08-12 DIAGNOSIS — R2689 Other abnormalities of gait and mobility: Secondary | ICD-10-CM | POA: Diagnosis present

## 2022-08-12 DIAGNOSIS — M6281 Muscle weakness (generalized): Secondary | ICD-10-CM | POA: Diagnosis present

## 2022-08-12 DIAGNOSIS — R262 Difficulty in walking, not elsewhere classified: Secondary | ICD-10-CM | POA: Diagnosis present

## 2022-08-12 NOTE — Therapy (Signed)
OUTPATIENT PHYSICAL THERAPY NEURO TREATMENT   Patient Name: Dominique [name] MRN: [id number] DOB:17-Dec-1975, 47 y.o., female Today's Date: 08/12/2022   PCP: Dot Been, FNP REFERRING PROVIDER: Maximiano Coss, MD  END OF SESSION:  PT End of Session - 08/12/22 1123     Visit Number 3    Number of Visits 8    Date for PT Re-Evaluation 09/17/22    Authorization Type Medicaid    Authorization Time Period auth required, submitted via Medicaid website    Authorization - Number of Visits 3    Progress Note Due on Visit 3    PT Start Time 1032    PT Stop Time 1115    PT Time Calculation (min) 43 min    Activity Tolerance Patient tolerated treatment well;Patient limited by pain    Behavior During Therapy WFL for tasks assessed/performed              Past Medical History:  Diagnosis Date   Aneurysm (HCC)    Arthritis    RA   Kidney stones    Lupus (HCC) 01/17/2021   Lupus (HCC)    Schizophrenia (HCC)    Systemic lupus erythematosus (HCC)    Past Surgical History:  Procedure Laterality Date   CESAREAN SECTION     HERNIA REPAIR     IR 3D INDEPENDENT WKST  07/28/2021   IR ANGIO INTRA EXTRACRAN SEL INTERNAL CAROTID BILAT MOD SED  07/28/2021   IR ANGIO VERTEBRAL SEL VERTEBRAL UNI L MOD SED  07/28/2021   IR ANGIOGRAM FOLLOW UP STUDY  07/28/2021   IR CT HEAD LTD  07/28/2021   IR NEURO EACH ADD'L AFTER BASIC UNI LEFT (MS)  07/28/2021   IR RADIOLOGIST EVAL & MGMT  07/10/2021   IR RADIOLOGIST EVAL & MGMT  08/15/2021   IR TRANSCATH/EMBOLIZ  07/28/2021   IR US GUIDE VASC ACCESS RIGHT  07/28/2021   RADIOLOGY WITH ANESTHESIA N/A 07/28/2021   Procedure: EMBOLIZATION;  Surgeon: Julieanne Cotton, MD;  Location: [facility] OR;  Service: Radiology;  Laterality: N/A;   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   Brain aneurysm 07/28/2021   Bilateral ankle pain 05/20/2021   High risk medication use 04/21/2021   Herpes 01/23/2021    Polyarthralgia 01/23/2021   Rash and other nonspecific skin eruption 01/23/2021   Lupus (HCC) 01/17/2021   Neurosyphilis in adult 12/18/2020   Blurry vision, bilateral 12/18/2020   Smoking 12/18/2020   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   Right hip pain 05/16/2020   BRBPR (bright red blood per rectum) 10/18/2019   Tobacco use disorder 07/31/2019   Dyspnea, paroxysmal nocturnal 07/31/2019   Schizophrenia (HCC) 07/31/2019   Encounter for screening examination for sexually transmitted disease 07/31/2019   Abnormal uterine bleeding (AUB) 05/09/2013   BV (bacterial vaginosis) 05/09/2013   Dysmenorrhea 05/09/2013    ONSET DATE: 2 years ago for lupus dx, neuropathy around 1.5 years ago  REFERRING DIAG: M79.2 (ICD-10-CM) - Neuralgia and neuritis, unspecified  THERAPY DIAG:  Difficulty in walking, not elsewhere classified  Muscle weakness (generalized)  Other abnormalities of gait and mobility  Rationale for Evaluation and Treatment: Rehabilitation  SUBJECTIVE:  SUBJECTIVE STATEMENT: No falls since last session.  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 Yes: NPRS scale: 10/10 Pain location: right and left anterior leg/thigh Pain description: pins, needles, charlie horse/cramping Aggravating factors: walking, standing Relieving factors: voltaren gel, heating pad at night  PRECAUTIONS: None  WEIGHT BEARING RESTRICTIONS: No  FALLS: Has patient fallen in last 6 months? No  LIVING ENVIRONMENT: Lives with: lives with their family Lives in: House/apartment Stairs: Yes: External: 4 steps; yes Has following equipment at home: None  PLOF:  pt  reports she has a nurse that will be coming to assist with ADL/housekeeping  PATIENT GOALS: reduce pain  OBJECTIVE:   08/12/22 Pt seen for aquatic therapy today.  Treatment took place in water 3.5-4.75 ft in depth at the [facility] pool. Temp of water was 91.  Pt entered/exited the pool via stairs using step to pattern with hand rail.   *Intro to setting *walking 3 ft ue support barbell forward and back x 4 widths; side stepping x 2. Cues for heel strike and toe off *UE support on wall: df;pf x 10 *seated on lift: LAQ; hip add/abd; cycling; SLR/flutter kicking x 2 minutes ea. Pt at slow pace due to pain *return to walking with cuing for erect posture *standing ue support on wall: hip extension; relaxed squats; hip abd; hip circles   Pt requires the buoyancy and hydrostatic pressure of water for support, and to offload joints by unweighting joint load by at least 50 % in navel deep water and by at least 75-80% in chest to neck deep water.  Viscosity of the water is needed for resistance of strengthening. Water current perturbations provides challenge to standing balance requiring increased core activation.    TODAY'S TREATMENT: 08/05/22 Activity Comments  HEP review Independence with initial  5xSTS test  22 sec  w/ rollator 200 ft  NU-step level 3 x 8 min SPM 20-30  Gastroc stretch 2x60 sec   Gait training W/ rollator for activity pacing     DIAGNOSTIC FINDINGS:   COGNITION: Overall cognitive status: Within functional limits for tasks assessed   SENSATION: Notes general sensory disturbance   COORDINATION: WFL  EDEMA:  None present  MUSCLE TONE: WNL  MUSCLE LENGTH: WNL     POSTURE: No Significant postural limitations  LOWER EXTREMITY ROM:     Active  Right Eval Left Eval  Hip flexion    Hip extension    Hip abduction    Hip adduction    Hip internal rotation    Hip external rotation    Knee flexion 100 100  Knee extension 0 0  Ankle  dorsiflexion    Ankle plantarflexion    Ankle inversion    Ankle eversion     (Blank rows = not tested)  Knee flexion limited by pain (stretch feeling along anterior thigh)  LOWER EXTREMITY MMT:    Grossly 5/5 to seated resisted tests  BED MOBILITY:  independent  TRANSFERS: Assistive device utilized: None  Sit to stand: Complete Independence Stand to sit: Complete Independence Chair to chair: Complete Independence Floor:  NT    STAIRS: Level of Assistance: Modified independence Stair Negotiation Technique: Alternating Pattern  with Single Rail on Right Number of Stairs: 12  Height of Stairs: 4-6 inches  Comments:   GAIT: Gait pattern: antalgic Distance walked:  Assistive device utilized: None Level of assistance: Complete Independence Comments: decreased speed  FUNCTIONAL TESTS:  5 times sit to stand: 26 sec Timed up and go (TUG): 13.66 sec 2 minute walk test: 230 ft , rates 4/10 rate of perceived exertion Walk speed: 1.9 ft/sec    TODAY'S TREATMENT:                                                                                                                              DATE: 06/25/22    PATIENT EDUCATION: Education details: rationale of PT intervention, HEP initiated. Encouraged to borrow a 4WW to initiate progressive walking routine Person educated: Patient Education method: Explanation and Handouts Education comprehension: verbalized understanding and needs further education  HOME EXERCISE PROGRAM: Access Code: [email or web address] URL: [email or web address] Date: 06/25/2022 Prepared by: Shary Decamp  Exercises - Supine Quad Set  - 1 x daily - 7 x weekly - 3 sets - 10 reps - 2 sec hold - Supine Lower Trunk Rotation  - 1 x daily - 7 x weekly - 3 sets - 10 reps - Prone Knee Flexion AAROM with Overpressure  - 1 x daily - 7 x weekly - 3 sets - 10 reps - Standing Gastroc Stretch at Counter  - 1 x daily - 7 x weekly - 2 sets - 60 sec  hold  GOALS: Goals reviewed with patient? Yes  SHORT TERM GOALS: Target date: 07/23/2022    Patient will be independent in HEP to improve functional outcomes Baseline: Goal status: MET  2.  Demo improved BLE strength per time of 15 sec 5xSTS Baseline: 26 sec; (08/05/22) 22 sec Goal status: IN PROGRESS  3.  Pt to teach-back pain management techniques for BLE discomfort Baseline: uses heating pad and massager on back of legs Goal status: IN PROGRESS    LONG TERM GOALS: Target date: 09/17/2022    Demo improved gait speed/tolerance per distance of 300 ft during w/ RPE not exceeding 3/10 Baseline: 230 ft RPE 4/10 Goal status: IN PROGRESS  2.  Independent with advanced HEP that may include land and aquatic-based interventions Baseline:  Goal status: IN PROGRESS    ASSESSMENT:  CLINICAL IMPRESSION: Pt demonstrates safety and indep in setting with therapist instructing from deck.  She maintains guarded positioning throughout session with complaints of rle pain.  Requires frequent rest periods. She completes all tasks requested working through discomfort well. Needs cues for working through entire range with exercise and erect posture with amb.  She does report decreased pain in LLE with movement as compared to land based.  She finishes in hot water jacuzzi (unbilled) which also decreases her pain sensitivity bilaterally.  She is a good candidate for aquatic therapy intervention and will benefit from the properties of water to progress towards meeting land based goals.    OBJECTIVE IMPAIRMENTS: Abnormal gait, decreased activity tolerance, decreased endurance, decreased mobility, difficulty walking, decreased strength, impaired perceived functional ability, impaired sensation, and pain.  ACTIVITY LIMITATIONS: carrying, lifting, standing, squatting, stairs, transfers, and locomotion level  PARTICIPATION LIMITATIONS: meal prep, cleaning, laundry, shopping, and community  activity  PERSONAL FACTORS: Age, Time since onset of injury/illness/exacerbation, and 1-2 comorbidities: see PMH  are also affecting patient's functional outcome.   REHAB POTENTIAL: Fair based on interaction of conditions, time since onset  CLINICAL DECISION MAKING: Evolving/moderate complexity  EVALUATION COMPLEXITY: Moderate  PLAN:  PT FREQUENCY:  sessions based due to limited availability of aquatic sessions which was patient and referring physician's prerogative   PT DURATION:  8 sessions  PLANNED INTERVENTIONS: Therapeutic exercises, Therapeutic activity, Neuromuscular re-education, Balance training, Gait training, Patient/Family education, Self Care, Joint mobilization, Stair training, Vestibular training, Canalith repositioning, Orthotic/Fit training, DME instructions, Aquatic Therapy, Dry Needling, Electrical stimulation, Spinal mobilization, Cryotherapy, Moist heat, Taping, Ionotophoresis 4mg /ml Dexamethasone, and Manual therapy  PLAN FOR NEXT SESSION: initiate aquatic sessions, trial TENS in clinic for pain control, HEP review   11:24 AM, 08/12/22 Rushie Chestnut) [place] MPT

## 2022-08-14 ENCOUNTER — Other Ambulatory Visit: Payer: Self-pay

## 2022-08-14 ENCOUNTER — Other Ambulatory Visit (HOSPITAL_COMMUNITY): Payer: Self-pay

## 2022-08-14 ENCOUNTER — Other Ambulatory Visit: Payer: Self-pay | Admitting: Pharmacist

## 2022-08-14 DIAGNOSIS — Z79899 Other long term (current) drug therapy: Secondary | ICD-10-CM

## 2022-08-14 MED ORDER — APRETUDE 600 MG/3ML IM SUER
600.0000 mg | INTRAMUSCULAR | 5 refills | Status: DC
Start: 2022-08-14 — End: 2023-08-10
  Filled 2022-08-14 – 2022-08-17 (×2): qty 3, 60d supply, fill #0
  Filled 2022-10-15: qty 3, 60d supply, fill #1
  Filled 2022-12-17: qty 3, 60d supply, fill #2
  Filled 2023-02-08: qty 3, 60d supply, fill #3
  Filled 2023-04-13: qty 3, 60d supply, fill #4
  Filled 2023-06-17: qty 3, 60d supply, fill #5

## 2022-08-15 ENCOUNTER — Other Ambulatory Visit: Payer: Self-pay | Admitting: Internal Medicine

## 2022-08-15 DIAGNOSIS — M2550 Pain in unspecified joint: Secondary | ICD-10-CM

## 2022-08-15 DIAGNOSIS — R21 Rash and other nonspecific skin eruption: Secondary | ICD-10-CM

## 2022-08-15 DIAGNOSIS — R768 Other specified abnormal immunological findings in serum: Secondary | ICD-10-CM

## 2022-08-17 ENCOUNTER — Other Ambulatory Visit: Payer: Self-pay | Admitting: Internal Medicine

## 2022-08-17 ENCOUNTER — Other Ambulatory Visit (HOSPITAL_COMMUNITY): Payer: Self-pay

## 2022-08-17 ENCOUNTER — Other Ambulatory Visit: Payer: Self-pay

## 2022-08-17 DIAGNOSIS — M2550 Pain in unspecified joint: Secondary | ICD-10-CM

## 2022-08-17 DIAGNOSIS — R21 Rash and other nonspecific skin eruption: Secondary | ICD-10-CM

## 2022-08-17 DIAGNOSIS — R768 Other specified abnormal immunological findings in serum: Secondary | ICD-10-CM

## 2022-08-17 NOTE — Telephone Encounter (Signed)
Last Fill: 05/18/2022  Eye exam: not on file   Labs: 07/15/2022 Sedimentation rate is normal. Hemoglobin remains very mildly low but about her baseline. Kidney and liver function are normal and no problems for current medication.   Next Visit: 10/15/2022  Last Visit: 07/15/2022  ZO:[id number] rheumatoid arthritis   Current Dose per office note 07/15/2022: hydroxychloroquine 200 mg daily  Contacted the patient about PLQ Eye Exam. Patient states she had an eye exam done in the middle of march this year. Patient states she has to go back to re-do another exam that included a dot. Advised patient to have [facility] fax [place] the results.   Okay to refill Plaquenil?

## 2022-08-18 ENCOUNTER — Telehealth: Payer: Self-pay

## 2022-08-18 NOTE — Telephone Encounter (Signed)
[facility] Patient Advocate Encounter  Patient's medication (Apretude) have been couriered to [facility] from [facility] and will be administered on the patient next office visit on 08/26/22.  Jerrica Thorman , CPhT [facility] Phone: [phone] Fax:  [phone]  

## 2022-08-19 ENCOUNTER — Ambulatory Visit: Payer: Medicaid Other

## 2022-08-21 ENCOUNTER — Ambulatory Visit: Payer: Medicaid Other

## 2022-08-21 DIAGNOSIS — M6281 Muscle weakness (generalized): Secondary | ICD-10-CM

## 2022-08-21 DIAGNOSIS — R2689 Other abnormalities of gait and mobility: Secondary | ICD-10-CM

## 2022-08-21 DIAGNOSIS — R262 Difficulty in walking, not elsewhere classified: Secondary | ICD-10-CM | POA: Diagnosis not present

## 2022-08-21 NOTE — Therapy (Signed)
OUTPATIENT PHYSICAL THERAPY NEURO TREATMENT   Patient Name: Dominique [name] MRN: [id number] DOB:1976/01/20, 47 y.o., female Today's Date: 08/21/2022   PCP: Dot Been, FNP REFERRING PROVIDER: Maximiano Coss, MD  END OF SESSION:  PT End of Session - 08/21/22 0934     Visit Number 4    Number of Visits 8    Date for PT Re-Evaluation 09/17/22    Authorization Type Medicaid    Authorization Time Period auth required, submitted via Medicaid website    Authorization - Visit Number 2    Authorization - Number of Visits 6    PT Start Time [id number]    PT Stop Time 1015    PT Time Calculation (min) 44 min    Activity Tolerance Patient tolerated treatment well;Patient limited by pain    Behavior During Therapy [facility] for tasks assessed/performed              Past Medical History:  Diagnosis Date   Aneurysm (HCC)    Arthritis    RA   Kidney stones    Lupus (HCC) 01/17/2021   Lupus (HCC)    Schizophrenia (HCC)    Systemic lupus erythematosus (HCC)    Past Surgical History:  Procedure Laterality Date   CESAREAN SECTION     HERNIA REPAIR     IR 3D INDEPENDENT WKST  07/28/2021   IR ANGIO INTRA EXTRACRAN SEL INTERNAL CAROTID BILAT MOD SED  07/28/2021   IR ANGIO VERTEBRAL SEL VERTEBRAL UNI L MOD SED  07/28/2021   IR ANGIOGRAM FOLLOW UP STUDY  07/28/2021   IR CT HEAD LTD  07/28/2021   IR NEURO EACH ADD'L AFTER BASIC UNI LEFT (MS)  07/28/2021   IR RADIOLOGIST EVAL & MGMT  07/10/2021   IR RADIOLOGIST EVAL & MGMT  08/15/2021   IR TRANSCATH/EMBOLIZ  07/28/2021   IR US GUIDE VASC ACCESS RIGHT  07/28/2021   RADIOLOGY WITH ANESTHESIA N/A 07/28/2021   Procedure: EMBOLIZATION;  Surgeon: Julieanne Cotton, MD;  Location: [facility] OR;  Service: Radiology;  Laterality: N/A;   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   Brain aneurysm 07/28/2021   Bilateral ankle pain 05/20/2021   High risk medication use 04/21/2021   Herpes 01/23/2021    Polyarthralgia 01/23/2021   Rash and other nonspecific skin eruption 01/23/2021   Lupus (HCC) 01/17/2021   Neurosyphilis in adult 12/18/2020   Blurry vision, bilateral 12/18/2020   Smoking 12/18/2020   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   Right hip pain 05/16/2020   BRBPR (bright red blood per rectum) 10/18/2019   Tobacco use disorder 07/31/2019   Dyspnea, paroxysmal nocturnal 07/31/2019   Schizophrenia (HCC) 07/31/2019   Encounter for screening examination for sexually transmitted disease 07/31/2019   Abnormal uterine bleeding (AUB) 05/09/2013   BV (bacterial vaginosis) 05/09/2013   Dysmenorrhea 05/09/2013    ONSET DATE: 2 years ago for lupus dx, neuropathy around 1.5 years ago  REFERRING DIAG: M79.2 (ICD-10-CM) - Neuralgia and neuritis, unspecified  THERAPY DIAG:  Difficulty in walking, not elsewhere classified  Muscle weakness (generalized)  Other abnormalities of gait and mobility  Rationale for Evaluation and Treatment: Rehabilitation  SUBJECTIVE:  SUBJECTIVE STATEMENT: Legs gave out on Tuesday when getting out of bed.  No injury or lasting pain from it.  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 Yes: NPRS scale: 8/10 Pain location: right and left anterior leg/thigh Pain description: pins, needles, charlie horse/cramping Aggravating factors: walking, standing Relieving factors: voltaren gel, heating pad at night  PRECAUTIONS: None  WEIGHT BEARING RESTRICTIONS: No  FALLS: Has patient fallen in last 6 months? No  LIVING ENVIRONMENT: Lives with: lives with their family Lives in: House/apartment Stairs: Yes: External: 4 steps; yes Has following equipment at home: None  PLOF:  pt  reports she has a nurse that will be coming to assist with ADL/housekeeping  PATIENT GOALS: reduce pain  OBJECTIVE:    TODAY'S TREATMENT: 08/21/22 Activity Comments  NU-step level 5 x 5 min For gentle warm-up  HEP review 100% recall  Corner balance activities -for improved postural stability and HEP additions--see below  Standing on foam -EO/EC x 30 sec -head turns EO/EC 3x -semi-tandem 2x15 sec           PATIENT EDUCATION: Education details: HEP updates Person educated: Patient Education method: Explanation and Handouts Education comprehension: verbalized understanding and needs further education  HOME EXERCISE PROGRAM: Access Code: [email or web address] URL: [email or web address] Date: 06/25/2022 Prepared by: Shary Decamp  Exercises - Supine Quad Set  - 1 x daily - 7 x weekly - 3 sets - 10 reps - 2 sec hold - Supine Lower Trunk Rotation  - 1 x daily - 7 x weekly - 3 sets - 10 reps - Prone Knee Flexion AAROM with Overpressure  - 1 x daily - 7 x weekly - 3 sets - 10 reps - Standing Gastroc Stretch at Counter  - 1 x daily - 7 x weekly - 2 sets - 60 sec hold - Corner Balance Feet Together With Eyes Open  - 1 x daily - 7 x weekly - 3 sets - 30 sec hold - Corner Balance Feet Together With Eyes Closed  - 1 x daily - 7 x weekly - 3 sets - 15-30 sec hold - Corner Balance Feet Together: Eyes Open With Head Turns  - 1 x daily - 7 x weekly - 3 reps - Corner Balance Feet Together: Eyes Closed With Head Turns  - 1 x daily - 7 x weekly - 3 reps - Semi-Tandem Corner Balance With Eyes Open  - 1 x daily - 7 x weekly - 3 sets - 15-30 sec hold  DIAGNOSTIC FINDINGS:   COGNITION: Overall cognitive status: Within functional limits for tasks assessed   SENSATION: Notes general sensory disturbance   COORDINATION: WFL  EDEMA:  None present  MUSCLE TONE: WNL  MUSCLE LENGTH: WNL     POSTURE: No Significant postural limitations  LOWER EXTREMITY ROM:     Active  Right Eval  Left Eval  Hip flexion    Hip extension    Hip abduction    Hip adduction    Hip internal rotation    Hip external rotation    Knee flexion 100 100  Knee extension 0 0  Ankle dorsiflexion    Ankle plantarflexion    Ankle inversion    Ankle eversion     (Blank rows = not tested)  Knee flexion limited by pain (stretch feeling along anterior thigh)  LOWER EXTREMITY MMT:    Grossly 5/5 to seated resisted tests  BED MOBILITY:  independent  TRANSFERS: Assistive device utilized: None  Sit to stand: Complete Independence Stand to sit: Complete Independence Chair to chair: Complete Independence Floor:  NT    STAIRS: Level of Assistance: Modified independence Stair Negotiation Technique: Alternating Pattern  with Single Rail on Right Number of Stairs: 12  Height of Stairs: 4-6 inches  Comments:   GAIT: Gait pattern: antalgic Distance walked:  Assistive device utilized: None Level of assistance: Complete Independence Comments: decreased speed  FUNCTIONAL TESTS:  5 times sit to stand: 26 sec Timed up and go (TUG): 13.66 sec 2 minute walk test: 230 ft , rates 4/10 rate of perceived exertion Walk speed: 1.9 ft/sec       GOALS: Goals reviewed with patient? Yes  SHORT TERM GOALS: Target date: 07/23/2022    Patient will be independent in HEP to improve functional outcomes Baseline: Goal status: MET  2.  Demo improved BLE strength per time of 15 sec 5xSTS Baseline: 26 sec; (08/05/22) 22 sec Goal status: IN PROGRESS  3.  Pt to teach-back pain management techniques for BLE discomfort Baseline: uses heating pad and massager on back of legs Goal status: IN PROGRESS    LONG TERM GOALS: Target date: 09/17/2022    Demo improved gait speed/tolerance per distance of 300 ft during w/ RPE not exceeding 3/10 Baseline: 230 ft RPE 4/10 Goal status: IN PROGRESS  2.  Independent with advanced HEP that may include land and aquatic-based interventions Baseline:   Goal status: IN PROGRESS    ASSESSMENT:  CLINICAL IMPRESSION: Demo excellent HEP recall for LE ROM/strength. Initiated multi-sensory balance activities to improve postural stability.  Difficulty with compliant surfaces and eyes closed conditions demo moderate-severe sway under this set-up.  Continues to be limited in activity tolerance due to LE pain/neuropathy requiring therapeutic rest periods every 3 min.  Continued sessions to advance POC details.     OBJECTIVE IMPAIRMENTS: Abnormal gait, decreased activity tolerance, decreased endurance, decreased mobility, difficulty walking, decreased strength, impaired perceived functional ability, impaired sensation, and pain.   ACTIVITY LIMITATIONS: carrying, lifting, standing, squatting, stairs, transfers, and locomotion level  PARTICIPATION LIMITATIONS: meal prep, cleaning, laundry, shopping, and community activity  PERSONAL FACTORS: Age, Time since onset of injury/illness/exacerbation, and 1-2 comorbidities: see PMH  are also affecting patient's functional outcome.   REHAB POTENTIAL: Fair based on interaction of conditions, time since onset  CLINICAL DECISION MAKING: Evolving/moderate complexity  EVALUATION COMPLEXITY: Moderate  PLAN:  PT FREQUENCY:  sessions based due to limited availability of aquatic sessions which was patient and referring physician's prerogative   PT DURATION:  8 sessions  PLANNED INTERVENTIONS: Therapeutic exercises, Therapeutic activity, Neuromuscular re-education, Balance training, Gait training, Patient/Family education, Self Care, Joint mobilization, Stair training, Vestibular training, Canalith repositioning, Orthotic/Fit training, DME instructions, Aquatic Therapy, Dry Needling, Electrical stimulation, Spinal mobilization, Cryotherapy, Moist heat, Taping, Ionotophoresis 4mg /ml Dexamethasone, and Manual therapy  PLAN FOR NEXT SESSION: functional lifts, suitcase carry   10:19 AM, 08/21/22 M. Shary Decamp,  PT, DPT Physical Therapist- [place] Office Number: [phone]

## 2022-08-24 NOTE — Progress Notes (Signed)
HPI: Dominique [name] is a 47 y.o. female who presents to the [facility] pharmacy clinic for Apretude administration and HIV PrEP follow up.  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   CAPSAICIN (ZOSTRIX) 0.025 % CREAM    Apply 1 application. topically 3 (three) times daily.   CETIRIZINE (ZYRTEC) 10 MG TABLET    Take 10 mg by mouth daily.   CITALOPRAM (CELEXA) 20 MG TABLET    Take 1 tablet by mouth daily.   CLOPIDOGREL (PLAVIX) 75 MG TABLET    Take 0.5 tablets (37.5 mg total) by mouth daily.   DEPO-SUBQ PROVERA 104 104 MG/0.65ML INJECTION    every 3 (three) months.   DICLOFENAC SODIUM (VOLTAREN) 1 % GEL    Apply 2 g topically 4 (four) times daily.   FAMOTIDINE (PEPCID) 20 MG TABLET    Take 20 mg by mouth at bedtime.   FLUTICASONE (FLONASE) 50 MCG/ACT NASAL SPRAY    Place into the nose.   FOLIC ACID (FOLVITE) 1 MG TABLET    Take 1 tablet (1 mg total) by mouth  daily.   GABAPENTIN (NEURONTIN) 600 MG TABLET    Take 600 mg by mouth 3 (three) times daily.   HYDROXYCHLOROQUINE (PLAQUENIL) 200 MG TABLET    TAKE 1 TABLET(200 MG) BY MOUTH DAILY   MELATONIN 3 MG TABS TABLET    Take by mouth.   MELOXICAM (MOBIC) 15 MG TABLET    Take 1 tablet (15 mg total) by mouth daily.   NORETHINDRONE (MICRONOR) 0.35 MG TABLET    Take 1 tablet by mouth daily.   NORTRIPTYLINE (PAMELOR) 25 MG CAPSULE    Take 1 capsule (25 mg total) by mouth at bedtime.   PREGABALIN (LYRICA) 100 MG CAPSULE    Take by mouth.   PYRIDOXINE (B-6) 250 MG TABLET    Take by mouth.   QUETIAPINE (SEROQUEL) 100 MG TABLET    Take 100 mg by mouth daily.   SULFASALAZINE (AZULFIDINE) 500 MG EC TABLET    Take 1,000 mg by mouth 2 (two) times daily.   TIZANIDINE (ZANAFLEX) 4 MG TABLET    Take 4 mg by mouth 2 (two) times daily.   VALACYCLOVIR (VALTREX) 500 MG TABLET    Take 500 mg by mouth daily.   VITAMIN D, ERGOCALCIFEROL, (DRISDOL) 1.25 MG (50000 UNIT) CAPS CAPSULE    Take 50,000 Units by mouth once a week.  Modified Medications  No medications on file  Discontinued Medications   No medications on file    Allergies: No Known Allergies  Past Medical History: Past Medical History:  Diagnosis Date   Aneurysm (HCC)    Arthritis    RA   Kidney stones    Lupus (HCC) 01/17/2021   Lupus (HCC)    Schizophrenia (HCC)    Systemic lupus erythematosus (HCC)     Social History: Social History   Socioeconomic History   Marital status: Single    Spouse name: Not on file   Number of children: Not on file   Years of education: Not on file   Highest education level: Not on file  Occupational History   Not on file  Tobacco Use   Smoking status: Former    Packs/day: 0.25    Years: 15.00    Additional pack years: 0.00    Total pack years: 3.75    Types: Cigarettes    Quit date: 07/15/2021    Years since quitting: 1.1    Passive exposure: Past   Smokeless tobacco: Never  Vaping Use   Vaping Use:  Never used  Substance and Sexual Activity   Alcohol use: Not Currently   Drug use: Not Currently    Types: Marijuana    Comment: Last use was on 07/15/21   Sexual activity: Yes    Partners: Male    Birth control/protection: Surgical, Injection  Other Topics Concern   Not on file  Social History Narrative   Right handed   Social Determinants of Health   Financial Resource Strain: Not on file  Food Insecurity: Not on file  Transportation Needs: Not on file  Physical Activity: Not on file  Stress: Not on file  Social Connections: Not on file    Labs: Lab Results  Component Value Date   HIV1RNAQUANT Not Detected 06/24/2022   HIV1RNAQUANT Not Detected 04/29/2022   HIV1RNAQUANT Not Detected 02/25/2022    RPR and STI Lab Results  Component Value Date   LABRPR REACTIVE (A) 06/24/2022   LABRPR REACTIVE (A) 04/29/2022   LABRPR REACTIVE (A) 06/25/2021   LABRPR REACTIVE (A) 12/18/2020   LABRPR Non Reactive 10/16/2019   RPRTITER 1:2 (H) 06/24/2022   RPRTITER 1:2 (H) 04/29/2022   RPRTITER 1:8 (H) 06/25/2021   RPRTITER 1:16 (H) 12/18/2020    STI Results GC GC CT CT  06/24/2022 10:42 AM Negative    Negative   Negative    Negative    04/29/2022 10:00 AM Negative   Negative    10/31/2021  9:06 AM Negative   Negative    09/02/2021  9:56 AM Negative   Negative    06/25/2021  8:33 AM Negative   Negative    01/30/2021 10:14 AM Negative   Negative    10/16/2019  3:39 PM Negative   Negative    05/09/2013 10:57 AM  NEGATIVE   NEGATIVE   12/07/2011 12:20 PM   NEGATIVE      Hepatitis B Lab Results  Component Value Date   HEPBSAB REACTIVE (A) 12/31/2021   HEPBSAG NON-REACTIVE 12/18/2020   HEPBCAB NON-REACTIVE 12/18/2020   Hepatitis C Lab Results  Component Value Date   HEPCAB NON-REACTIVE 12/18/2020   Hepatitis A Lab Results  Component Value Date   HAV NON-REACTIVE 06/24/2022   Lipids: Lab Results  Component Value Date   CHOL 154 07/28/2019   TRIG 96 07/28/2019   HDL  57 07/28/2019   CHOLHDL 2.7 07/28/2019   LDLCALC 79 07/28/2019  TARGET DATE: The 3rd   Assessment: Dominique [name] presents today for her Apretude injection and to follow up for HIV PrEP. No issues with past injections. Screened for acute HIV symptoms such as fatigue, muscle aches, rash, sore throat, lymphadenopathy, headache, night sweats, nausea/vomiting/diarrhea, and fever. Will get an HIV RNA today. Denies any symptoms. She has no known exposures to any STIs, but agrees to STI testing today with RPR and oral/urine cytologies.   Per [employer] guidelines, a rapid HIV test should be drawn prior to Apretude administration. Due to state shortage of rapid HIV tests, this is temporarily unable to be done. Per decision from [facility] physicians, we will proceed with Apretude administration at this time without a negative rapid HIV test beforehand. HIV RNA was collected today and is in process.  Administered cabotegravir 600mg /56mL in right upper outer quadrant of the gluteal muscle. Will see her back in 2 months for injection, labs, and HIV PrEP follow up.  Malaiya qualified for her hep A vaccine given a negative antibody titer at last appointment and her COVID vaccine. She accepted to receiving her hep A vaccine at today's visit and it was administered in her right deltoid.  Plan: - Apretude injection administered - HIV RNA, RPR, oral/urine cytologies today - First Hep A vaccine in series administered  - Next injection, labs, and PrEP follow up appointment scheduled for 10/29/2022 at 10:00AM with [facility] - Call with any issues or questions  Thanks,  Arabella Merles, PharmD. [facility] Acute Care PGY-1 08/26/2022 9:19 AM

## 2022-08-26 ENCOUNTER — Other Ambulatory Visit: Payer: Self-pay | Admitting: Family Medicine

## 2022-08-26 ENCOUNTER — Other Ambulatory Visit (HOSPITAL_COMMUNITY)
Admission: RE | Admit: 2022-08-26 | Discharge: 2022-08-26 | Disposition: A | Discharge status: Home / Self Care | Payer: Medicaid Other | Source: Ambulatory Visit | Attending: Infectious Disease | Admitting: Infectious Disease

## 2022-08-26 ENCOUNTER — Encounter (HOSPITAL_BASED_OUTPATIENT_CLINIC_OR_DEPARTMENT_OTHER): Payer: Self-pay | Admitting: Physical Therapy

## 2022-08-26 ENCOUNTER — Ambulatory Visit (HOSPITAL_BASED_OUTPATIENT_CLINIC_OR_DEPARTMENT_OTHER): Payer: Medicaid Other | Admitting: Physical Therapy

## 2022-08-26 ENCOUNTER — Other Ambulatory Visit: Payer: Self-pay

## 2022-08-26 ENCOUNTER — Ambulatory Visit (INDEPENDENT_AMBULATORY_CARE_PROVIDER_SITE_OTHER): Payer: Medicaid Other | Admitting: Pharmacist

## 2022-08-26 DIAGNOSIS — Z23 Encounter for immunization: Secondary | ICD-10-CM

## 2022-08-26 DIAGNOSIS — Z113 Encounter for screening for infections with a predominantly sexual mode of transmission: Secondary | ICD-10-CM | POA: Insufficient documentation

## 2022-08-26 DIAGNOSIS — Z1231 Encounter for screening mammogram for malignant neoplasm of breast: Secondary | ICD-10-CM

## 2022-08-26 DIAGNOSIS — R2689 Other abnormalities of gait and mobility: Secondary | ICD-10-CM

## 2022-08-26 DIAGNOSIS — R262 Difficulty in walking, not elsewhere classified: Secondary | ICD-10-CM

## 2022-08-26 DIAGNOSIS — M6281 Muscle weakness (generalized): Secondary | ICD-10-CM

## 2022-08-26 DIAGNOSIS — Z2981 Encounter for HIV pre-exposure prophylaxis: Secondary | ICD-10-CM | POA: Diagnosis present

## 2022-08-26 DIAGNOSIS — Z79899 Other long term (current) drug therapy: Secondary | ICD-10-CM

## 2022-08-26 MED ORDER — CABOTEGRAVIR ER 600 MG/3ML IM SUER
600.0000 mg | Freq: Once | INTRAMUSCULAR | Status: AC
Start: 2022-08-26 — End: 2022-08-26
  Administered 2022-08-26: 600 mg via INTRAMUSCULAR

## 2022-08-26 NOTE — Therapy (Signed)
OUTPATIENT PHYSICAL THERAPY NEURO TREATMENT   Patient Name: Dominique [name] MRN: [id number] DOB:Jun 23, 1975, 47 y.o., female Today's Date: 08/26/2022   PCP: Dot Been, FNP REFERRING PROVIDER: Maximiano Coss, MD  END OF SESSION:  PT End of Session - 08/26/22 1118     Visit Number 5    Number of Visits 8    Date for PT Re-Evaluation 09/17/22    Authorization Type Medicaid    Authorization Time Period auth required, submitted via Medicaid website    Authorization - Number of Visits 6    PT Start Time 1117    PT Stop Time 1155    PT Time Calculation (min) 38 min    Behavior During Therapy WFL for tasks assessed/performed              Past Medical History:  Diagnosis Date   Aneurysm (HCC)    Arthritis    RA   Kidney stones    Lupus (HCC) 01/17/2021   Lupus (HCC)    Schizophrenia (HCC)    Systemic lupus erythematosus (HCC)    Past Surgical History:  Procedure Laterality Date   CESAREAN SECTION     HERNIA REPAIR     IR 3D INDEPENDENT WKST  07/28/2021   IR ANGIO INTRA EXTRACRAN SEL INTERNAL CAROTID BILAT MOD SED  07/28/2021   IR ANGIO VERTEBRAL SEL VERTEBRAL UNI L MOD SED  07/28/2021   IR ANGIOGRAM FOLLOW UP STUDY  07/28/2021   IR CT HEAD LTD  07/28/2021   IR NEURO EACH ADD'L AFTER BASIC UNI LEFT (MS)  07/28/2021   IR RADIOLOGIST EVAL & MGMT  07/10/2021   IR RADIOLOGIST EVAL & MGMT  08/15/2021   IR TRANSCATH/EMBOLIZ  07/28/2021   IR US GUIDE VASC ACCESS RIGHT  07/28/2021   RADIOLOGY WITH ANESTHESIA N/A 07/28/2021   Procedure: EMBOLIZATION;  Surgeon: Julieanne Cotton, MD;  Location: [facility] OR;  Service: Radiology;  Laterality: N/A;   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   Brain aneurysm 07/28/2021   Bilateral ankle pain 05/20/2021   High risk medication use 04/21/2021   Herpes 01/23/2021   Polyarthralgia 01/23/2021   Rash and other nonspecific skin eruption 01/23/2021   Lupus (HCC) 01/17/2021    Neurosyphilis in adult 12/18/2020   Blurry vision, bilateral 12/18/2020   Smoking 12/18/2020   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   Right hip pain 05/16/2020   BRBPR (bright red blood per rectum) 10/18/2019   Tobacco use disorder 07/31/2019   Dyspnea, paroxysmal nocturnal 07/31/2019   Schizophrenia (HCC) 07/31/2019   Encounter for screening examination for sexually transmitted disease 07/31/2019   Abnormal uterine bleeding (AUB) 05/09/2013   BV (bacterial vaginosis) 05/09/2013   Dysmenorrhea 05/09/2013    ONSET DATE: 2 years ago for lupus dx, neuropathy around 1.5 years ago  REFERRING DIAG: M79.2 (ICD-10-CM) - Neuralgia and neuritis, unspecified  THERAPY DIAG:  Difficulty in walking, not elsewhere classified  Muscle weakness (generalized)  Other abnormalities of gait and mobility  Rationale for Evaluation and Treatment: Rehabilitation  SUBJECTIVE:  SUBJECTIVE STATEMENT: Pt reports that she was very sore in her legs the day after last aquatic session.  She reports that therapy is "helping a little".  She continues to lay in bed with heating pad for relief.   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 Yes: NPRS scale: 6/10 Pain location:bilat ankles  Pain description: pins, needles, sore/ ache Aggravating factors: walking, standing Relieving factors: voltaren gel, heating pad at night  PRECAUTIONS: None  WEIGHT BEARING RESTRICTIONS: No  FALLS: Has patient fallen in last 6 months? No  LIVING ENVIRONMENT: Lives with: lives with their family Lives in: House/apartment Stairs: Yes: External: 4 steps; yes Has following equipment at home: None  PLOF:  pt reports she has a nurse that will be coming to assist  with ADL/housekeeping  PATIENT GOALS: reduce pain  OBJECTIVE:    TODAY'S TREATMENT: 08/26/22  Pt seen for aquatic therapy today.  Treatment took place in water 3.5-4.75 ft in depth at the [facility] pool. Temp of water was 91.  Pt entered/exited the pool via stairs using step to pattern with hand rail.  *with barbell - walking forward /backward x 4 widths; - switched to solid noodle * with noodle: side stepping x 1 lap (Rt hamstring/Lateral hip pain) * holding wall: relaxed squats x 5 * seated on bench in water: single leg long sitting for hamstring stretch (R/L/R) -without/with PF/DF * STS at bench in water with cues for forward arm reach, hip hinge * return to walking in 4+ ft with noodle under arms * straddling noodle: cycling - with cues for revolutions that include straighter knee on down stroke   Pt requires the buoyancy and hydrostatic pressure of water for support, and to offload joints by unweighting joint load by at least 50 % in navel deep water and by at least 75-80% in chest to neck deep water.  Viscosity of the water is needed for resistance of strengthening. Water current perturbations provides challenge to standing balance requiring increased core activation.  08/21/22 Activity Comments  NU-step level 5 x 5 min For gentle warm-up  HEP review 100% recall  Corner balance activities -for improved postural stability and HEP additions--see below  Standing on foam -EO/EC x 30 sec -head turns EO/EC 3x -semi-tandem 2x15 sec           PATIENT EDUCATION: Education details: aquatic exercise progressions/ modifications;  list of area pools (handout) Person educated: Patient Education method: Explanation and Handouts Education comprehension: verbalized understanding and needs further education  HOME EXERCISE PROGRAM: Access Code: [email or web address] URL: [email or web address] Date: 06/25/2022 Prepared by: Shary Decamp  Exercises - Supine Quad Set  - 1 x  daily - 7 x weekly - 3 sets - 10 reps - 2 sec hold - Supine Lower Trunk Rotation  - 1 x daily - 7 x weekly - 3 sets - 10 reps - Prone Knee Flexion AAROM with Overpressure  - 1 x daily - 7 x weekly - 3 sets - 10 reps - Standing Gastroc Stretch at Counter  - 1 x daily - 7 x weekly - 2 sets - 60 sec hold - Corner Balance Feet Together With Eyes Open  - 1 x daily - 7 x weekly - 3 sets - 30 sec hold - Corner Balance Feet Together With Eyes Closed  - 1 x daily - 7 x weekly - 3 sets - 15-30 sec hold - Corner Balance Feet  Together: Eyes Open With Head Turns  - 1 x daily - 7 x weekly - 3 reps - Corner Balance Feet Together: Eyes Closed With Head Turns  - 1 x daily - 7 x weekly - 3 reps - Semi-Tandem Corner Balance With Eyes Open  - 1 x daily - 7 x weekly - 3 sets - 15-30 sec hold  DIAGNOSTIC FINDINGS:   COGNITION: Overall cognitive status: Within functional limits for tasks assessed   SENSATION: Notes general sensory disturbance   COORDINATION: WFL  EDEMA:  None present  MUSCLE TONE: WNL  MUSCLE LENGTH: WNL     POSTURE: No Significant postural limitations  LOWER EXTREMITY ROM:     Active  Right Eval Left Eval  Hip flexion    Hip extension    Hip abduction    Hip adduction    Hip internal rotation    Hip external rotation    Knee flexion 100 100  Knee extension 0 0  Ankle dorsiflexion    Ankle plantarflexion    Ankle inversion    Ankle eversion     (Blank rows = not tested)  Knee flexion limited by pain (stretch feeling along anterior thigh)  LOWER EXTREMITY MMT:    Grossly 5/5 to seated resisted tests  BED MOBILITY:  independent  TRANSFERS: Assistive device utilized: None  Sit to stand: Complete Independence Stand to sit: Complete Independence Chair to chair: Complete Independence Floor:  NT    STAIRS: Level of Assistance: Modified independence Stair Negotiation Technique: Alternating Pattern  with Single Rail on Right Number of Stairs: 12  Height of  Stairs: 4-6 inches  Comments:   GAIT: Gait pattern: antalgic Distance walked:  Assistive device utilized: None Level of assistance: Complete Independence Comments: decreased speed  FUNCTIONAL TESTS:  5 times sit to stand: 26 sec Timed up and go (TUG): 13.66 sec 2 minute walk test: 230 ft , rates 4/10 rate of perceived exertion Walk speed: 1.9 ft/sec       GOALS: Goals reviewed with patient? Yes  SHORT TERM GOALS: Target date: 07/23/2022    Patient will be independent in HEP to improve functional outcomes Baseline: Goal status: MET  2.  Demo improved BLE strength per time of 15 sec 5xSTS Baseline: 26 sec; (08/05/22) 22 sec Goal status: IN PROGRESS  3.  Pt to teach-back pain management techniques for BLE discomfort Baseline: uses heating pad and massager on back of legs Goal status: IN PROGRESS    LONG TERM GOALS: Target date: 09/17/2022    Demo improved gait speed/tolerance per distance of 300 ft during w/ RPE not exceeding 3/10 Baseline: 230 ft RPE 4/10 Goal status: IN PROGRESS  2.  Independent with advanced HEP that may include land and aquatic-based interventions Baseline:  Goal status: IN PROGRESS    ASSESSMENT:  CLINICAL IMPRESSION: Pt required frequent cues for relaxed LEs and more upright posture.  She reported resolution of back and LLE pain, but continued throbbing into RLE of 6/10 throughout session.  Issued list of area pools for pt to investigate, if receiving relief while in pool.  Pt is progressing gradually towards remaining goals.   Continued sessions to advance POC details.     OBJECTIVE IMPAIRMENTS: Abnormal gait, decreased activity tolerance, decreased endurance, decreased mobility, difficulty walking, decreased strength, impaired perceived functional ability, impaired sensation, and pain.   ACTIVITY LIMITATIONS: carrying, lifting, standing, squatting, stairs, transfers, and locomotion level  PARTICIPATION LIMITATIONS: meal prep,  cleaning, laundry, shopping, and community activity  PERSONAL FACTORS: Age, Time since onset of injury/illness/exacerbation, and 1-2 comorbidities: see PMH  are also affecting patient's functional outcome.   REHAB POTENTIAL: Fair based on interaction of conditions, time since onset  CLINICAL DECISION MAKING: Evolving/moderate complexity  EVALUATION COMPLEXITY: Moderate  PLAN:  PT FREQUENCY:  sessions based due to limited availability of aquatic sessions which was patient and referring physician's prerogative   PT DURATION:  8 sessions  PLANNED INTERVENTIONS: Therapeutic exercises, Therapeutic activity, Neuromuscular re-education, Balance training, Gait training, Patient/Family education, Self Care, Joint mobilization, Stair training, Vestibular training, Canalith repositioning, Orthotic/Fit training, DME instructions, Aquatic Therapy, Dry Needling, Electrical stimulation, Spinal mobilization, Cryotherapy, Moist heat, Taping, Ionotophoresis 4mg /ml Dexamethasone, and Manual therapy  PLAN FOR NEXT SESSION: functional lifts, suitcase carry  Mayer Camel, PTA 08/26/22 12:16 PM [facility] [address] [place], [place], [address] Phone: [phone]   Fax:  [phone]

## 2022-08-27 LAB — URINE CYTOLOGY ANCILLARY ONLY
Chlamydia: NEGATIVE
Comment: NEGATIVE
Comment: NORMAL
Neisseria Gonorrhea: NEGATIVE

## 2022-08-27 LAB — CYTOLOGY, (ORAL, ANAL, URETHRAL) ANCILLARY ONLY
Chlamydia: NEGATIVE
Comment: NEGATIVE
Comment: NORMAL
Neisseria Gonorrhea: NEGATIVE

## 2022-08-28 LAB — HIV-1 RNA QUANT-NO REFLEX-BLD: HIV 1 RNA Quant: NOT DETECTED {copies}/mL

## 2022-08-28 LAB — RPR TITER: RPR Titer: 1:2 {titer} — ABNORMAL HIGH

## 2022-08-29 LAB — T PALLIDUM AB: T Pallidum Abs: POSITIVE — AB

## 2022-08-29 LAB — HIV-1 RNA QUANT-NO REFLEX-BLD: HIV-1 RNA Quant, Log: NOT DETECTED {Log_copies}/mL

## 2022-08-29 LAB — RPR: RPR Ser Ql: REACTIVE — AB

## 2022-08-31 ENCOUNTER — Other Ambulatory Visit: Payer: Self-pay | Admitting: Neurology

## 2022-09-02 ENCOUNTER — Ambulatory Visit: Payer: Medicaid Other

## 2022-09-08 ENCOUNTER — Ambulatory Visit (HOSPITAL_BASED_OUTPATIENT_CLINIC_OR_DEPARTMENT_OTHER): Payer: Medicaid Other | Admitting: Physical Therapy

## 2022-09-08 ENCOUNTER — Other Ambulatory Visit: Payer: Self-pay | Admitting: Internal Medicine

## 2022-09-08 ENCOUNTER — Ambulatory Visit: Payer: Medicaid Other | Admitting: Rehabilitation

## 2022-09-08 NOTE — Therapy (Deleted)
OUTPATIENT PHYSICAL THERAPY NEURO TREATMENT   Patient Name: Dominique [name] MRN: [id number] DOB:[birth year], 47 y.o., female Today's Date: 09/08/2022   PCP: Dot Been, FNP REFERRING PROVIDER: Maximiano Coss, MD  END OF SESSION:  PT End of Session - 09/08/22 0830     Visit Number 6    Number of Visits 8    Date for PT Re-Evaluation 09/17/22    Authorization Type Medicaid    Authorization Time Period auth required, submitted via Medicaid website    Authorization - Number of Visits 6    Behavior During Therapy [facility] for tasks assessed/performed              Past Medical History:  Diagnosis Date   Aneurysm (HCC)    Arthritis    RA   Kidney stones    Lupus (HCC) 01/17/2021   Lupus (HCC)    Schizophrenia (HCC)    Systemic lupus erythematosus (HCC)    Past Surgical History:  Procedure Laterality Date   CESAREAN SECTION     HERNIA REPAIR     IR 3D INDEPENDENT WKST  07/28/2021   IR ANGIO INTRA EXTRACRAN SEL INTERNAL CAROTID BILAT MOD SED  07/28/2021   IR ANGIO VERTEBRAL SEL VERTEBRAL UNI L MOD SED  07/28/2021   IR ANGIOGRAM FOLLOW UP STUDY  07/28/2021   IR CT HEAD LTD  07/28/2021   IR NEURO EACH ADD'L AFTER BASIC UNI LEFT (MS)  07/28/2021   IR RADIOLOGIST EVAL & MGMT  07/10/2021   IR RADIOLOGIST EVAL & MGMT  08/15/2021   IR TRANSCATH/EMBOLIZ  07/28/2021   IR US GUIDE VASC ACCESS RIGHT  07/28/2021   RADIOLOGY WITH ANESTHESIA N/A 07/28/2021   Procedure: EMBOLIZATION;  Surgeon: Julieanne Cotton, MD;  Location: [facility] OR;  Service: Radiology;  Laterality: N/A;   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   Brain aneurysm 07/28/2021   Bilateral ankle pain 05/20/2021   High risk medication use 04/21/2021   Herpes 01/23/2021   Polyarthralgia 01/23/2021   Rash and other nonspecific skin eruption 01/23/2021   Lupus (HCC) 01/17/2021   Neurosyphilis in adult 12/18/2020   Blurry vision, bilateral 12/18/2020   Smoking  12/18/2020   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  Rationale for Evaluation and Treatment: Rehabilitation  SUBJECTIVE:  SUBJECTIVE STATEMENT: Legs gave out on Tuesday when getting out of bed.  No injury or lasting pain from it.  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 Yes: NPRS scale: 8/10 Pain location: right and left anterior leg/thigh Pain description: pins, needles, charlie horse/cramping Aggravating factors: walking, standing Relieving factors: voltaren gel, heating pad at night  PRECAUTIONS: None  WEIGHT BEARING RESTRICTIONS: No  FALLS: Has patient fallen in last 6 months? No  LIVING ENVIRONMENT: Lives with: lives with their family Lives in: House/apartment Stairs: Yes: External: 4 steps; yes Has following equipment at home: None  PLOF:  pt reports she has a nurse that will be coming to assist with ADL/housekeeping  PATIENT GOALS: reduce pain  OBJECTIVE:   Today's Treatment: 09/08/22 Patient seen for aquatic therapy today.  Treatment took place in water 3.6-4.0 feet deep depending upon activity.  Pt entered and exited the pool  via stairs using handrails.  Pool temp approx 92 deg.        Pt requires buoyancy of water for support for reduced fall risk and for unloading/reduced stress on joints as pt able to tolerate increased standing and ambulation in water compared to that on land; viscosity of water is needed for resistance for strengthening and current of water provides perturbations for challenge for balance training       PATIENT EDUCATION: Education details: HEP updates Person educated: Patient Education method: Explanation and Handouts Education comprehension: verbalized understanding and needs further education  HOME EXERCISE PROGRAM: Access Code: [email or web address] URL: [email or web address] Date: 06/25/2022 Prepared by: Shary Decamp  Exercises - Supine Quad Set  - 1 x daily - 7 x weekly - 3 sets - 10 reps - 2 sec hold - Supine Lower Trunk Rotation  - 1 x daily - 7 x weekly - 3 sets - 10 reps - Prone Knee Flexion AAROM with Overpressure  - 1 x daily - 7 x weekly - 3 sets - 10 reps - Standing Gastroc Stretch at Counter  - 1 x daily - 7 x weekly - 2 sets - 60 sec hold - Corner Balance Feet Together With Eyes Open  - 1 x daily - 7 x weekly - 3 sets - 30 sec hold - Corner Balance Feet Together With Eyes Closed  - 1 x daily - 7 x weekly - 3 sets - 15-30 sec hold - Corner Balance Feet Together: Eyes Open With Head Turns  - 1 x daily - 7 x weekly - 3 reps - Corner Balance Feet Together: Eyes Closed With Head Turns  - 1 x daily - 7 x weekly - 3 reps - Semi-Tandem Corner Balance With Eyes Open  - 1 x daily - 7 x weekly - 3 sets - 15-30 sec hold  DIAGNOSTIC FINDINGS:   COGNITION: Overall cognitive status: Within functional limits for tasks assessed   SENSATION: Notes general sensory disturbance   COORDINATION: WFL  EDEMA:  None present  MUSCLE TONE: WNL  MUSCLE LENGTH: WNL     POSTURE: No Significant postural limitations  LOWER EXTREMITY ROM:     Active  Right Eval Left Eval   Hip flexion    Hip extension    Hip abduction    Hip adduction    Hip internal rotation    Hip external rotation    Knee flexion 100 100  Knee extension 0 0  Ankle dorsiflexion  Ankle plantarflexion    Ankle inversion    Ankle eversion     (Blank rows = not tested)  Knee flexion limited by pain (stretch feeling along anterior thigh)  LOWER EXTREMITY MMT:    Grossly 5/5 to seated resisted tests  BED MOBILITY:  independent  TRANSFERS: Assistive device utilized: None  Sit to stand: Complete Independence Stand to sit: Complete Independence Chair to chair: Complete Independence Floor:  NT    STAIRS: Level of Assistance: Modified independence Stair Negotiation Technique: Alternating Pattern  with Single Rail on Right Number of Stairs: 12  Height of Stairs: 4-6 inches  Comments:   GAIT: Gait pattern: antalgic Distance walked:  Assistive device utilized: None Level of assistance: Complete Independence Comments: decreased speed  FUNCTIONAL TESTS:  5 times sit to stand: 26 sec Timed up and go (TUG): 13.66 sec 2 minute walk test: 230 ft , rates 4/10 rate of perceived exertion Walk speed: 1.9 ft/sec       GOALS: Goals reviewed with patient? Yes  SHORT TERM GOALS: Target date: 07/23/2022    Patient will be independent in HEP to improve functional outcomes Baseline: Goal status: MET  2.  Demo improved BLE strength per time of 15 sec 5xSTS Baseline: 26 sec; (08/05/22) 22 sec Goal status: IN PROGRESS  3.  Pt to teach-back pain management techniques for BLE discomfort Baseline: uses heating pad and massager on back of legs Goal status: IN PROGRESS    LONG TERM GOALS: Target date: 09/17/2022    Demo improved gait speed/tolerance per distance of 300 ft during w/ RPE not exceeding 3/10 Baseline: 230 ft RPE 4/10 Goal status: IN PROGRESS  2.  Independent with advanced HEP that may include land and aquatic-based interventions Baseline:  Goal status:  IN PROGRESS    ASSESSMENT:  CLINICAL IMPRESSION: Demo excellent HEP recall for LE ROM/strength. Initiated multi-sensory balance activities to improve postural stability.  Difficulty with compliant surfaces and eyes closed conditions demo moderate-severe sway under this set-up.  Continues to be limited in activity tolerance due to LE pain/neuropathy requiring therapeutic rest periods every 3 min.  Continued sessions to advance POC details.     OBJECTIVE IMPAIRMENTS: Abnormal gait, decreased activity tolerance, decreased endurance, decreased mobility, difficulty walking, decreased strength, impaired perceived functional ability, impaired sensation, and pain.   ACTIVITY LIMITATIONS: carrying, lifting, standing, squatting, stairs, transfers, and locomotion level  PARTICIPATION LIMITATIONS: meal prep, cleaning, laundry, shopping, and community activity  PERSONAL FACTORS: Age, Time since onset of injury/illness/exacerbation, and 1-2 comorbidities: see PMH  are also affecting patient's functional outcome.   REHAB POTENTIAL: Fair based on interaction of conditions, time since onset  CLINICAL DECISION MAKING: Evolving/moderate complexity  EVALUATION COMPLEXITY: Moderate  PLAN:  PT FREQUENCY:  sessions based due to limited availability of aquatic sessions which was patient and referring physician's prerogative   PT DURATION:  8 sessions  PLANNED INTERVENTIONS: Therapeutic exercises, Therapeutic activity, Neuromuscular re-education, Balance training, Gait training, Patient/Family education, Self Care, Joint mobilization, Stair training, Vestibular training, Canalith repositioning, Orthotic/Fit training, DME instructions, Aquatic Therapy, Dry Needling, Electrical stimulation, Spinal mobilization, Cryotherapy, Moist heat, Taping, Ionotophoresis 4mg /ml Dexamethasone, and Manual therapy  PLAN FOR NEXT SESSION: functional lifts, suitcase carry  Harriet Butte, PT, MPT [facility] [address] [place], [place], [address] Phone: [phone]   Fax:  [phone] 09/08/22, 8:31 AM

## 2022-09-08 NOTE — Telephone Encounter (Signed)
Last Fill: 04/06/2022  Labs: 08/17/2022  RBC 3.47 Hematocrit 33.6  Next Visit: 10/15/2022  Last Visit: 07/15/2022  DX: Seronegative rheumatoid arthritis   Current Dose per office note 07/15/2022: sulfasalazine 1000 mg twice daily.   Okay to refill Sulfasalazine?

## 2022-09-11 ENCOUNTER — Ambulatory Visit
Admission: RE | Admit: 2022-09-11 | Discharge: 2022-09-11 | Disposition: A | Discharge status: Home / Self Care | Payer: Medicaid Other | Source: Ambulatory Visit | Attending: Family Medicine | Admitting: Family Medicine

## 2022-09-11 DIAGNOSIS — Z1231 Encounter for screening mammogram for malignant neoplasm of breast: Secondary | ICD-10-CM

## 2022-09-15 ENCOUNTER — Ambulatory Visit: Payer: Medicaid Other | Attending: Pain Medicine | Admitting: Rehabilitation

## 2022-09-15 ENCOUNTER — Encounter: Payer: Self-pay | Admitting: Rehabilitation

## 2022-09-15 DIAGNOSIS — R2689 Other abnormalities of gait and mobility: Secondary | ICD-10-CM | POA: Diagnosis present

## 2022-09-15 DIAGNOSIS — R262 Difficulty in walking, not elsewhere classified: Secondary | ICD-10-CM | POA: Diagnosis present

## 2022-09-15 DIAGNOSIS — M6281 Muscle weakness (generalized): Secondary | ICD-10-CM | POA: Diagnosis present

## 2022-09-15 NOTE — Therapy (Signed)
OUTPATIENT PHYSICAL THERAPY NEURO TREATMENT   Patient Name: Dominique [name] MRN: [id number] DOB:January 08, 1976, 47 y.o., female Today's Date: 09/15/2022   PCP: Dot Been, FNP REFERRING PROVIDER: Maximiano Coss, MD  END OF SESSION:  PT End of Session - 09/15/22 1356     Visit Number 7    Number of Visits 8    Date for PT Re-Evaluation 09/17/22    Authorization Type Medicaid    Authorization Time Period auth required, submitted via Medicaid website    Authorization - Number of Visits 6    PT Start Time 1103    PT Stop Time 1145    PT Time Calculation (min) 42 min    Equipment Utilized During Treatment Other (comment)   floatation devices as needed for safety   Behavior During Therapy WFL for tasks assessed/performed              Past Medical History:  Diagnosis Date   Aneurysm (HCC)    Arthritis    RA   Kidney stones    Lupus (HCC) 01/17/2021   Lupus (HCC)    Schizophrenia (HCC)    Systemic lupus erythematosus (HCC)    Past Surgical History:  Procedure Laterality Date   CESAREAN SECTION     HERNIA REPAIR     IR 3D INDEPENDENT WKST  07/28/2021   IR ANGIO INTRA EXTRACRAN SEL INTERNAL CAROTID BILAT MOD SED  07/28/2021   IR ANGIO VERTEBRAL SEL VERTEBRAL UNI L MOD SED  07/28/2021   IR ANGIOGRAM FOLLOW UP STUDY  07/28/2021   IR CT HEAD LTD  07/28/2021   IR NEURO EACH ADD'L AFTER BASIC UNI LEFT (MS)  07/28/2021   IR RADIOLOGIST EVAL & MGMT  07/10/2021   IR RADIOLOGIST EVAL & MGMT  08/15/2021   IR TRANSCATH/EMBOLIZ  07/28/2021   IR US GUIDE VASC ACCESS RIGHT  07/28/2021   RADIOLOGY WITH ANESTHESIA N/A 07/28/2021   Procedure: EMBOLIZATION;  Surgeon: Julieanne Cotton, MD;  Location: [facility] OR;  Service: Radiology;  Laterality: N/A;   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   Brain aneurysm 07/28/2021   Bilateral ankle pain 05/20/2021   High risk medication use 04/21/2021   Herpes 01/23/2021   Polyarthralgia  01/23/2021   Rash and other nonspecific skin eruption 01/23/2021   Lupus (HCC) 01/17/2021   Neurosyphilis in adult 12/18/2020   Blurry vision, bilateral 12/18/2020   Smoking 12/18/2020   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   Right hip pain 05/16/2020   BRBPR (bright red blood per rectum) 10/18/2019   Tobacco use disorder 07/31/2019   Dyspnea, paroxysmal nocturnal 07/31/2019   Schizophrenia (HCC) 07/31/2019   Encounter for screening examination for sexually transmitted disease 07/31/2019   Abnormal uterine bleeding (AUB) 05/09/2013   BV (bacterial vaginosis) 05/09/2013   Dysmenorrhea 05/09/2013    ONSET DATE: 2 years ago for lupus dx, neuropathy around 1.5 years ago  REFERRING DIAG: M79.2 (ICD-10-CM) - Neuralgia and neuritis, unspecified  THERAPY DIAG:  Difficulty in walking, not elsewhere classified  Muscle weakness (generalized)  Other abnormalities of gait and mobility  Rationale for Evaluation and Treatment: Rehabilitation  SUBJECTIVE:  SUBJECTIVE STATEMENT: Pt reports that she was very sore in her legs the day after last aquatic session.  She reports that therapy is "helping a little".  She continues to lay in bed with heating pad for relief.   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 Yes: NPRS scale: 6/10 Pain location:bilat ankles  Pain description: pins, needles, sore/ ache Aggravating factors: walking, standing Relieving factors: voltaren gel, heating pad at night  PRECAUTIONS: None  WEIGHT BEARING RESTRICTIONS: No  FALLS: Has patient fallen in last 6 months? No  LIVING ENVIRONMENT: Lives with: lives with their family Lives in: House/apartment Stairs: Yes: External: 4 steps; yes Has  following equipment at home: None  PLOF:  pt reports she has a nurse that will be coming to assist with ADL/housekeeping  PATIENT GOALS: reduce pain  OBJECTIVE:    TODAY'S TREATMENT: 09/15/22  Pt seen for aquatic therapy today.  Treatment took place in water 3.5-4.75 ft in depth at the [facility] pool. Temp of water was 91.  Pt entered/exited the pool via stairs using step to pattern with hand rail.  Warm up:  Walking forwards x approx 18' x 4 laps, backwards x 4 laps and side stepping x 2 laps all without UE support.  Cues for larger and wider steps, esp with backwards stepping.    Balance:  Attempted forward marching without UE support however this caused increased pain and unsteadiness so provided single white barbell for support and marched forward x 2 laps and backwards x 2 laps.  Cues for increasing hip flexion as able and for improved proximal mm activation in stance leg.  With fatigue and esp with backwards marching, forward hip flex reduced.  Standing (still with barbell for support) hip flex/ext (with knee ext) x 10 reps with cues for less ROM and holding position for less time to avoid balance compensations.  Standing LE circles x 10 reps clockwise and 10 reps counterclockwise with emphasis on balance in stance leg.  Note improved R stance control with last two exercises despite R leg hurting more today.    Core/balance:  standing with feet together, keeping hands flat to provide resistance in water, moving them in fast alternating pattern (shoulder flex/ext) x 3 reps of 15 secs with cues for posture and keeping knees "soft."  Pt attempted doing exercise with small barbells however this provided too much resistance.  Sitting on large pool noodle "saddle" style maintaining balance>alt LAQs x 10 reps each>bicycling/paddling to other end of pool x 2 laps.  Progressed to "swing" sitting on noodle, maintaining balance x 20-30 secs>LAQs x 10 reps>alt UE raises x 10 reps.  Intermittent  min A needed during swing sitting.    Core/strengthening:  Holding yellow paddle board, performed paddle kicks x 2 laps with PT supporting at hips/pelvis to maintain horizontal position.  Standing facing pool bench, alt toe taps to top of bench with light UE support x 20 reps.    Pt requires the buoyancy and hydrostatic pressure of water for support, and to offload joints by unweighting joint load by at least 50 % in navel deep water and by at least 75-80% in chest to neck deep water.  Viscosity of the water is needed for resistance of strengthening. Water current perturbations provides challenge to standing balance requiring increased core activation.   PATIENT EDUCATION: Education details: rationale for aquatic exercises today Person educated: Patient Education method: Explanation and  Handouts Education comprehension: verbalized understanding and needs further education  HOME EXERCISE PROGRAM: Access Code: [email or web address] URL: [email or web address] Date: 06/25/2022 Prepared by: Shary Decamp  Exercises - Supine Quad Set  - 1 x daily - 7 x weekly - 3 sets - 10 reps - 2 sec hold - Supine Lower Trunk Rotation  - 1 x daily - 7 x weekly - 3 sets - 10 reps - Prone Knee Flexion AAROM with Overpressure  - 1 x daily - 7 x weekly - 3 sets - 10 reps - Standing Gastroc Stretch at Counter  - 1 x daily - 7 x weekly - 2 sets - 60 sec hold - Corner Balance Feet Together With Eyes Open  - 1 x daily - 7 x weekly - 3 sets - 30 sec hold - Corner Balance Feet Together With Eyes Closed  - 1 x daily - 7 x weekly - 3 sets - 15-30 sec hold - Corner Balance Feet Together: Eyes Open With Head Turns  - 1 x daily - 7 x weekly - 3 reps - Corner Balance Feet Together: Eyes Closed With Head Turns  - 1 x daily - 7 x weekly - 3 reps - Semi-Tandem Corner Balance With Eyes Open  - 1 x daily - 7 x weekly - 3 sets - 15-30 sec hold  DIAGNOSTIC FINDINGS:   COGNITION: Overall cognitive status: Within functional  limits for tasks assessed   SENSATION: Notes general sensory disturbance   COORDINATION: WFL  EDEMA:  None present  MUSCLE TONE: WNL  MUSCLE LENGTH: WNL     POSTURE: No Significant postural limitations  LOWER EXTREMITY ROM:     Active  Right Eval Left Eval  Hip flexion    Hip extension    Hip abduction    Hip adduction    Hip internal rotation    Hip external rotation    Knee flexion 100 100  Knee extension 0 0  Ankle dorsiflexion    Ankle plantarflexion    Ankle inversion    Ankle eversion     (Blank rows = not tested)  Knee flexion limited by pain (stretch feeling along anterior thigh)  LOWER EXTREMITY MMT:    Grossly 5/5 to seated resisted tests  BED MOBILITY:  independent  TRANSFERS: Assistive device utilized: None  Sit to stand: Complete Independence Stand to sit: Complete Independence Chair to chair: Complete Independence Floor:  NT    STAIRS: Level of Assistance: Modified independence Stair Negotiation Technique: Alternating Pattern  with Single Rail on Right Number of Stairs: 12  Height of Stairs: 4-6 inches  Comments:   GAIT: Gait pattern: antalgic Distance walked:  Assistive device utilized: None Level of assistance: Complete Independence Comments: decreased speed  FUNCTIONAL TESTS:  5 times sit to stand: 26 sec Timed up and go (TUG): 13.66 sec 2 minute walk test: 230 ft , rates 4/10 rate of perceived exertion Walk speed: 1.9 ft/sec       GOALS: Goals reviewed with patient? Yes  SHORT TERM GOALS: Target date: 07/23/2022    Patient will be independent in HEP to improve functional outcomes Baseline: Goal status: MET  2.  Demo improved BLE strength per time of 15 sec 5xSTS Baseline: 26 sec; (08/05/22) 22 sec Goal status: IN PROGRESS  3.  Pt to teach-back pain management techniques for BLE discomfort Baseline: uses heating pad and massager on back of legs Goal status: IN PROGRESS    LONG TERM GOALS: Target date:  09/17/2022  Demo improved gait speed/tolerance per distance of 300 ft during w/ RPE not exceeding 3/10 Baseline: 230 ft RPE 4/10 Goal status: IN PROGRESS  2.  Independent with advanced HEP that may include land and aquatic-based interventions Baseline:  Goal status: IN PROGRESS    ASSESSMENT:  CLINICAL IMPRESSION: Pt seen for pool session at [facility].  She continues to be limited by high pain levels today in R leg more than L leg which impairs her balance during our session.  She did seem to demonstrate improvements in balance throughout session and reports less pain at end of session. She verbalized she would like to add more PT sessions and note that she sees primary PT tomorrow.  Encouraged her to have that conversation with him tomorrow.  Pt verbalized understanding.     OBJECTIVE IMPAIRMENTS: Abnormal gait, decreased activity tolerance, decreased endurance, decreased mobility, difficulty walking, decreased strength, impaired perceived functional ability, impaired sensation, and pain.   ACTIVITY LIMITATIONS: carrying, lifting, standing, squatting, stairs, transfers, and locomotion level  PARTICIPATION LIMITATIONS: meal prep, cleaning, laundry, shopping, and community activity  PERSONAL FACTORS: Age, Time since onset of injury/illness/exacerbation, and 1-2 comorbidities: see PMH  are also affecting patient's functional outcome.   REHAB POTENTIAL: Fair based on interaction of conditions, time since onset  CLINICAL DECISION MAKING: Evolving/moderate complexity  EVALUATION COMPLEXITY: Moderate  PLAN:  PT FREQUENCY:  sessions based due to limited availability of aquatic sessions which was patient and referring physician's prerogative   PT DURATION:  8 sessions  PLANNED INTERVENTIONS: Therapeutic exercises, Therapeutic activity, Neuromuscular re-education, Balance training, Gait training, Patient/Family education, Self Care, Joint mobilization, Stair training, Vestibular  training, Canalith repositioning, Orthotic/Fit training, DME instructions, Aquatic Therapy, Dry Needling, Electrical stimulation, Spinal mobilization, Cryotherapy, Moist heat, Taping, Ionotophoresis 4mg /ml Dexamethasone, and Manual therapy  PLAN FOR NEXT SESSION: Tresa Endo she was stating that she wants to continue therapy.  I told her to have that conversation with you and add her to pool if that's what you guys decide.  This was my first time seeing her but her balance is pretty impaired still.    functional lifts, suitcase carry  Harriet Butte, PT, MPT [facility] [address] [place], [place], [address] Phone: [phone]   Fax:  [phone] 09/15/22, 1:57 PM

## 2022-09-16 ENCOUNTER — Ambulatory Visit: Payer: Medicaid Other

## 2022-09-17 ENCOUNTER — Other Ambulatory Visit: Payer: Self-pay | Admitting: Family Medicine

## 2022-09-17 DIAGNOSIS — F332 Major depressive disorder, recurrent severe without psychotic features: Secondary | ICD-10-CM | POA: Insufficient documentation

## 2022-09-17 DIAGNOSIS — R928 Other abnormal and inconclusive findings on diagnostic imaging of breast: Secondary | ICD-10-CM

## 2022-09-17 DIAGNOSIS — F4323 Adjustment disorder with mixed anxiety and depressed mood: Secondary | ICD-10-CM | POA: Insufficient documentation

## 2022-09-21 ENCOUNTER — Ambulatory Visit: Payer: Medicaid Other

## 2022-09-24 ENCOUNTER — Ambulatory Visit
Admission: RE | Admit: 2022-09-24 | Discharge: 2022-09-24 | Disposition: A | Discharge status: Home / Self Care | Payer: Medicaid Other | Source: Ambulatory Visit | Attending: Family Medicine | Admitting: Family Medicine

## 2022-09-24 ENCOUNTER — Other Ambulatory Visit: Payer: Self-pay | Admitting: Family Medicine

## 2022-09-24 DIAGNOSIS — N6310 Unspecified lump in the right breast, unspecified quadrant: Secondary | ICD-10-CM

## 2022-09-24 DIAGNOSIS — R928 Other abnormal and inconclusive findings on diagnostic imaging of breast: Secondary | ICD-10-CM

## 2022-09-29 ENCOUNTER — Ambulatory Visit
Admission: RE | Admit: 2022-09-29 | Discharge: 2022-09-29 | Disposition: A | Discharge status: Home / Self Care | Payer: MEDICAID | Source: Ambulatory Visit | Attending: Family Medicine | Admitting: Family Medicine

## 2022-09-29 DIAGNOSIS — N6310 Unspecified lump in the right breast, unspecified quadrant: Secondary | ICD-10-CM

## 2022-09-29 DIAGNOSIS — R928 Other abnormal and inconclusive findings on diagnostic imaging of breast: Secondary | ICD-10-CM

## 2022-09-29 HISTORY — PX: BREAST BIOPSY: SHX20

## 2022-10-15 ENCOUNTER — Other Ambulatory Visit (HOSPITAL_COMMUNITY): Payer: Self-pay

## 2022-10-15 ENCOUNTER — Ambulatory Visit: Payer: Medicaid Other | Admitting: Internal Medicine

## 2022-10-15 NOTE — Progress Notes (Deleted)
Office Visit Note  Patient: Dominique [name]             Date of Birth: [birth year]           MRN: [id number]             PCP: Dot Been, FNP Referring: Dot Been, FNP Visit Date: 10/15/2022   Subjective:  No chief complaint on file.   History of Present Illness: Dominique [name] is a 47 y.o. female here for follow up ***   Previous HPI 07/15/22 Dominique [name] is a 46 y.o. 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  Since her last visit she saw physical therapy completed 1 session with them so far is scheduled to follow-up planning to do some pool or other aquatic therapy.  She switched to Lyrica with pain management so far finding this more effective than the gabapentin for ongoing symptoms.  She had mildly low potassium checked in previous clinic visit was concerned if this contributed to muscle cramping issues but was improved back to normal range upon recheck.  Still getting some stiffness and soreness most noticeably at the feet and ankles and some lasting through the day but no major exacerbation.  Not seeing much visible swelling or rashes.   Previous HPI 04/15/22 Dominique [name] is a 47 y.o. female here for follow up for seronegative RA (ANA+) on hydroxychloroquine 200 mg daily and sulfasalazine 1000 mg twice.  No major flareups since her last visit.  Hyperpigmented skin changes mostly around the face may be the same or slightly worse.  Still with persistent foot and ankle pain.   Previous HPI 12/10/21 Dominique [name] is a 47 y.o. 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  Most symptoms are stable main complaint today is bilateral foot and ankle pain and swelling. No obvious difference off of HCQ. Pain is throughout the day describes as stabbing with pins type of sensation. She is seeing swelling accumulate usually by the evening and worse when  sitting or standing in fixed position for a long time.    Previous HPI 09/12/2021 Dominique [name] is a 47 y.o. 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 She feels symptoms partially improved with less knee pain but continues to have every day pain in bilateral feet and ankles. Pain is first thing in the morning as well as worsening if she is on her feet all day. Feet swelling is noticeable with prolonged standing worse later in the day. She had coil embolization procedure uneventfully. She has noticed some progression of facial rash.   Previous HPI 07/21/21 Dominique [name] is a 47 y.o. 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 So far she still has about the same amount of pain worst affected areas in both heels and in mid feet. Plantar fasciitis exercises mildly helpful for the bottom of the feet but not much. 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    Previous HPI 05/20/21 Dominique [name] is a 47 y.o. 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 So far  her symptoms are about the same. She continues having pain worst in her feet and less extent along medial side of knees. She does not see much swelling or discoloration just pain. Has not noticed any side effects of the methotrexate.   Previous HPI 01/23/21 Dominique [name] is a 47 y.o. female here for evaluation of positive ANA and elevated sedimentation rate with multiple systemic symptoms including weight loss, lymphadenopathy, paresthesias, joint pain, and headaches.  She started noticing hyperpigmented skin rashes developing on her face and extremities since several months ago does not recall specific onset or provoking episode.  Then developed worsening joint pain in multiple sites but  started around July of this year.  This involved pain affecting both hands also the hips knees and feet bilaterally.  She described morning stiffness lasting 2 or 3 hours in duration also especially pain in the bottoms of the feet first and getting out of bed.  After a month with these ongoing and somewhat worsening symptoms she was seen in primary care clinic. Workup at that time positive for trichomoniasis and syphilis and starting antibiotics also found to have high sedimentation rate with positive ANA and CCP Abs. She followed up for continued treatment last month and more recently in ID clinic with completion of antibiotic treatment for syphilis with Bicillin.  Neurology evaluation with lumbar puncture that was not suggestive for active neurosyphilis. She saw ophthalmology for visual change symptoms reports findings were not concerning for infectious or inflammatory type changes.   No Rheumatology ROS completed.   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   Right hip pain 05/16/2020   BRBPR (bright red blood per rectum) 10/18/2019   Tobacco use disorder 07/31/2019   Dyspnea, paroxysmal nocturnal 07/31/2019   Schizophrenia (HCC) 07/31/2019   Encounter for screening examination for sexually transmitted disease 07/31/2019   Abnormal uterine bleeding (AUB) 05/09/2013   BV (bacterial vaginosis) 05/09/2013   Dysmenorrhea 05/09/2013    Past Medical History:  Diagnosis Date   Aneurysm (HCC)    Arthritis    RA   Kidney stones    Lupus (HCC) 01/17/2021   Lupus  (HCC)    Schizophrenia (HCC)    Systemic lupus erythematosus (HCC)     Family History  Problem Relation Age of Onset   Stroke Mother    Multiple sclerosis Father    Hypertension Father    Diabetes Father    Breast cancer Maternal Aunt        unknown age   Dementia Maternal Grandfather    Asthma Brother    Past Surgical History:  Procedure Laterality Date   BREAST BIOPSY Right 09/29/2022   [place] RT BREAST BX W LOC DEV 1ST LESION IMG BX SPEC US GUIDE 09/29/2022 [facility]   CESAREAN SECTION     HERNIA REPAIR     IR 3D INDEPENDENT WKST  07/28/2021   IR ANGIO INTRA EXTRACRAN SEL INTERNAL CAROTID BILAT MOD SED  07/28/2021   IR ANGIO VERTEBRAL SEL VERTEBRAL UNI L MOD SED  07/28/2021   IR ANGIOGRAM FOLLOW UP STUDY  07/28/2021   IR CT HEAD LTD  07/28/2021   IR NEURO EACH ADD'L AFTER BASIC UNI LEFT (MS)  07/28/2021   IR RADIOLOGIST EVAL & MGMT  07/10/2021   IR RADIOLOGIST EVAL & MGMT  08/15/2021   IR TRANSCATH/EMBOLIZ  07/28/2021   IR US GUIDE VASC ACCESS RIGHT  07/28/2021   RADIOLOGY WITH ANESTHESIA N/A 07/28/2021   Procedure: EMBOLIZATION;  Surgeon: Julieanne Cotton, MD;  Location: [facility] OR;  Service: Radiology;  Laterality: N/A;   Social History   Social History Narrative   Right handed   Immunization History  Administered Date(s) Administered   Hepatitis A, Adult 08/26/2022   Hepb-cpg 06/25/2021, 09/02/2021   Influenza,inj,Quad PF,6+ Mos 01/30/2021, 12/31/2021   Tdap 02/25/2022     Objective: Vital Signs: There were no vitals taken for this visit.   Physical Exam   Musculoskeletal Exam: ***  CDAI Exam: CDAI Score: -- Patient Global: --; Provider Global: -- Swollen: --; Tender: -- Joint Exam 10/15/2022   No joint exam has been documented for this visit   There is currently no information documented on the homunculus. Go to the Rheumatology activity and complete the homunculus joint exam.  Investigation: No additional findings.  Imaging: [place] RT BREAST BX W LOC DEV 1ST LESION  IMG BX SPEC US GUIDE  Addendum Date: 09/30/2022   ADDENDUM REPORT: 09/30/2022 12:36 ADDENDUM: PATHOLOGY revealed: Site Breast, RIGHT, needle core biopsy, 9 o'clock, 3 cm fn, ribbon clip - MILD PSEUDOANGIOMATOUS STROMAL HYPERPLASIA. SEE NOTE - NEGATIVE FOR CARCINOMA Pathology results are CONCORDANT with imaging findings, per Dr. Amie Portland. Pathology results and recommendations below were discussed with patient by telephone on 09/30/2022. Patient reported biopsy site with slight tenderness at the site. Post biopsy care instructions were reviewed, questions were answered and my direct phone number was provided to patient. Patient was instructed to call [facility] if any concerns or questions arise related to the biopsy. RECOMMENDATION: The patient was instructed to return for annual June 2025 mammography. Pathology results reported by Lynett Grimes, RN on 09/30/2022. Electronically Signed   By: Amie Portland M.D.   On: 09/30/2022 12:36   Result Date: 09/30/2022 CLINICAL DATA:  Patient presents for ultrasound-guided core needle biopsy of a small right breast mass. EXAM: ULTRASOUND GUIDED RIGHT BREAST CORE NEEDLE BIOPSY COMPARISON:  Previous exam(s). PROCEDURE: I met with the patient and we discussed the procedure of ultrasound-guided biopsy, including benefits and alternatives. We discussed the high likelihood of a successful procedure. We discussed the risks of the procedure, including infection, bleeding, tissue injury, clip migration, and inadequate sampling. Informed written consent was given. The usual time-out protocol was performed immediately prior to the procedure. Lesion quadrant: Lower outer quadrant: Near 9 o'clock, 3 cm from the nipple. Using sterile technique and 1% Lidocaine as local anesthetic, under direct ultrasound visualization, a 14 gauge spring-loaded device was used to perform biopsy of the 5 mm mass 9 o'clock using an inferior approach. At the conclusion of the procedure a  ribbon shaped tissue marker clip was deployed into the biopsy cavity. Follow up 2 view mammogram was performed and dictated separately. IMPRESSION: Ultrasound guided biopsy of a small right breast mass. No apparent complications. Electronically Signed: By: Amie Portland M.D. On: 09/29/2022 09:19  MM CLIP PLACEMENT RIGHT  Result Date: 09/29/2022 CLINICAL DATA:  Assess post biopsy marker clip placement following ultrasound-guided core needle biopsy of a right breast mass. EXAM: 3D DIAGNOSTIC RIGHT MAMMOGRAM POST ULTRASOUND BIOPSY COMPARISON:  Previous exam(s). FINDINGS:  3D Mammographic images were obtained following ultrasound guided biopsy of a right breast mass. The biopsy marking clip is in expected position at the site of biopsy. IMPRESSION: Appropriate positioning of the ribbon shaped biopsy marking clip at the site of biopsy in the within the expected location right breast mass in the lateral breast. Final Assessment: Post Procedure Mammograms for Marker Placement Electronically Signed   By: Amie Portland M.D.   On: 09/29/2022 09:37  [place] LIMITED ULTRASOUND INCLUDING AXILLA RIGHT BREAST  Result Date: 09/24/2022 CLINICAL DATA:  Possible mass in the anterior outer right breast on a recent baseline screening mammogram. Family history of breast cancer in multiple relatives on her mother's side. EXAM: ULTRASOUND OF THE RIGHT BREAST COMPARISON:  None available. FINDINGS: Targeted ultrasound is performed, showing a 5 x 5 x 3 mm oval, horizontally oriented, circumscribed, hypoechoic mass with linear echogenic components in the 9 o'clock position of the right breast, 3 cm from the nipple. This corresponds to the mammographic mass. Internal blood flow was seen with power Doppler. Ultrasound of the right axilla demonstrated normal appearing right axillary lymph nodes. IMPRESSION: 5 mm indeterminate mass in the 9 o'clock position of the right breast. RECOMMENDATION: Ultrasound-guided core needle biopsy of the 5 mm mass  in the 9 o'clock position of the right breast. This has been discussed with the patient and scheduled at 8:30 a.m. on 09/29/2022. I have discussed the findings and recommendations with the patient. If applicable, a reminder letter will be sent to the patient regarding the next appointment. BI-RADS CATEGORY  4: Suspicious. Electronically Signed   By: Beckie Salts M.D.   On: 09/24/2022 16:20   Recent Labs: Lab Results  Component Value Date   WBC 6.8 07/15/2022   HGB 10.7 (L) 07/15/2022   PLT 212 07/15/2022   NA 139 07/15/2022   K 4.0 07/15/2022   CL 107 07/15/2022   CO2 25 07/15/2022   GLUCOSE 87 07/15/2022   BUN 8 07/15/2022   CREATININE 0.69 07/15/2022   BILITOT 0.4 07/15/2022   ALKPHOS 66 10/12/2021   AST 20 07/15/2022   ALT 18 07/15/2022   PROT 7.0 07/15/2022   ALBUMIN 4.1 10/12/2021   CALCIUM 9.1 07/15/2022   GFRAA 117 05/20/2020    Speciality Comments:  Dr. Laruth Bouchard office to fax PLQ eye exam 05/2022  Procedures:  No procedures performed Allergies: Patient has no known allergies.   Assessment / Plan:     Visit Diagnoses: No diagnosis found.  ***  Orders: No orders of the defined types were placed in this encounter.  No orders of the defined types were placed in this encounter.    Follow-Up Instructions: No follow-ups on file.   Fuller Plan, MD  Note - This record has been created using [employer].  Chart creation errors have been sought, but may not always  have been located. Such creation errors do not reflect on  the standard of medical care.

## 2022-10-16 ENCOUNTER — Other Ambulatory Visit (HOSPITAL_COMMUNITY): Payer: Self-pay

## 2022-10-19 ENCOUNTER — Ambulatory Visit (HOSPITAL_BASED_OUTPATIENT_CLINIC_OR_DEPARTMENT_OTHER): Payer: MEDICAID | Admitting: Physical Therapy

## 2022-10-20 ENCOUNTER — Telehealth: Payer: Self-pay

## 2022-10-20 NOTE — Telephone Encounter (Signed)
[facility] Patient Advocate Encounter  Patient's medication (Apretude) have been couriered to [facility] from [facility] and will be administered on the patient next office visit on 10/29/22.  Clearance Coots , CPhT [facility] Pharmacy Patient [facility] Phone: [phone] Fax:  [phone]

## 2022-10-22 ENCOUNTER — Other Ambulatory Visit (HOSPITAL_COMMUNITY): Payer: Self-pay | Admitting: Interventional Radiology

## 2022-10-22 DIAGNOSIS — I671 Cerebral aneurysm, nonruptured: Secondary | ICD-10-CM

## 2022-10-27 NOTE — Progress Notes (Signed)
Office Visit Note  Patient: Dominique [name]             Date of Birth: [birth year]           MRN: [id number]             PCP: Dot Been, FNP Referring: Dot Been, FNP Visit Date: 11/02/2022   Subjective:  Follow-up   History of Present Illness: Dominique [name] is a 47 y.o. 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  Since her last visit she has not had any major exacerbation of symptoms.  Most problematic joint still in bilateral ankles mostly pain with weightbearing.  Does not see a lot of associated swelling occasionally some that last less than 1 day duration.  Still with discolored skin rashes mostly on the face not itchy or painful.  She is noticing more bruising on her lower legs does not recall any preceding trauma to the sites.  No bruising on the upper part of her body.  She is scheduled for follow-up 79-month MRI for monitoring of aneurysm but with no new symptom complaints.  Previous HPI 07/15/2022 Dominique [name] is a 48 y.o. 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  Since her last visit she saw physical therapy completed 1 session with them so far is scheduled to follow-up planning to do some pool or other aquatic therapy.  She switched to Lyrica with pain management so far finding this more effective than the gabapentin for ongoing symptoms.  She had mildly low potassium checked in previous clinic visit was concerned if this contributed to muscle cramping issues but was improved back to normal range upon recheck.  Still getting some stiffness and soreness most noticeably at the feet and ankles and some lasting through the day but no major exacerbation.  Not seeing much visible swelling or rashes.   Previous HPI 04/15/22 Dominique [name] is a 47 y.o. female here for follow up for seronegative RA (ANA+) on  hydroxychloroquine 200 mg daily and sulfasalazine 1000 mg twice.  No major flareups since her last visit.  Hyperpigmented skin changes mostly around the face may be the same or slightly worse.  Still with persistent foot and ankle pain.   Previous HPI 12/10/21 Dominique [name] is a 47 y.o. 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  Most symptoms are stable main complaint today is bilateral foot and ankle pain and swelling. No obvious difference off of HCQ. Pain is throughout the day describes as stabbing with pins type of sensation. She is seeing swelling accumulate usually by the evening and worse when sitting or standing in fixed position for a long time.    Previous HPI 09/12/2021 Dominique [name] is a 47 y.o. 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 She feels symptoms partially improved with less knee pain but continues to have every day pain in bilateral feet and ankles. Pain is first thing in the morning as well as worsening if she is on her feet all day. Feet swelling is noticeable with prolonged standing worse later in the day. She had coil embolization procedure uneventfully. She has noticed some progression of facial rash.   Previous HPI 07/21/21 Dominique [name]  is a 47 y.o. 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 So far she still has about the same amount of pain worst affected areas in both heels and in mid feet. Plantar fasciitis exercises mildly helpful for the bottom of the feet but not much. 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    Previous HPI 05/20/21 Dominique [name] is a 47 y.o. 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 So far her symptoms are about the same. She continues  having pain worst in her feet and less extent along medial side of knees. She does not see much swelling or discoloration just pain. Has not noticed any side effects of the methotrexate.   Previous HPI 01/23/21 Dominique [name] is a 47 y.o. female here for evaluation of positive ANA and elevated sedimentation rate with multiple systemic symptoms including weight loss, lymphadenopathy, paresthesias, joint pain, and headaches.  She started noticing hyperpigmented skin rashes developing on her face and extremities since several months ago does not recall specific onset or provoking episode.  Then developed worsening joint pain in multiple sites but started around July of this year.  This involved pain affecting both hands also the hips knees and feet bilaterally.  She described morning stiffness lasting 2 or 3 hours in duration also especially pain in the bottoms of the feet first and getting out of bed.  After a month with these ongoing and somewhat worsening symptoms she was seen in primary care clinic. Workup at that time positive for trichomoniasis and syphilis and starting antibiotics also found to have high sedimentation rate with positive ANA and CCP Abs. She followed up for continued treatment last month and more recently in ID clinic with completion of antibiotic treatment for syphilis with Bicillin.  Neurology evaluation with lumbar puncture that was not suggestive for active neurosyphilis. She saw ophthalmology for visual change symptoms reports findings were not concerning for infectious or inflammatory type changes.   Review of Systems  Constitutional:  Negative for fatigue.  HENT:  Negative for mouth sores and mouth dryness.   Eyes:  Negative for dryness.  Respiratory:  Negative for shortness of breath.   Cardiovascular:  Negative for chest pain and palpitations.  Gastrointestinal:  Positive for constipation and diarrhea. Negative for blood in stool.  Endocrine: Negative for increased  urination.  Genitourinary:  Negative for involuntary urination.  Musculoskeletal:  Positive for joint pain, gait problem, joint pain, joint swelling, myalgias, muscle weakness, morning stiffness, muscle tenderness and myalgias.  Skin:  Positive for color change, rash, hair loss and sensitivity to sunlight.  Allergic/Immunologic: Positive for susceptible to infections.  Neurological:  Positive for headaches. Negative for dizziness.  Hematological:  Negative for swollen glands.  Psychiatric/Behavioral:  Positive for depressed mood and sleep disturbance. The patient is nervous/anxious.     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  Right hip pain 05/16/2020   BRBPR (bright red blood per rectum) 10/18/2019   Tobacco use disorder 07/31/2019   Dyspnea, paroxysmal nocturnal 07/31/2019   Schizophrenia (HCC) 07/31/2019   Encounter for screening examination for sexually transmitted disease 07/31/2019   Abnormal uterine bleeding (AUB) 05/09/2013   BV (bacterial vaginosis) 05/09/2013   Dysmenorrhea 05/09/2013    Past Medical History:  Diagnosis Date   Aneurysm (HCC)    Arthritis    RA   Kidney stones    Lupus (HCC) 01/17/2021   Lupus (HCC)    Schizophrenia (HCC)    Systemic lupus erythematosus (HCC)     Family History  Problem Relation Age of Onset   Stroke Mother    Multiple sclerosis Father    Hypertension Father    Diabetes Father    Breast cancer Maternal Aunt        unknown age   Dementia Maternal Grandfather    Asthma Brother    Past Surgical  History:  Procedure Laterality Date   BREAST BIOPSY Right 09/29/2022   [place] RT BREAST BX W LOC DEV 1ST LESION IMG BX SPEC US GUIDE 09/29/2022 [facility]   CESAREAN SECTION     HERNIA REPAIR     IR 3D INDEPENDENT WKST  07/28/2021   IR ANGIO INTRA EXTRACRAN SEL INTERNAL CAROTID BILAT MOD SED  07/28/2021   IR ANGIO VERTEBRAL SEL VERTEBRAL UNI L MOD SED  07/28/2021   IR ANGIOGRAM FOLLOW UP STUDY  07/28/2021   IR CT HEAD LTD  07/28/2021   IR NEURO EACH ADD'L AFTER BASIC UNI LEFT (MS)  07/28/2021   IR RADIOLOGIST EVAL & MGMT  07/10/2021   IR RADIOLOGIST EVAL & MGMT  08/15/2021   IR TRANSCATH/EMBOLIZ  07/28/2021   IR US GUIDE VASC ACCESS RIGHT  07/28/2021   RADIOLOGY WITH ANESTHESIA N/A 07/28/2021   Procedure: EMBOLIZATION;  Surgeon: Julieanne Cotton, MD;  Location: [facility] OR;  Service: Radiology;  Laterality: N/A;   Social History   Social History Narrative   Right handed   Immunization History  Administered Date(s) Administered   Hepatitis A, Adult 08/26/2022   Hepb-cpg 06/25/2021, 09/02/2021   Influenza,inj,Quad PF,6+ Mos 01/30/2021, 12/31/2021   Tdap 02/25/2022     Objective: Vital Signs: BP 125/79 (BP Location: Left Arm, Patient Position: Sitting, Cuff Size: Normal)   Pulse 88   Resp 12   Ht 5' (1.524 m)   Wt 121 lb (54.9 kg)   BMI 23.63 kg/m    Physical Exam HENT:     Mouth/Throat:     Mouth: Mucous membranes are dry.     Pharynx: Oropharynx is clear.  Eyes:     Conjunctiva/sclera: Conjunctivae normal.  Cardiovascular:     Rate and Rhythm: Normal rate and regular rhythm.  Pulmonary:     Effort: Pulmonary effort is normal.     Breath sounds: Normal breath sounds.  Musculoskeletal:     Right lower leg: No edema.     Left lower leg: No edema.  Lymphadenopathy:     Cervical: No cervical adenopathy.  Skin:    General: Skin is warm and dry.     Findings: Bruising and rash present.     Comments: Patchy hyper and hypopigmented changes on face no overlying papules or erythema   Neurological:     Mental Status: She is alert.  Psychiatric:        Mood and Affect: Mood normal.      Musculoskeletal Exam:  Shoulders full ROM no  tenderness or swelling Elbows full ROM no tenderness or swelling, left elbow crepitus Wrists full ROM no tenderness or swelling Fingers full ROM no tenderness or swelling Knees full ROM no tenderness or swelling Ankles full ROM no tenderness or swelling MTPs full ROM no tenderness or swelling   CDAI Exam: CDAI Score: 2  Patient Global: 20 / 100; Provider Global: 0 / 100 Swollen: 0 ; Tender: 0  Joint Exam 11/02/2022   All documented joints were normal     Investigation: No additional findings.  Imaging: No results found.  Recent Labs: Lab Results  Component Value Date   WBC 6.8 07/15/2022   HGB 10.7 (L) 07/15/2022   PLT 212 07/15/2022   NA 139 07/15/2022   K 4.0 07/15/2022   CL 107 07/15/2022   CO2 25 07/15/2022   GLUCOSE 87 07/15/2022   BUN 8 07/15/2022   CREATININE 0.69 07/15/2022   BILITOT 0.4 07/15/2022   ALKPHOS 66 10/12/2021   AST 20 07/15/2022   ALT 18 07/15/2022   PROT 7.0 07/15/2022   ALBUMIN 4.1 10/12/2021   CALCIUM 9.1 07/15/2022   GFRAA 117 05/20/2020    Speciality Comments:  Dr. Laruth Bouchard office to fax PLQ eye exam 05/2022  Procedures:  No procedures performed Allergies: Patient has no known allergies.   Assessment / Plan:     Visit Diagnoses: Seronegative rheumatoid arthritis (HCC) - Plan: Sedimentation rate  Inflammation appears to be well-controlled no peripheral joint synovitis appreciable on exam today.  Will recheck sedimentation rate for disease activity monitoring.  Plan is to continue hydroxychloroquine 200 mg daily and sulfasalazine 1000 mg twice daily.  High risk medication use - hydroxychloroquine 200 mg daily and sulfasalazine 1000 mg twice daily. - Plan: CBC with Differential/Platelet, COMPLETE METABOLIC PANEL WITH GFR  Checking CBC and CMP for medication monitoring on  long-term continued use of hydroxychloroquine and sulfasalazine.  Most recent ophthalmology exam from March was normal.  No serious interval infections.  She has regular follow-up with infectious disease for management of HIV and syphilis.  Brain aneurysm  Has upcoming MRI for monitoring for cerebral aneurysms secondary to neurosyphilis.  Discussed increased bruising on her legs could be contributed by hydroxychloroquine along with her maintenance Plavix but without major bleeding event or extensive bruising would just monitor for now.  Orders: Orders Placed This Encounter  Procedures   Sedimentation rate   CBC with Differential/Platelet   COMPLETE METABOLIC PANEL WITH GFR   No orders of the defined types were placed in this encounter.    Follow-Up Instructions: Return in about 3 months (around 02/02/2023) for RA on HCQ/SSZ f/u 3mos.   Fuller Plan, MD  Note - This record has been created using [employer].  Chart creation errors have been sought, but may not always  have been located. Such creation errors do not reflect on  the standard of medical care.

## 2022-10-29 ENCOUNTER — Ambulatory Visit: Payer: Medicaid Other | Admitting: Pharmacist

## 2022-10-29 ENCOUNTER — Ambulatory Visit: Payer: MEDICAID | Admitting: Physical Therapy

## 2022-10-29 ENCOUNTER — Encounter: Payer: Self-pay | Admitting: Pharmacist

## 2022-11-02 ENCOUNTER — Ambulatory Visit: Payer: MEDICAID | Attending: Pain Medicine | Admitting: Internal Medicine

## 2022-11-02 ENCOUNTER — Encounter: Payer: Self-pay | Admitting: Internal Medicine

## 2022-11-02 VITALS — BP 125/79 | HR 88 | Resp 12 | Ht 60.0 in | Wt 121.0 lb

## 2022-11-02 DIAGNOSIS — I671 Cerebral aneurysm, nonruptured: Secondary | ICD-10-CM | POA: Diagnosis not present

## 2022-11-02 DIAGNOSIS — M0600 Rheumatoid arthritis without rheumatoid factor, unspecified site: Secondary | ICD-10-CM

## 2022-11-02 DIAGNOSIS — Z79899 Other long term (current) drug therapy: Secondary | ICD-10-CM

## 2022-11-02 LAB — CBC WITH DIFFERENTIAL/PLATELET
Absolute Monocytes: 451 cells/uL (ref 200–950)
Basophils Absolute: 10 cells/uL (ref 0–200)
Basophils Relative: 0.2 %
Eosinophils Absolute: 29 cells/uL (ref 15–500)
Eosinophils Relative: 0.6 %
HCT: 28.2 % — ABNORMAL LOW (ref 35.0–45.0)
Hemoglobin: 9.4 g/dL — ABNORMAL LOW (ref 11.7–15.5)
Lymphs Abs: 2626 cells/uL (ref 850–3900)
MCH: 31.1 pg (ref 27.0–33.0)
MCHC: 33.3 g/dL (ref 32.0–36.0)
MCV: 93.4 fL (ref 80.0–100.0)
MPV: 10.1 fL (ref 7.5–12.5)
Monocytes Relative: 9.2 %
Neutro Abs: 1784 cells/uL (ref 1500–7800)
Neutrophils Relative %: 36.4 %
Platelets: 184 10*3/uL (ref 140–400)
RBC: 3.02 10*6/uL — ABNORMAL LOW (ref 3.80–5.10)
RDW: 13.2 % (ref 11.0–15.0)
Total Lymphocyte: 53.6 %
WBC: 4.9 10*3/uL (ref 3.8–10.8)

## 2022-11-02 LAB — SEDIMENTATION RATE: Sed Rate: 11 mm/h (ref 0–20)

## 2022-11-03 ENCOUNTER — Ambulatory Visit (HOSPITAL_COMMUNITY): Payer: MEDICAID

## 2022-11-04 ENCOUNTER — Ambulatory Visit: Payer: MEDICAID | Admitting: Pharmacist

## 2022-11-04 ENCOUNTER — Other Ambulatory Visit: Payer: Self-pay

## 2022-11-04 DIAGNOSIS — Z2981 Encounter for HIV pre-exposure prophylaxis: Secondary | ICD-10-CM | POA: Diagnosis not present

## 2022-11-04 DIAGNOSIS — Z79899 Other long term (current) drug therapy: Secondary | ICD-10-CM

## 2022-11-04 MED ORDER — CABOTEGRAVIR ER 600 MG/3ML IM SUER
600.0000 mg | Freq: Once | INTRAMUSCULAR | Status: AC
Start: 2022-11-04 — End: 2022-11-04
  Administered 2022-11-04: 600 mg via INTRAMUSCULAR

## 2022-11-04 NOTE — Progress Notes (Addendum)
HPI: Dominique [name] is a 47 y.o. female who presents to the [facility] pharmacy clinic for Apretude administration and HIV PrEP follow up.  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   CAPSAICIN (ZOSTRIX) 0.025 % CREAM    Apply 1 application. topically 3 (three) times daily.   CETIRIZINE (ZYRTEC) 10 MG TABLET    Take 10 mg by mouth daily.   CITALOPRAM (CELEXA) 20 MG TABLET    Take 1 tablet by mouth daily.   CLOPIDOGREL (PLAVIX) 75 MG TABLET    Take 0.5 tablets (37.5 mg total) by mouth daily.   DEPO-SUBQ PROVERA 104 104 MG/0.65ML INJECTION    every 3 (three) months.   DICLOFENAC SODIUM (VOLTAREN) 1 % GEL    Apply 2 g topically 4 (four) times daily.   FAMOTIDINE (PEPCID) 20 MG TABLET    Take 20 mg by mouth at bedtime.   FLUTICASONE (FLONASE) 50 MCG/ACT NASAL SPRAY    Place into the nose.   FOLIC ACID (FOLVITE) 1 MG TABLET    Take 1 tablet (1 mg total) by mouth  daily.   GABAPENTIN (NEURONTIN) 600 MG TABLET    Take 600 mg by mouth 3 (three) times daily.   HYDROXYCHLOROQUINE (PLAQUENIL) 200 MG TABLET    TAKE 1 TABLET(200 MG) BY MOUTH DAILY   MELATONIN 3 MG TABS TABLET    Take by mouth.   MELOXICAM (MOBIC) 15 MG TABLET    Take 1 tablet (15 mg total) by mouth daily.   MISC. DEVICES (ROLLATOR ULTRA-LIGHT) MISC    Rollator/Rolling Walker with seat, use for balance and support with ambulation   NORETHINDRONE (MICRONOR) 0.35 MG TABLET    Take 1 tablet by mouth daily.   NORTRIPTYLINE (PAMELOR) 25 MG CAPSULE    TAKE 1 CAPSULE(25 MG) BY MOUTH AT BEDTIME   PREGABALIN (LYRICA) 100 MG CAPSULE    Take by mouth.   PYRIDOXINE (B-6) 250 MG TABLET    Take by mouth.   QUETIAPINE (SEROQUEL) 100 MG TABLET    Take 100 mg by mouth daily.   SULFASALAZINE (AZULFIDINE) 500 MG EC TABLET    TAKE 2 TABLETS(1000 MG) BY MOUTH TWICE DAILY   TIZANIDINE (ZANAFLEX) 4 MG TABLET    Take 4 mg by mouth 2 (two) times daily.   VALACYCLOVIR (VALTREX) 500 MG TABLET    Take 500 mg by mouth daily.   VITAMIN D, ERGOCALCIFEROL, (DRISDOL) 1.25 MG (50000 UNIT)  CAPS CAPSULE    Take 50,000 Units by mouth once a week.  Modified Medications   No medications on file  Discontinued Medications   No medications on file    Allergies: No Known Allergies  Labs: Lab Results  Component Value Date   HIV1RNAQUANT Not Detected 08/26/2022   HIV1RNAQUANT Not Detected 06/24/2022   HIV1RNAQUANT Not Detected 04/29/2022    RPR and STI Lab Results  Component Value Date   LABRPR REACTIVE (A) 08/26/2022   LABRPR REACTIVE (A) 06/24/2022   LABRPR REACTIVE (A) 04/29/2022   LABRPR REACTIVE (A) 06/25/2021   LABRPR REACTIVE (A) 12/18/2020   RPRTITER 1:2 (H) 08/26/2022   RPRTITER 1:2 (H) 06/24/2022   RPRTITER 1:2 (H) 04/29/2022   RPRTITER 1:8 (H) 06/25/2021   RPRTITER 1:16 (H) 12/18/2020    STI Results GC GC CT CT  08/26/2022  9:14 AM Negative    Negative   Negative    Negative    06/24/2022 10:42 AM  Negative    Negative   Negative    Negative    04/29/2022 10:00 AM Negative   Negative    10/31/2021  9:06 AM Negative   Negative    09/02/2021  9:56 AM Negative   Negative    06/25/2021  8:33 AM Negative   Negative    01/30/2021 10:14 AM Negative   Negative    10/16/2019  3:39 PM Negative   Negative    05/09/2013 10:57 AM  NEGATIVE   NEGATIVE   12/07/2011 12:20 PM   NEGATIVE      Hepatitis B Lab Results  Component Value Date   HEPBSAB REACTIVE (A) 12/31/2021   HEPBSAG NON-REACTIVE 12/18/2020   HEPBCAB NON-REACTIVE 12/18/2020   Hepatitis C Lab Results  Component Value Date   HEPCAB NON-REACTIVE 12/18/2020   Hepatitis A Lab Results  Component Value Date   HAV NON-REACTIVE 06/24/2022   Lipids: Lab Results  Component Value Date   CHOL 154 07/28/2019   TRIG 96 07/28/2019   HDL 57 07/28/2019   CHOLHDL 2.7 07/28/2019   LDLCALC 79 07/28/2019    TARGET DATE: The 3rd  Assessment: Masaye presents today for her Apretude injection and to follow up for HIV PrEP. No issues with past injections. Denies any symptoms of acute HIV. Last STI screening was on 08/26/22 and was negative. No known exposures to any STIs since last visit. Politely declines STI testing today.  Per [employer] guidelines, a rapid HIV test should be drawn prior to Apretude administration. Due to state shortage of rapid HIV tests, this is temporarily unable to be done. Per decision from [facility] physicians, we will proceed with Apretude administration at this time without a negative rapid HIV test beforehand. HIV RNA was collected today and is in process.  Administered cabotegravir 600mg /98mL in left upper outer quadrant of the gluteal muscle. Will see her back in 2 months for injection, labs, and HIV PrEP follow up.  Plan: - Apretude injection administered - HIV RNA today - Next injection, labs, and PrEP follow up appointment scheduled for 12/29/22 with [facility] - Call with any issues or questions   L.  , PharmD, BCIDP, AAHIVP, CPP Clinical Pharmacist Practitioner Infectious Diseases Clinical Pharmacist [facility]

## 2022-11-09 ENCOUNTER — Ambulatory Visit (HOSPITAL_COMMUNITY)
Admission: RE | Admit: 2022-11-09 | Discharge: 2022-11-09 | Disposition: A | Discharge status: Home / Self Care | Payer: MEDICAID | Source: Ambulatory Visit | Attending: Interventional Radiology | Admitting: Interventional Radiology

## 2022-11-09 DIAGNOSIS — I671 Cerebral aneurysm, nonruptured: Secondary | ICD-10-CM | POA: Insufficient documentation

## 2022-11-13 ENCOUNTER — Ambulatory Visit: Payer: MEDICAID | Admitting: Physical Therapy

## 2022-11-17 ENCOUNTER — Other Ambulatory Visit: Payer: Self-pay | Admitting: Internal Medicine

## 2022-11-17 DIAGNOSIS — M2550 Pain in unspecified joint: Secondary | ICD-10-CM

## 2022-11-17 DIAGNOSIS — R768 Other specified abnormal immunological findings in serum: Secondary | ICD-10-CM

## 2022-11-17 DIAGNOSIS — R21 Rash and other nonspecific skin eruption: Secondary | ICD-10-CM

## 2022-11-18 NOTE — Telephone Encounter (Signed)
Last Fill: 08/17/2022  Eye exam: not on file    Labs: 11/02/2022  RBC 3.02 Hemoglobin 9.4 HCT 28.2 AST 40 ALT 30  Next Visit: 02/02/2023  Last Visit: 11/02/2022  [id number] rheumatoid arthritis   Current Dose per office note 11/02/2022: hydroxychloroquine 200 mg daily   Contacted the patient to inquire if she has had a PLQ eye exam. Patient states she had one in June this year at Dr. Dione Booze. Patient to call Dr. Dione Booze and have them fax her eye exam over to the office.   Okay to refill Plaquenil?

## 2022-11-19 ENCOUNTER — Other Ambulatory Visit: Payer: Self-pay | Admitting: Internal Medicine

## 2022-11-19 ENCOUNTER — Other Ambulatory Visit: Payer: Self-pay | Admitting: Family Medicine

## 2022-11-19 ENCOUNTER — Ambulatory Visit: Payer: MEDICAID | Attending: Pain Medicine | Admitting: Physical Therapy

## 2022-11-19 DIAGNOSIS — R2689 Other abnormalities of gait and mobility: Secondary | ICD-10-CM | POA: Diagnosis present

## 2022-11-19 DIAGNOSIS — G8929 Other chronic pain: Secondary | ICD-10-CM | POA: Diagnosis present

## 2022-11-19 DIAGNOSIS — M6281 Muscle weakness (generalized): Secondary | ICD-10-CM | POA: Diagnosis present

## 2022-11-19 DIAGNOSIS — R2681 Unsteadiness on feet: Secondary | ICD-10-CM

## 2022-11-19 DIAGNOSIS — M79672 Pain in left foot: Secondary | ICD-10-CM | POA: Diagnosis present

## 2022-11-19 DIAGNOSIS — M79671 Pain in right foot: Secondary | ICD-10-CM | POA: Diagnosis present

## 2022-11-19 DIAGNOSIS — M25562 Pain in left knee: Secondary | ICD-10-CM | POA: Insufficient documentation

## 2022-11-19 DIAGNOSIS — M25561 Pain in right knee: Secondary | ICD-10-CM | POA: Insufficient documentation

## 2022-11-19 DIAGNOSIS — R262 Difficulty in walking, not elsewhere classified: Secondary | ICD-10-CM | POA: Diagnosis present

## 2022-11-19 NOTE — Telephone Encounter (Signed)
Last Fill: 09/08/2022  Labs: 11/02/2022 AST 40, ALT 30, RBC 3.02, Hgb 9.4, Hct 28.2  Next Visit: 02/02/2023  Last Visit: 11/02/2022  DX: Seronegative rheumatoid arthritis   Current Dose per office note 11/02/2022: sulfasalazine 1000 mg twice daily   Okay to refill Sulfasalazine?

## 2022-11-19 NOTE — Telephone Encounter (Signed)
Last Fill: 09/08/2022  Labs: 11/02/2022 RBC 3.02 Hemoglobin 9.4 HCT 28.2  Next Visit: 02/02/2023  Last Visit: 11/02/2022  DX:  Seronegative rheumatoid arthritis   Current Dose per office note 11/02/2022: sulfasalazine 1000 mg twice daily   Okay to refill Sulfasalazine?

## 2022-11-19 NOTE — Therapy (Addendum)
OUTPATIENT PHYSICAL THERAPY NEURO EVALUATION   Patient Name: Dominique [name] MRN: [id number] DOB:Apr 15, 1975, 47 y.o., female Today's Date: 11/19/2022   PCP: Dot Been, FNP REFERRING PROVIDER: Alecia Lemming, PA-C  END OF SESSION:  PT End of Session - 11/19/22 1149     Visit Number 1    Number of Visits 9   with eval   Date for PT Re-Evaluation 01/28/23    Authorization Type [employer]    PT Start Time 1147    PT Stop Time 1220   eval   PT Time Calculation (min) 33 min    Equipment Utilized During Treatment Gait belt    Activity Tolerance Patient limited by pain    Behavior During Therapy WFL for tasks assessed/performed             Past Medical History:  Diagnosis Date   Aneurysm (HCC)    Arthritis    RA   Kidney stones    Lupus (HCC) 01/17/2021   Lupus (HCC)    Schizophrenia (HCC)    Systemic lupus erythematosus (HCC)    Past Surgical History:  Procedure Laterality Date   BREAST BIOPSY Right 09/29/2022   [place] RT BREAST BX W LOC DEV 1ST LESION IMG BX SPEC US GUIDE 09/29/2022 [facility]   CESAREAN SECTION     HERNIA REPAIR     IR 3D INDEPENDENT WKST  07/28/2021   IR ANGIO INTRA EXTRACRAN SEL INTERNAL CAROTID BILAT MOD SED  07/28/2021   IR ANGIO VERTEBRAL SEL VERTEBRAL UNI L MOD SED  07/28/2021   IR ANGIOGRAM FOLLOW UP STUDY  07/28/2021   IR CT HEAD LTD  07/28/2021   IR NEURO EACH ADD'L AFTER BASIC UNI LEFT (MS)  07/28/2021   IR RADIOLOGIST EVAL & MGMT  07/10/2021   IR RADIOLOGIST EVAL & MGMT  08/15/2021   IR TRANSCATH/EMBOLIZ  07/28/2021   IR US GUIDE VASC ACCESS RIGHT  07/28/2021   RADIOLOGY WITH ANESTHESIA N/A 07/28/2021   Procedure: EMBOLIZATION;  Surgeon: Julieanne Cotton, MD;  Location: [facility] OR;  Service: Radiology;  Laterality: N/A;   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   Brain aneurysm 07/28/2021   Bilateral ankle pain 05/20/2021   High risk medication use 04/21/2021   Herpes  01/23/2021   Polyarthralgia 01/23/2021   Rash and other nonspecific skin eruption 01/23/2021   Lupus (HCC) 01/17/2021   Neurosyphilis in adult 12/18/2020   Blurry vision, bilateral 12/18/2020   Smoking 12/18/2020   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   Right hip pain 05/16/2020   BRBPR (bright red blood per rectum) 10/18/2019   Tobacco use disorder 07/31/2019   Dyspnea, paroxysmal nocturnal 07/31/2019   Schizophrenia (HCC) 07/31/2019   Encounter for screening examination for sexually transmitted disease 07/31/2019   Abnormal uterine bleeding (AUB) 05/09/2013   BV (bacterial vaginosis) 05/09/2013   Dysmenorrhea 05/09/2013    ONSET DATE: 10/20/2022 (referral date)  REFERRING DIAG: M32.9 (ICD-10-CM) - Systemic lupus erythematosus, unspecified  THERAPY DIAG:  Difficulty in walking, not elsewhere classified  Muscle weakness (generalized)  Other abnormalities of gait and mobility  Unsteadiness on feet  Pain in both feet  Chronic pain of both knees  Rationale for Evaluation and Treatment: Rehabilitation  SUBJECTIVE:  SUBJECTIVE STATEMENT: Pt was doing aquatic therapy before and felt like it was helpful, interested in a combo of land and aquatic therapy this time around. Pt feels like her strength is her biggest deficits as well as pain. Pt reports that she has difficulty with walking, standing for longer periods of time, even has leg throbbing when she is sitting - comes from the lupus.  Pt reports that she fell last week trying to come up the front steps, her legs will give out on her randomly. Pt reports that her daughter had to help her up, no injuries. Pt reports that she intermittently uses a RW but can't use it on the stairs.  Pt also reports that she has trouble tolerating wearing shoes due to  hypersensitivity of skin in her feet.  Pt accompanied by: family member mom  PERTINENT HISTORY: PMH significant for schizophrenia, rheumatoid arthritis, SLE, and neuropathic pain  PAIN:  Are you having pain? Yes: NPRS scale: 5/10 Pain location: ankles and legs Pain description: achy, sharp, numbness Aggravating factors: standing Relieving factors: laying down  PRECAUTIONS: Fall  RED FLAGS: None   WEIGHT BEARING RESTRICTIONS: No  FALLS: Has patient fallen in last 6 months? Yes. Number of falls several, most recently fell on the stairs (no injuries) and her daughter had to help her back up  LIVING ENVIRONMENT: Lives with: lives with their family mom Lives in: House/apartment Stairs: Yes: External: 4 steps; bilateral but cannot reach both Has following equipment at home: Walker - 4 wheeled and shower chair  PLOF: Independent with gait, Independent with transfers, and Requires assistive device for independence  PATIENT GOALS: "to learn how to manage my strengths and weaknesses"  OBJECTIVE:   DIAGNOSTIC FINDINGS: None relevant to this POC  COGNITION: Overall cognitive status: Within functional limits for tasks assessed   SENSATION: Impaired light touch knees and below Hypersensitivity to touch in distal BLE  EDEMA:  Pt reports intermittent swelling in her BLE   LOWER EXTREMITY MMT:    MMT Right Eval Left Eval  Hip flexion 4 Unable to formally assess due to hypersensitivity  Hip extension    Hip abduction    Hip adduction    Hip internal rotation    Hip external rotation    Knee flexion 4 Unable to formally assess due to hypersensitivity  Knee extension 4 Unable to formally assess due to hypersensitivity  Ankle dorsiflexion Unable to formally assess due to hypersensitivity Unable to formally assess due to hypersensitivity  Ankle plantarflexion    Ankle inversion    Ankle eversion    (Blank rows = not tested)  BED MOBILITY:  Can require min A for bed  mobility on some days  TRANSFERS: Assistive device utilized: None  Sit to stand: Modified independence Stand to sit: Modified independence Chair to chair: Modified independence Floor:  not assessed at eval   STAIRS: Level of Assistance: Modified independence Stair Negotiation Technique: Alternating Pattern  with Bilateral Rails Number of Stairs: 4  Height of Stairs: 6  Comments: WFL  GAIT: Gait pattern: antalgic Distance walked: various clinic distances Assistive device utilized: None Level of assistance: Modified independence Comments: slightly antalgic gait pattern  FUNCTIONAL TESTS:    [facility] PT Assessment - 11/21/22 1619       Ambulation/Gait   Gait velocity 32.8 ft over 14.62 sec = 2.24 ft/sec      Standardized Balance Assessment   Standardized Balance Assessment Timed Up and Go Test;Five Times Sit to Stand    Five  times sit to stand comments  22.22 sec   hands on thighs     Timed Up and Go Test   TUG Normal TUG    Normal TUG (seconds) 14.87   no AD     Functional Gait  Assessment   Gait assessed  Yes    Gait Level Surface Walks 20 ft, slow speed, abnormal gait pattern, evidence for imbalance or deviates 10-15 in outside of the 12 in walkway width. Requires more than 7 sec to ambulate 20 ft.    Change in Gait Speed Makes only minor adjustments to walking speed, or accomplishes a change in speed with significant gait deviations, deviates 10-15 in outside the 12 in walkway width, or changes speed but loses balance but is able to recover and continue walking.    Gait with Horizontal Head Turns Performs head turns smoothly with slight change in gait velocity (eg, minor disruption to smooth gait path), deviates 6-10 in outside 12 in walkway width, or uses an assistive device.    Gait with Vertical Head Turns Performs task with slight change in gait velocity (eg, minor disruption to smooth gait path), deviates 6 - 10 in outside 12 in walkway width or uses assistive device     Gait and Pivot Turn Turns slowly, requires verbal cueing, or requires several small steps to catch balance following turn and stop    Step Over Obstacle Is able to step over one shoe box (4.5 in total height) but must slow down and adjust steps to clear box safely. May require verbal cueing.    Gait with Narrow Base of Support Ambulates less than 4 steps heel to toe or cannot perform without assistance.    Gait with Eyes Closed Walks 20 ft, uses assistive device, slower speed, mild gait deviations, deviates 6-10 in outside 12 in walkway width. Ambulates 20 ft in less than 9 sec but greater than 7 sec.    Ambulating Backwards Walks 20 ft, slow speed, abnormal gait pattern, evidence for imbalance, deviates 10-15 in outside 12 in walkway width.    Steps Alternating feet, must use rail.    Total Score 13    FGA comment: 13/30, high fall risk             TODAY'S TREATMENT:                                                                                                                              PT Evaluation    PATIENT EDUCATION: Education details: Eval findings, PT POC, results of OM and functional implications Person educated: Patient and Parent Education method: Explanation Education comprehension: verbalized understanding and needs further education  HOME EXERCISE PROGRAM: To be reviewed/revised from previous POC Access Code: [email or web address]   GOALS: Goals reviewed with patient? Yes  SHORT TERM GOALS: Target date: 12/12/2022   Pt will be independent with initial land and aquatic HEP for improved strength, balance, transfers and gait. Baseline:  Goal status: INITIAL  2.  Pt will improve 5 x STS to less than or equal to 18 seconds to demonstrate improved functional strength and transfer efficiency.  Baseline: 22.22 sec (8/22) Goal status: INITIAL  3.  Pt will improve gait velocity to at least 2.5 ft/sec for improved gait efficiency and performance at mod I level  Baseline: 2.24  ft/sec (8/22) Goal status: INITIAL  4.  Pt will improve FGA to 16/30 for decreased fall risk  Baseline: 13/30 (8/22) Goal status: INITIAL    LONG TERM GOALS: Target date: 01/02/2023   Pt will be independent with final land and aquatic HEP for improved strength, balance, transfers and gait. Baseline:  Goal status: INITIAL  2.  Pt will improve 5 x STS to less than or equal to 15 seconds to demonstrate improved functional strength and transfer efficiency.  Baseline: 22.22 sec (8/22) Goal status: INITIAL  3.  Pt will improve gait velocity to at least 2.75 ft/sec for improved gait efficiency and performance at mod I level  Baseline: 2.24 ft/sec (8/22) Goal status: INITIAL  4.  Pt will improve FGA to 19/30 for decreased fall risk  Baseline: 13/30 (8/22) Goal status: INITIAL  5.  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   Pt's PMH is significant for: schizophrenia, rheumatoid arthritis, SLE, and neuropathic pain The following deficits were present during the exam: sensory impairments in BLE, decreased BLE strength, and impaired balance. Based on her history of falls, score on 5xSTS, gait speed, score on FGA, and sensory impairments in BLE, pt is an increased risk for falls. Pt would benefit from skilled PT to address these impairments and functional limitations to maximize functional mobility independence.   OBJECTIVE IMPAIRMENTS: Abnormal gait, decreased activity tolerance, decreased balance, decreased knowledge of condition, decreased mobility, difficulty walking, decreased strength, impaired perceived functional ability, impaired sensation, and pain.   ACTIVITY LIMITATIONS: lifting, bending, standing, stairs, and transfers  PARTICIPATION LIMITATIONS: community  activity  PERSONAL FACTORS: Time since onset of injury/illness/exacerbation and 1-2 comorbidities:    schizophrenia, rheumatoid arthritis, SLE, and neuropathic painare also affecting patient's functional outcome.   REHAB POTENTIAL: Fair pt recently tried PT with minimal change in her pain symptoms  CLINICAL DECISION MAKING: Stable/uncomplicated  EVALUATION COMPLEXITY: Low  PLAN:  PT FREQUENCY: 1x/week  PT DURATION: 6 weeks  PLANNED INTERVENTIONS: Therapeutic exercises, Therapeutic activity, Neuromuscular re-education, Balance training, Gait training, Patient/Family education, Self Care, Joint mobilization, Stair training, DME instructions, Aquatic Therapy, Dry Needling, Electrical stimulation, Cryotherapy, Moist heat, Taping, Manual therapy, and Re-evaluation  PLAN FOR NEXT SESSION: Land: assess floor transfer as safe and able, review prior HEP and revise as appropriate, work on balance impairments as evidenced by FGA (narrow BOS, EC, retro gait)  Aquatic: work on management of neuropathic pain and establishing aquatic HEP (what has she been doing since d/c from last POC?)   Check all possible CPT codes: 56213 - PT Re-evaluation, 97110- Therapeutic Exercise, [id number]- Neuro Re-education, [phone] - Gait Training, [phone] - Manual Therapy, 97530 - Therapeutic Activities, [phone] - Self Care, [phone] - Electrical stimulation (Manual), [phone] - Orthotic Fit, and [id number] - Aquatic therapy    Check all conditions that are expected to impact treatment: {Conditions expected to impact treatment:None of these apply   If treatment provided at initial  evaluation, no treatment charged due to lack of authorization.         Peter Congo, PT, DPT, CSRS  11/19/2022, 12:21 PM

## 2022-11-20 ENCOUNTER — Telehealth: Payer: Self-pay | Admitting: *Deleted

## 2022-11-20 DIAGNOSIS — Z79899 Other long term (current) drug therapy: Secondary | ICD-10-CM

## 2022-11-20 MED ORDER — SULFASALAZINE 500 MG PO TBEC: 500.0000 mg | DELAYED_RELEASE_TABLET | Freq: Two times a day (BID) | ORAL | 0 refills | Status: DC

## 2022-11-20 NOTE — Progress Notes (Signed)
Blood count shows some worsening of chronic anemia down to 9.4 from 10.7. Her liver enzyme tests  are slightly outside normal with AST of 40 (normal 10-35) and ALT of 30 (normal 6-29). These can be sene related to sulfasalazine although she is on other medications too. To be on the safe side, I recommend decreasing the dose by half for now and we could recheck her labs (Hgb, LFTs) after 1 month of reducing this.

## 2022-11-20 NOTE — Telephone Encounter (Signed)
-----   Message from Jamesetta Orleans [facility] sent at 11/20/2022  7:53 AM EDT ----- Blood count shows some worsening of chronic anemia down to 9.4 from 10.7. Her liver enzyme tests  are slightly outside normal with AST of 40 (normal 10-35) and ALT of 30 (normal 6-29). These can be sene related to sulfasalazine although she is on other medications too. To be on the safe side, I recommend decreasing the dose by half for now and we could recheck her labs (Hgb, LFTs) after 1 month of reducing this.

## 2022-11-20 NOTE — Telephone Encounter (Signed)
See results not for details.

## 2022-11-20 NOTE — Telephone Encounter (Signed)
Dose adjustment recommended, please contact patient about associated result note.

## 2022-11-24 ENCOUNTER — Ambulatory Visit: Payer: MEDICAID | Admitting: Rehabilitation

## 2022-11-24 ENCOUNTER — Encounter: Payer: Self-pay | Admitting: Rehabilitation

## 2022-11-24 DIAGNOSIS — M6281 Muscle weakness (generalized): Secondary | ICD-10-CM

## 2022-11-24 DIAGNOSIS — R2689 Other abnormalities of gait and mobility: Secondary | ICD-10-CM

## 2022-11-24 DIAGNOSIS — R2681 Unsteadiness on feet: Secondary | ICD-10-CM

## 2022-11-24 DIAGNOSIS — G8929 Other chronic pain: Secondary | ICD-10-CM

## 2022-11-24 DIAGNOSIS — R262 Difficulty in walking, not elsewhere classified: Secondary | ICD-10-CM

## 2022-11-24 DIAGNOSIS — M79671 Pain in right foot: Secondary | ICD-10-CM

## 2022-11-24 NOTE — Therapy (Signed)
OUTPATIENT PHYSICAL THERAPY NEURO EVALUATION   Patient Name: Dominique [name] MRN: [id number] DOB:[birth year], 47 y.o., female Today's Date: 11/24/2022   PCP: Dot Been, FNP REFERRING PROVIDER: Alecia Lemming, PA-C  END OF SESSION:  PT End of Session - 11/24/22 [phone]     Visit Number 2    Number of Visits 9   with eval   Date for PT Re-Evaluation 01/28/23    Authorization Type [employer]    PT Start Time [phone]    PT Stop Time 0930    PT Time Calculation (min) 40 min    Equipment Utilized During Treatment Other (comment)   floatation devices as needed for safety   Activity Tolerance Patient limited by pain    Behavior During Therapy WFL for tasks assessed/performed             Past Medical History:  Diagnosis Date   Aneurysm (HCC)    Arthritis    RA   Kidney stones    Lupus (HCC) 01/17/2021   Lupus (HCC)    Schizophrenia (HCC)    Systemic lupus erythematosus (HCC)    Past Surgical History:  Procedure Laterality Date   BREAST BIOPSY Right 09/29/2022   [place] RT BREAST BX W LOC DEV 1ST LESION IMG BX SPEC US GUIDE 09/29/2022 [facility]   CESAREAN SECTION     HERNIA REPAIR     IR 3D INDEPENDENT WKST  07/28/2021   IR ANGIO INTRA EXTRACRAN SEL INTERNAL CAROTID BILAT MOD SED  07/28/2021   IR ANGIO VERTEBRAL SEL VERTEBRAL UNI L MOD SED  07/28/2021   IR ANGIOGRAM FOLLOW UP STUDY  07/28/2021   IR CT HEAD LTD  07/28/2021   IR NEURO EACH ADD'L AFTER BASIC UNI LEFT (MS)  07/28/2021   IR RADIOLOGIST EVAL & MGMT  07/10/2021   IR RADIOLOGIST EVAL & MGMT  08/15/2021   IR TRANSCATH/EMBOLIZ  07/28/2021   IR US GUIDE VASC ACCESS RIGHT  07/28/2021   RADIOLOGY WITH ANESTHESIA N/A 07/28/2021   Procedure: EMBOLIZATION;  Surgeon: Julieanne Cotton, MD;  Location: [facility] OR;  Service: Radiology;  Laterality: N/A;   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   Brain aneurysm 07/28/2021   Bilateral ankle pain 05/20/2021   High  risk medication use 04/21/2021   Herpes 01/23/2021   Polyarthralgia 01/23/2021   Rash and other nonspecific skin eruption 01/23/2021   Lupus (HCC) 01/17/2021   Neurosyphilis in adult 12/18/2020   Blurry vision, bilateral 12/18/2020   Smoking 12/18/2020   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   Right hip pain 05/16/2020   BRBPR (bright red blood per rectum) 10/18/2019   Tobacco use disorder 07/31/2019   Dyspnea, paroxysmal nocturnal 07/31/2019   Schizophrenia (HCC) 07/31/2019   Encounter for screening examination for sexually transmitted disease 07/31/2019   Abnormal uterine bleeding (AUB) 05/09/2013   BV (bacterial vaginosis) 05/09/2013   Dysmenorrhea 05/09/2013    ONSET DATE: 10/20/2022 (referral date)  REFERRING DIAG: M32.9 (ICD-10-CM) - Systemic lupus erythematosus, unspecified  THERAPY DIAG:  Difficulty in walking, not elsewhere classified  Muscle weakness (generalized)  Other abnormalities of gait and mobility  Unsteadiness on feet  Pain in both feet  Chronic pain of both knees  Rationale for Evaluation and Treatment: Rehabilitation  SUBJECTIVE:  SUBJECTIVE STATEMENT: Pt presents for first aquatic session since June.  Is excited to get in the water again to help with pain.    Pt accompanied by: family member mom  PERTINENT HISTORY: PMH significant for schizophrenia, rheumatoid arthritis, SLE, and neuropathic pain  PAIN:  Are you having pain? Yes: NPRS scale: 7/10 Pain location: ankles and feet Pain description: achy, sharp, numbness Aggravating factors: standing Relieving factors: laying down  PRECAUTIONS: Fall  RED FLAGS: None   WEIGHT BEARING RESTRICTIONS: No  FALLS: Has patient fallen in last 6 months? Yes. Number of falls several, most recently fell on the stairs (no  injuries) and her daughter had to help her back up  LIVING ENVIRONMENT: Lives with: lives with their family mom Lives in: House/apartment Stairs: Yes: External: 4 steps; bilateral but cannot reach both Has following equipment at home: Walker - 4 wheeled and shower chair  PLOF: Independent with gait, Independent with transfers, and Requires assistive device for independence  PATIENT GOALS: "to learn how to manage my strengths and weaknesses"  OBJECTIVE:   DIAGNOSTIC FINDINGS: None relevant to this POC  COGNITION: Overall cognitive status: Within functional limits for tasks assessed   SENSATION: Impaired light touch knees and below Hypersensitivity to touch in distal BLE  EDEMA:  Pt reports intermittent swelling in her BLE   LOWER EXTREMITY MMT:    MMT Right Eval Left Eval  Hip flexion 4 Unable to formally assess due to hypersensitivity  Hip extension    Hip abduction    Hip adduction    Hip internal rotation    Hip external rotation    Knee flexion 4 Unable to formally assess due to hypersensitivity  Knee extension 4 Unable to formally assess due to hypersensitivity  Ankle dorsiflexion Unable to formally assess due to hypersensitivity Unable to formally assess due to hypersensitivity  Ankle plantarflexion    Ankle inversion    Ankle eversion    (Blank rows = not tested)  BED MOBILITY:  Can require min A for bed mobility on some days  TRANSFERS: Assistive device utilized: None  Sit to stand: Modified independence Stand to sit: Modified independence Chair to chair: Modified independence Floor:  not assessed at eval   STAIRS: Level of Assistance: Modified independence Stair Negotiation Technique: Alternating Pattern  with Bilateral Rails Number of Stairs: 4  Height of Stairs: 6  Comments: WFL  GAIT: Gait pattern: antalgic Distance walked: various clinic distances Assistive device utilized: None Level of assistance: Modified independence Comments:  slightly antalgic gait pattern  FUNCTIONAL TESTS:      TODAY'S TREATMENT:                                                                                                                               Pt seen for aquatic therapy today.  Treatment took place in water 3.5-4.75 ft in depth at the [facility] pool. Temp of water was 91.  Pt entered/exited the pool  via stairs using step to pattern with hand rail.   Warm up:  Walking forwards x approx 18' x 4 laps, backwards x 4 laps and side stepping x 2 laps all without UE support.  Cues for larger and wider steps, esp with backwards stepping.  Strengthening: Side stepping with squat and UE adduction with small barbells x 2 more laps with cues for squat technique, however due to increased knee pain, had to discontinue squat but maintained having her adduct arms to body when LEs adduct which she did well.    Sit<>stand from pool bench with small step under feet x 10 reps with cues for increased forward trunk flexion and keeping LEs abd slightly as she tends to adduct them together for more support.  Attempted forward lunges however this increased knee pain, so discontinued.     Balance:  Forward marching with small barbells for support and marched forward x 2 laps and added in alt arm motions x 2 more laps.  Cues for increasing hip flexion as able and for improved posture. Standing (still with barbells for support) hip flex/ext (with knee ext) x 10 reps with cues for stopping in middle rather than performing full ant/post motion.  Standing LE circles x 10 reps clockwise and 10 reps counterclockwise with emphasis on balance in stance leg. Pt needed small break following each task to rest LEs.     Core/balance:  standing with feet together, holding yellow pool noodle initially attempted to move into water (submerge) however this was too difficult>even with smaller pool noodle was difficult but was able to perform with small barbells.  Once  submerged had her move in alt pattern flex/ext core core and balance, with cues for keeping knees soft throughout.  Sitting on large pool noodle "saddle" style maintaining balance>bicycling/paddling to other end of pool x 2 laps.    Discussed plan is to alt pool/land visits to hopefully strengthen but reduce pain.  She also has plans to go to a pool once therapy is over, therefore educated that PT would make her an HEP and we would go over towards the end of therapy POC.  Pt verbalized understanding.  Pain went from 7/10 to 5/10 in session.     Pt requires the buoyancy and hydrostatic pressure of water for support, and to offload joints by unweighting joint load by at least 50 % in navel deep water and by at least 75-80% in chest to neck deep water.  Viscosity of the water is needed for resistance of strengthening. Water current perturbations provides challenge to standing balance requiring increased core activation.      PATIENT EDUCATION: Education details: aquatic rationale, plan Person educated: Patient and Parent Education method: Explanation Education comprehension: verbalized understanding and needs further education  HOME EXERCISE PROGRAM: To be reviewed/revised from previous POC Access Code: [email or web address]   GOALS: Goals reviewed with patient? Yes  SHORT TERM GOALS: Target date: 12/12/2022   Pt will be independent with initial land and aquatic HEP for improved strength, balance, transfers and gait. Baseline: Goal status: INITIAL  2.  Pt will improve 5 x STS to less than or equal to 18 seconds to demonstrate improved functional strength and transfer efficiency.  Baseline: 22.22 sec (8/22) Goal status: INITIAL  3.  Pt will improve gait velocity to at least 2.5 ft/sec for improved gait efficiency and performance at mod I level  Baseline: 2.24 ft/sec (8/22) Goal status: INITIAL  4.  Pt will improve FGA to 16/30 for decreased  fall risk  Baseline: 13/30 (8/22) Goal status:  INITIAL    LONG TERM GOALS: Target date: 01/02/2023   Pt will be independent with final land and aquatic HEP for improved strength, balance, transfers and gait. Baseline:  Goal status: INITIAL  2.  Pt will improve 5 x STS to less than or equal to 15 seconds to demonstrate improved functional strength and transfer efficiency.  Baseline: 22.22 sec (8/22) Goal status: INITIAL  3.  Pt will improve gait velocity to at least 2.75 ft/sec for improved gait efficiency and performance at mod I level  Baseline: 2.24 ft/sec (8/22) Goal status: INITIAL  4.  Pt will improve FGA to 19/30 for decreased fall risk  Baseline: 13/30 (8/22) Goal status: INITIAL  5.  Patient will demonstrate ability to perform fall recovery at Supervision level Baseline: not assessed at eval Goal status: INITIAL   ASSESSMENT:  CLINICAL IMPRESSION: Patient is familiar with this PT and was last seen by me in June without returning.  Presents back with continued pain in LEs, esp feet with hypersensitivities.  Pt a little hesitant initially but demo'd improvement in balance/stability throughout session.  Limited by knee pain during any squat/lunge positions in water today.     OBJECTIVE IMPAIRMENTS: Abnormal gait, decreased activity tolerance, decreased balance, decreased knowledge of condition, decreased mobility, difficulty walking, decreased strength, impaired perceived functional ability, impaired sensation, and pain.   ACTIVITY LIMITATIONS: lifting, bending, standing, stairs, and transfers  PARTICIPATION LIMITATIONS: community activity  PERSONAL FACTORS: Time since onset of injury/illness/exacerbation and 1-2 comorbidities:    schizophrenia, rheumatoid arthritis, SLE, and neuropathic painare also affecting patient's functional outcome.   REHAB POTENTIAL: Fair pt recently tried PT with minimal change in her pain symptoms  CLINICAL DECISION MAKING: Stable/uncomplicated  EVALUATION COMPLEXITY: Low  PLAN:  PT  FREQUENCY: 1x/week  PT DURATION: 6 weeks  PLANNED INTERVENTIONS: Therapeutic exercises, Therapeutic activity, Neuromuscular re-education, Balance training, Gait training, Patient/Family education, Self Care, Joint mobilization, Stair training, DME instructions, Aquatic Therapy, Dry Needling, Electrical stimulation, Cryotherapy, Moist heat, Taping, Manual therapy, and Re-evaluation  PLAN FOR NEXT SESSION: Land: assess floor transfer as safe and able, review prior HEP and revise as appropriate, work on balance impairments as evidenced by FGA (narrow BOS, EC, retro gait)  Aquatic: work on Insurance account manager of neuropathic pain and establishing aquatic HEP (what has she been doing since d/c from last POC?)    Harriet Butte, PT, MPT [facility] [address] [place], [place], [address] Phone: [phone]   Fax:  [phone] 11/24/22, 11:53 AM

## 2022-11-25 ENCOUNTER — Ambulatory Visit (HOSPITAL_BASED_OUTPATIENT_CLINIC_OR_DEPARTMENT_OTHER): Payer: MEDICAID | Admitting: Physical Therapy

## 2022-11-26 NOTE — Therapy (Signed)
[facility] [address], [address] [place], [place], [address] Phone: [phone]   Fax:  [phone] PHYSICAL THERAPY DISCHARGE SUMMARY  Visits from Start of Care: 7  Current functional level related to goals / functional outcomes: Unknown, did not return for final visit/assessment   Remaining deficits: unknown   Education / Equipment: HEP   Patient agrees to discharge. Patient goals were partially met. Patient is being discharged due to not returning since the last visit.  Patient Details  Name: Dominique [name] MRN: [id number] Date of Birth: Apr 10, 1975 Referring Provider:  No ref. provider found  Encounter Date: 11/26/2022   Dion Body, PT 11/26/2022, 7:57 AM  [facility] [address], [address] [place], [place], [address] Phone: [phone]   Fax:  [phone]

## 2022-12-04 ENCOUNTER — Ambulatory Visit: Payer: MEDICAID | Admitting: Physical Therapy

## 2022-12-07 ENCOUNTER — Ambulatory Visit: Payer: MEDICAID | Attending: Physician Assistant | Admitting: Physical Therapy

## 2022-12-07 DIAGNOSIS — G8929 Other chronic pain: Secondary | ICD-10-CM | POA: Diagnosis present

## 2022-12-07 DIAGNOSIS — R2681 Unsteadiness on feet: Secondary | ICD-10-CM | POA: Insufficient documentation

## 2022-12-07 DIAGNOSIS — M25562 Pain in left knee: Secondary | ICD-10-CM | POA: Insufficient documentation

## 2022-12-07 DIAGNOSIS — M79672 Pain in left foot: Secondary | ICD-10-CM | POA: Insufficient documentation

## 2022-12-07 DIAGNOSIS — R262 Difficulty in walking, not elsewhere classified: Secondary | ICD-10-CM | POA: Diagnosis present

## 2022-12-07 DIAGNOSIS — M25561 Pain in right knee: Secondary | ICD-10-CM | POA: Diagnosis present

## 2022-12-07 DIAGNOSIS — R2689 Other abnormalities of gait and mobility: Secondary | ICD-10-CM | POA: Diagnosis present

## 2022-12-07 DIAGNOSIS — M6281 Muscle weakness (generalized): Secondary | ICD-10-CM | POA: Diagnosis present

## 2022-12-07 DIAGNOSIS — M79671 Pain in right foot: Secondary | ICD-10-CM | POA: Diagnosis present

## 2022-12-07 NOTE — Therapy (Addendum)
OUTPATIENT PHYSICAL THERAPY NEURO TREATMENT   Patient Name: Dominique [name] MRN: [id number] DOB:22-Jan-1976, 47 y.o., female Today's Date: 12/07/2022   PCP: Dot Been, FNP REFERRING PROVIDER: Alecia Lemming, PA-C  END OF SESSION:  PT End of Session - 12/07/22 1106     Visit Number 3    Number of Visits 9   with eval   Date for PT Re-Evaluation 01/28/23    Authorization Type [employer]    PT Start Time 1105   this therapist running behind   PT Stop Time 1144    PT Time Calculation (min) 39 min    Equipment Utilized During Treatment Gait belt    Activity Tolerance Patient tolerated treatment well    Behavior During Therapy WFL for tasks assessed/performed              Past Medical History:  Diagnosis Date   Aneurysm (HCC)    Arthritis    RA   Kidney stones    Lupus (HCC) 01/17/2021   Lupus (HCC)    Schizophrenia (HCC)    Systemic lupus erythematosus (HCC)    Past Surgical History:  Procedure Laterality Date   BREAST BIOPSY Right 09/29/2022   [place] RT BREAST BX W LOC DEV 1ST LESION IMG BX SPEC US GUIDE 09/29/2022 [facility]   CESAREAN SECTION     HERNIA REPAIR     IR 3D INDEPENDENT WKST  07/28/2021   IR ANGIO INTRA EXTRACRAN SEL INTERNAL CAROTID BILAT MOD SED  07/28/2021   IR ANGIO VERTEBRAL SEL VERTEBRAL UNI L MOD SED  07/28/2021   IR ANGIOGRAM FOLLOW UP STUDY  07/28/2021   IR CT HEAD LTD  07/28/2021   IR NEURO EACH ADD'L AFTER BASIC UNI LEFT (MS)  07/28/2021   IR RADIOLOGIST EVAL & MGMT  07/10/2021   IR RADIOLOGIST EVAL & MGMT  08/15/2021   IR TRANSCATH/EMBOLIZ  07/28/2021   IR US GUIDE VASC ACCESS RIGHT  07/28/2021   RADIOLOGY WITH ANESTHESIA N/A 07/28/2021   Procedure: EMBOLIZATION;  Surgeon: Julieanne Cotton, MD;  Location: [facility];  Service: Radiology;  Laterality: N/A;   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   Brain aneurysm 07/28/2021   Bilateral ankle pain 05/20/2021   High risk  medication use 04/21/2021   Herpes 01/23/2021   Polyarthralgia 01/23/2021   Rash and other nonspecific skin eruption 01/23/2021   Lupus (HCC) 01/17/2021   Neurosyphilis in adult 12/18/2020   Blurry vision, bilateral 12/18/2020   Smoking 12/18/2020   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   Right hip pain 05/16/2020   BRBPR (bright red blood per rectum) 10/18/2019   Tobacco use disorder 07/31/2019   Dyspnea, paroxysmal nocturnal 07/31/2019   Schizophrenia (HCC) 07/31/2019   Encounter for screening examination for sexually transmitted disease 07/31/2019   Abnormal uterine bleeding (AUB) 05/09/2013   BV (bacterial vaginosis) 05/09/2013   Dysmenorrhea 05/09/2013    ONSET DATE: 10/20/2022 (referral date)  REFERRING DIAG: M32.9 (ICD-10-CM) - Systemic lupus erythematosus, unspecified  THERAPY DIAG:  Difficulty in walking, not elsewhere classified  Muscle weakness (generalized)  Other abnormalities of gait and mobility  Unsteadiness on feet  Pain in both feet  Chronic pain of both knees  Rationale for Evaluation and Treatment: Rehabilitation  SUBJECTIVE:  SUBJECTIVE STATEMENT: Pt reports she had one fall since last visit, she fell trying to catch her grandson. Pt reports that she bruised the side of her R leg but no other injuries. Pt reports she was able to get back up from the floor herself. Pt reports her pain is doing better today, 4-5/10, the cooler weather helps. Pt felt good after last pool session.  Pt not able to tolerate wearing tennis shoes today due to nerve pain in her feet, wearing slippers.  Pt accompanied by: family member mom and grandson  PERTINENT HISTORY: PMH significant for schizophrenia, rheumatoid arthritis, SLE, and neuropathic pain  PAIN:  Are you having pain? Yes: NPRS  scale: 5/10 Pain location: ankles and feet Pain description: achy, sharp, numbness Aggravating factors: standing Relieving factors: laying down  PRECAUTIONS: Fall  RED FLAGS: None   WEIGHT BEARING RESTRICTIONS: No  FALLS: Has patient fallen in last 6 months? Yes. Number of falls several, most recently fell on the stairs (no injuries) and her daughter had to help her back up  LIVING ENVIRONMENT: Lives with: lives with their family mom Lives in: House/apartment Stairs: Yes: External: 4 steps; bilateral but cannot reach both Has following equipment at home: Walker - 4 wheeled and shower chair  PLOF: Independent with gait, Independent with transfers, and Requires assistive device for independence  PATIENT GOALS: "to learn how to manage my strengths and weaknesses"  OBJECTIVE:   DIAGNOSTIC FINDINGS: None relevant to this POC  COGNITION: Overall cognitive status: Within functional limits for tasks assessed   SENSATION: Impaired light touch knees and below Hypersensitivity to touch in distal BLE  EDEMA:  Pt reports intermittent swelling in her BLE   LOWER EXTREMITY MMT:    MMT Right Eval Left Eval  Hip flexion 4 Unable to formally assess due to hypersensitivity  Hip extension    Hip abduction    Hip adduction    Hip internal rotation    Hip external rotation    Knee flexion 4 Unable to formally assess due to hypersensitivity  Knee extension 4 Unable to formally assess due to hypersensitivity  Ankle dorsiflexion Unable to formally assess due to hypersensitivity Unable to formally assess due to hypersensitivity  Ankle plantarflexion    Ankle inversion    Ankle eversion    (Blank rows = not tested)   TODAY'S TREATMENT:                                                                                                                               TherAct For STG assessment:  [facility] PT Assessment - 12/07/22 1119       Ambulation/Gait   Gait velocity 32.8 ft over  12.65 sec = 2.59 ft/sec      Standardized Balance Assessment   Standardized Balance Assessment Five Times Sit to Stand    Five times sit to stand comments  21.28 sec   hands on thighs     Functional  Gait  Assessment   Gait assessed  Yes    Gait Level Surface Walks 20 ft, slow speed, abnormal gait pattern, evidence for imbalance or deviates 10-15 in outside of the 12 in walkway width. Requires more than 7 sec to ambulate 20 ft.    Change in Gait Speed Able to change speed, demonstrates mild gait deviations, deviates 6-10 in outside of the 12 in walkway width, or no gait deviations, unable to achieve a major change in velocity, or uses a change in velocity, or uses an assistive device.    Gait with Horizontal Head Turns Performs head turns smoothly with slight change in gait velocity (eg, minor disruption to smooth gait path), deviates 6-10 in outside 12 in walkway width, or uses an assistive device.    Gait with Vertical Head Turns Performs task with slight change in gait velocity (eg, minor disruption to smooth gait path), deviates 6 - 10 in outside 12 in walkway width or uses assistive device    Gait and Pivot Turn Turns slowly, requires verbal cueing, or requires several small steps to catch balance following turn and stop    Step Over Obstacle Is able to step over one shoe box (4.5 in total height) but must slow down and adjust steps to clear box safely. May require verbal cueing.    Gait with Narrow Base of Support Is able to ambulate for 10 steps heel to toe with no staggering.    Gait with Eyes Closed Walks 20 ft, slow speed, abnormal gait pattern, evidence for imbalance, deviates 10-15 in outside 12 in walkway width. Requires more than 9 sec to ambulate 20 ft.    Ambulating Backwards Walks 20 ft, uses assistive device, slower speed, mild gait deviations, deviates 6-10 in outside 12 in walkway width.    Steps Alternating feet, no rail.    Total Score 18    FGA comment: 18/30, high fall risk              Floor Recovery: Patient educated in floor recovery this visit using teach-back for injury assessment and sequencing of task in clinic setting.  Discussion of transfer of skills to variable scenarios outside the clinic.  Patient has most difficulty with pushing up from half-kneel position.  Performed 3 times. Level of Assist:  Verbal/tactile cues.    To review prior HEP and work on static standing balance: Corner balance with no UE support Romberg stance normal, EO, EC, EO with vertical and horizontal head turns, EC with vertical and horizontal head turns No difficulty with this so d/c from HEP Tandem stance EO 3 x 30 sec each Added to HEP, see bolded below  NMR In half kneeling on red mat on floor with no UE support and CGA: 6# weighted ball punch-outs 2 x 10 reps in L/R half-kneel 6# weighted ball diagonals 2 x 10 reps in L/R half-kneel     PATIENT EDUCATION: Education details: results of OM and functional implications, HEP Person educated: Patient Education method: Explanation, Demonstration, and Handouts Education comprehension: verbalized understanding, returned demonstration, and needs further education  HOME EXERCISE PROGRAM: To be reviewed/revised from previous POC Access Code: [email or web address]   New HEP 12/07/22: Access Code: [email or web address] URL: [email or web address] Date: 12/07/2022 Prepared by: Peter Congo  Exercises - Tandem Stance  - 1 x daily - 7 x weekly - 1 sets - 5 reps - 30 sec hold  GOALS: Goals reviewed with patient? Yes  SHORT TERM GOALS: Target date: 12/12/2022   Pt will  be independent with initial land and aquatic HEP for improved strength, balance, transfers and gait. Baseline: Goal status: NOT MET, initiated 9/9  2.  Pt will improve 5 x STS to less than or equal to 18 seconds to demonstrate improved functional strength and transfer efficiency.  Baseline: 22.22 sec (8/22), 21.28 sec (9/9) Goal status: IN PROGRESS  3.  Pt  will improve gait velocity to at least 2.5 ft/sec for improved gait efficiency and performance at mod I level  Baseline: 2.24 ft/sec (8/22), 2.59 ft/sec (9/9) Goal status: MET  4.  Pt will improve FGA to 16/30 for decreased fall risk  Baseline: 13/30 (8/22), 18/30 (9/9) Goal status: MET    LONG TERM GOALS: Target date: 01/02/2023   Pt will be independent with final land and aquatic HEP for improved strength, balance, transfers and gait. Baseline:  Goal status: INITIAL  2.  Pt will improve 5 x STS to less than or equal to 18 seconds to demonstrate improved functional strength and transfer efficiency.  Baseline: 22.22 sec (8/22), 21.28 sec (9/9) Goal status: REVISED  3.  Pt will improve gait velocity to at least 2.75 ft/sec for improved gait efficiency and performance at mod I level  Baseline: 2.24 ft/sec (8/22), 2.59 ft/sec (9/9) Goal status: INITIAL  4.  Pt will improve FGA to 19/30 for decreased fall risk  Baseline: 13/30 (8/22), 18/30 (9/9) Goal status: INITIAL  5.  Patient will demonstrate ability to perform fall recovery at Supervision level Baseline: Supervision when assessed (9/9) Goal status: MET   ASSESSMENT:  CLINICAL IMPRESSION: Emphasis of skilled PT session on assessing STG and initiating a new HEP for this POC. Pt has met 2/4 goals assessed and is making progress towards remaining 2/4 goals. Pt did not meet STG to be independent with initial HEPs as she had not yet attended enough sessions for an HEP to be created. Additionally, she did not quite meet her 5xSTS goal though she did improve her time on this test, indicating improved functional LE strength. Pt continues to be limited by her ongoing nerve pain in BLE but remains very motivated. She continues to benefit from skilled therapy services to work towards improving her balance and increasing her independence with management of pain symptoms. Continue POC.      OBJECTIVE IMPAIRMENTS: Abnormal gait, decreased  activity tolerance, decreased balance, decreased knowledge of condition, decreased mobility, difficulty walking, decreased strength, impaired perceived functional ability, impaired sensation, and pain.   ACTIVITY LIMITATIONS: lifting, bending, standing, stairs, and transfers  PARTICIPATION LIMITATIONS: community activity  PERSONAL FACTORS: Time since onset of injury/illness/exacerbation and 1-2 comorbidities:    schizophrenia, rheumatoid arthritis, SLE, and neuropathic painare also affecting patient's functional outcome.   REHAB POTENTIAL: Fair pt recently tried PT with minimal change in her pain symptoms  CLINICAL DECISION MAKING: Stable/uncomplicated  EVALUATION COMPLEXITY: Low  PLAN:  PT FREQUENCY: 1x/week  PT DURATION: 6 weeks  PLANNED INTERVENTIONS: Therapeutic exercises, Therapeutic activity, Neuromuscular re-education, Balance training, Gait training, Patient/Family education, Self Care, Joint mobilization, Stair training, DME instructions, Aquatic Therapy, Dry Needling, Electrical stimulation, Cryotherapy, Moist heat, Taping, Manual therapy, and Re-evaluation  PLAN FOR NEXT SESSION: Land: review prior HEP and revise as appropriate, work on balance impairments as evidenced by FGA (narrow BOS, EC, retro gait), add to HEP for dynamic standing balance, quadruped?  Aquatic: work on Insurance account manager of neuropathic pain and establishing aquatic HEP (what has she been doing since d/c from last POC?)   Check all possible CPT codes: 57846 -  PT Re-evaluation, 97110- Therapeutic Exercise, [phone]- Neuro Re-education, [phone] - Gait Training, 95284 - Manual Therapy, [phone] - Therapeutic Activities, [phone] - Self Care, [phone] - Electrical stimulation (Manual), [phone] - Orthotic Fit, and [phone] - Aquatic therapy    Check all conditions that are expected to impact treatment: {Conditions expected to impact treatment:None of these apply     Peter Congo, PT, DPT, CSRS  [facility] [address] [place], [place], [address] Phone: [phone]   Fax:  [phone] 12/07/22, 11:45 AM

## 2022-12-15 ENCOUNTER — Ambulatory Visit: Payer: MEDICAID | Admitting: Rehabilitation

## 2022-12-17 ENCOUNTER — Ambulatory Visit: Payer: MEDICAID | Admitting: Physical Therapy

## 2022-12-17 ENCOUNTER — Other Ambulatory Visit (HOSPITAL_COMMUNITY): Payer: Self-pay

## 2022-12-22 ENCOUNTER — Telehealth: Payer: Self-pay

## 2022-12-22 ENCOUNTER — Ambulatory Visit: Payer: MEDICAID | Admitting: Rehabilitation

## 2022-12-22 NOTE — Telephone Encounter (Signed)
[facility] Patient Advocate Encounter  Patient's medications (Apretude)  have been couriered to [facility] from [facility] and will be administered at patient appointment on  12/29/22.  Kae Heller, CPhT Specialty Pharmacy Patient [facility] for Infectious Disease Phone: [phone] Fax:  [phone]

## 2022-12-23 ENCOUNTER — Ambulatory Visit (INDEPENDENT_AMBULATORY_CARE_PROVIDER_SITE_OTHER): Payer: MEDICAID | Admitting: Internal Medicine

## 2022-12-23 ENCOUNTER — Encounter: Payer: Self-pay | Admitting: Internal Medicine

## 2022-12-23 VITALS — BP 120/80 | HR 69 | Ht 60.0 in | Wt 123.2 lb

## 2022-12-23 DIAGNOSIS — E0590 Thyrotoxicosis, unspecified without thyrotoxic crisis or storm: Secondary | ICD-10-CM

## 2022-12-23 DIAGNOSIS — D509 Iron deficiency anemia, unspecified: Secondary | ICD-10-CM

## 2022-12-23 LAB — CBC WITH DIFFERENTIAL/PLATELET
Basophils Absolute: 0 10*3/uL (ref 0.0–0.1)
Basophils Relative: 0.1 % (ref 0.0–3.0)
Eosinophils Absolute: 0 10*3/uL (ref 0.0–0.7)
Eosinophils Relative: 0.7 % (ref 0.0–5.0)
HCT: 31.2 % — ABNORMAL LOW (ref 36.0–46.0)
Hemoglobin: 10.2 g/dL — ABNORMAL LOW (ref 12.0–15.0)
Lymphocytes Relative: 32.8 % (ref 12.0–46.0)
Lymphs Abs: 2.4 10*3/uL (ref 0.7–4.0)
MCHC: 32.8 g/dL (ref 30.0–36.0)
MCV: 99 fl (ref 78.0–100.0)
Monocytes Absolute: 0.3 10*3/uL (ref 0.1–1.0)
Monocytes Relative: 4.7 % (ref 3.0–12.0)
Neutro Abs: 4.6 10*3/uL (ref 1.4–7.7)
Neutrophils Relative %: 61.7 % (ref 43.0–77.0)
Platelets: 175 10*3/uL (ref 150.0–400.0)
RBC: 3.15 Mil/uL — ABNORMAL LOW (ref 3.87–5.11)
RDW: 14.1 % (ref 11.5–15.5)
WBC: 7.4 10*3/uL (ref 4.0–10.5)

## 2022-12-23 LAB — COMPREHENSIVE METABOLIC PANEL
ALT: 13 U/L (ref 0–35)
AST: 17 U/L (ref 0–37)
Albumin: 4 g/dL (ref 3.5–5.2)
Alkaline Phosphatase: 58 U/L (ref 39–117)
BUN: 9 mg/dL (ref 6–23)
CO2: 24 mEq/L (ref 19–32)
Calcium: 8.9 mg/dL (ref 8.4–10.5)
Chloride: 109 mEq/L (ref 96–112)
Creatinine, Ser: 0.68 mg/dL (ref 0.40–1.20)
GFR: 104.55 mL/min (ref >60.00)
Glucose, Bld: 90 mg/dL (ref 70–99)
Potassium: 3.7 mEq/L (ref 3.5–5.1)
Sodium: 139 mEq/L (ref 135–145)
Total Bilirubin: 0.4 mg/dL (ref 0.2–1.2)
Total Protein: 6.5 g/dL (ref 6.0–8.3)

## 2022-12-23 LAB — TSH: TSH: 0.3 u[IU]/mL — ABNORMAL LOW (ref 0.35–5.50)

## 2022-12-23 LAB — T3, FREE: T3, Free: 2.7 pg/mL (ref 2.3–4.2)

## 2022-12-23 LAB — T4, FREE: Free T4: 0.67 ng/dL (ref 0.60–1.60)

## 2022-12-23 NOTE — Progress Notes (Unsigned)
Name: Dominique [name]  MRN/ DOB: [id number], Jun 02, 1975    Age/ Sex: 47 y.o., female    PCP: Dot Been, FNP   Reason for Endocrinology Evaluation: Subclinical hyperthyroidism     Date of Initial Endocrinology Evaluation: 12/23/2022     HPI: Dominique [name] is a 47 y.o. female with a past medical history of brain aneurysm, seronegative rheumatoid arthritis, schizophrenia and SLE. The patient presented for initial endocrinology clinic visit on 12/23/2022 for consultative assistance with her subclinical hyperthyroidism.   Patient was diagnosed with subclinical hyperthyroidism during routine follow-up with her PCP in October 2023 with a low TSH of 0.148 u IU/mL, normal T4 at 6.1 UG/DL.  Repeat TFTs continue to show low TSH 0.213 u IU/mL with normal free T4 at 1.28 NG/DL on February 06, 2022.  No Cardiac arrhythmia  No Osteoporosis    Weight has been stable  Has noted local neck swelling for the past few month, no pain recently  but had pain in the past  Has occasional palpitations  Denies diarrhea  Has occasional tremors  Has anxiety  Patient has fatigue Denies eye symptoms   No biotin  No FH of thyroid disease      HISTORY:  Past Medical History:  Past Medical History:  Diagnosis Date   Aneurysm (HCC)    Arthritis    RA   Kidney stones    Lupus (HCC) 01/17/2021   Lupus (HCC)    Schizophrenia (HCC)    Systemic lupus erythematosus (HCC)    Past Surgical History:  Past Surgical History:  Procedure Laterality Date   BREAST BIOPSY Right 09/29/2022   [place] RT BREAST BX W LOC DEV 1ST LESION IMG BX SPEC US GUIDE 09/29/2022 [facility]MO[facility]   CESAREAN SECTION     HERNIA REPAIR     IR 3D INDEPENDENT WKST  07/28/2021   IR ANGIO INTRA EXTRACRAN SEL INTERNAL CAROTID BILAT MOD SED  07/28/2021   IR ANGIO VERTEBRAL SEL VERTEBRAL UNI L MOD SED  07/28/2021   IR ANGIOGRAM FOLLOW UP STUDY  07/28/2021   IR CT HEAD LTD  07/28/2021   IR NEURO EACH ADD'L AFTER BASIC UNI LEFT (MS)   07/28/2021   IR RADIOLOGIST EVAL & MGMT  07/10/2021   IR RADIOLOGIST EVAL & MGMT  08/15/2021   IR TRANSCATH/EMBOLIZ  07/28/2021   IR US GUIDE VASC ACCESS RIGHT  07/28/2021   RADIOLOGY WITH ANESTHESIA N/A 07/28/2021   Procedure: EMBOLIZATION;  Surgeon: Julieanne Cotton, MD;  Location: [facility] OR;  Service: Radiology;  Laterality: N/A;    Social History:  reports that she quit smoking about 17 months ago. Her smoking use included cigarettes. She started smoking about 16 years ago. She has a 3.8 pack-year smoking history. She has been exposed to tobacco smoke. She has never used smokeless tobacco. She reports that she does not currently use alcohol. She reports that she does not currently use drugs after having used the following drugs: Marijuana. Family History: family history includes Asthma in her brother; Breast cancer in her maternal aunt; Dementia in her maternal grandfather; Diabetes in her father; Hypertension in her father; Multiple sclerosis in her father; Stroke in her mother.   HOME MEDICATIONS: Allergies as of 12/23/2022       Reactions   Other         Medication List        Accurate as of December 23, 2022  9:19 AM. If you have any questions, ask your  nurse or doctor.          Apretude 600 MG/3ML injection Generic drug: cabotegravir ER Inject 3 mLs (600 mg total) into the muscle every 2 (two) months.   capsaicin 0.025 % cream Commonly known as: ZOSTRIX Apply 1 application. topically 3 (three) times daily.   cetirizine 10 MG tablet Commonly known as: ZYRTEC Take 10 mg by mouth daily.   citalopram 20 MG tablet Commonly known as: CELEXA Take 1 tablet by mouth daily.   clopidogrel 75 MG tablet Commonly known as: Plavix Take 0.5 tablets (37.5 mg total) by mouth daily.   Depo-SubQ Provera 104 104 MG/0.65ML injection Generic drug: medroxyPROGESTERone every 3 (three) months.   Depo-SubQ Provera 104 104 MG/0.65ML injection Generic drug: medroxyPROGESTERone Inject into  the skin.   diclofenac Sodium 1 % Gel Commonly known as: Voltaren Apply 2 g topically 4 (four) times daily.   famotidine 20 MG tablet Commonly known as: PEPCID Take 20 mg by mouth at bedtime.   fluticasone 50 MCG/ACT nasal spray Commonly known as: FLONASE Place into the nose.   folic acid 1 MG tablet Commonly known as: FOLVITE Take 1 tablet (1 mg total) by mouth daily.   folic acid 1 MG tablet Commonly known as: FOLVITE Take 1 tablet by mouth daily.   gabapentin 600 MG tablet Commonly known as: NEURONTIN Take 600 mg by mouth 3 (three) times daily.   hydroxychloroquine 200 MG tablet Commonly known as: PLAQUENIL TAKE 1 TABLET(200 MG) BY MOUTH DAILY   meloxicam 15 MG tablet Commonly known as: MOBIC Take 1 tablet (15 mg total) by mouth daily.   metroNIDAZOLE 500 MG tablet Commonly known as: FLAGYL Take 1 tablet by mouth 2 (two) times daily.   norethindrone 0.35 MG tablet Commonly known as: MICRONOR Take 1 tablet by mouth daily.   nortriptyline 25 MG capsule Commonly known as: PAMELOR TAKE 1 CAPSULE(25 MG) BY MOUTH AT BEDTIME   pregabalin 100 MG capsule Commonly known as: LYRICA Take by mouth.   pregabalin 100 MG capsule Commonly known as: LYRICA Take 100 mg by mouth 3 (three) times daily.   QUEtiapine 100 MG tablet Commonly known as: SEROQUEL Take 100 mg by mouth daily.   Rollator Ultra-Light Misc Rollator/Rolling Walker with seat, use for balance and support with ambulation   sulfaSALAzine 500 MG EC tablet Commonly known as: AZULFIDINE Take 1 tablet (500 mg total) by mouth 2 (two) times daily.   tiZANidine 4 MG tablet Commonly known as: ZANAFLEX Take 4 mg by mouth 2 (two) times daily.   valACYclovir 500 MG tablet Commonly known as: VALTREX Take 500 mg by mouth daily.   Vitamin D (Ergocalciferol) 1.25 MG (50000 UNIT) Caps capsule Commonly known as: DRISDOL Take 50,000 Units by mouth once a week.          REVIEW OF SYSTEMS: A comprehensive  ROS was conducted with the patient and is negative except as per HPI     OBJECTIVE:  VS: BP 120/80 (BP Location: Left Arm, Patient Position: Sitting, Cuff Size: Small)   Pulse 69   Ht 5' (1.524 m)   Wt 123 lb 3.2 oz (55.9 kg)   SpO2 99%   BMI 24.06 kg/m    Wt Readings from Last 3 Encounters:  12/23/22 123 lb 3.2 oz (55.9 kg)  11/02/22 121 lb (54.9 kg)  07/15/22 122 lb (55.3 kg)     EXAM: General: Pt appears well and is in NAD  Eyes: External eye exam normal without stare, lid  lag or exophthalmos.  EOM intact.  PERRL.  Neck: General: Supple without adenopathy. Thyroid: Thyroid size normal.  No goiter or nodules appreciated. No thyroid bruit.  Left lobe tenderness noted on exam today  Lungs: Clear with good BS bilat   Heart: Auscultation: RRR.  Abdomen: Soft, nontender  Extremities:  BL LE: No pretibial edema   Mental Status: Judgment, insight: Intact Orientation: Oriented to time, place, and person Mood and affect: No depression, anxiety, or agitation     DATA REVIEWED: ***    ASSESSMENT/PLAN/RECOMMENDATIONS:   Subclinical Hyperthyroidism:  -Patient with symptoms of fatigue, occasional palpitations and tremors -Patient does endorse local neck symptoms, will proceed with thyroid ultrasound -We discussed differential diagnosis to include subacute thyroiditis, Graves' disease, autonomous thyroid nodule -We discussed that Graves' Disease is a result of an autoimmune condition involving the thyroid.    We discussed with pt the benefits of methimazole in the Tx of hyperthyroidism, as well as the possible side effects/complications of anti-thyroid drug Tx (specifically detailing the rare, but serious side effect of agranulocytosis). She was informed of need for regular thyroid function monitoring while on methimazole to ensure appropriate dosage without over-treatment. As well, we discussed the possible side effects of methimazole including the chance of rash, the small  chance of liver irritation/juandice and the <=1 in 300-400 chance of sudden onset agranulocytosis.  We discussed importance of going to ED promptly (and stopping methimazole) if shewere to develop significant fever with severe sore throat of other evidence of acute infection.     We extensively discussed the various treatment options for hyperthyroidism and Graves disease including ablation therapy with radioactive iodine versus antithyroid drug treatment versus surgical therapy.  We recommended to the patient that we felt, at this time, that *** therapy would be most optimal.  We discussed the various possible benefits versus side effects of the various therapies.   I carefully explained to the patient that one of the consequences of I-131 ablation treatment would likely be permanent hypothyroidism which would require long-term replacement therapy with LT4.   Medications :  Signed electronically by: Lyndle Herrlich, MD  [facility] Endocrinology  [facility] [address] [place], [place] [address] Phone: [phone] FAX: [phone]   CC: Dot Been, FNP [address] [place] [address] Phone: [phone] Fax: [phone]   Return to Endocrinology clinic as below: Future Appointments  Date Time Provider Department Center  12/23/2022  9:30 AM Shaiden Aldous, Konrad Dolores, MD [facility] None  12/28/2022 11:00 AM Peter Congo, PT [facility]  12/29/2022  2:45 PM Jennette Kettle, RPH-CPP [facility]  01/04/2023  8:45 AM Peter Congo, PT [facility]  02/02/2023  2:00 PM Dimple Casey, Jamesetta Orleans, MD [facility] None

## 2022-12-24 DIAGNOSIS — E0590 Thyrotoxicosis, unspecified without thyrotoxic crisis or storm: Secondary | ICD-10-CM | POA: Insufficient documentation

## 2022-12-24 DIAGNOSIS — D509 Iron deficiency anemia, unspecified: Secondary | ICD-10-CM | POA: Insufficient documentation

## 2022-12-25 LAB — TRAB (TSH RECEPTOR BINDING ANTIBODY): TRAB: <1 [IU]/L (ref <=2.00)

## 2022-12-28 ENCOUNTER — Ambulatory Visit: Payer: MEDICAID | Admitting: Physical Therapy

## 2022-12-28 NOTE — Progress Notes (Unsigned)
HPI: Dominique [name] is a 47 y.o. female who presents to the [facility] pharmacy clinic for Apretude administration and HIV PrEP follow up.  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   CAPSAICIN (ZOSTRIX) 0.025 % CREAM    Apply 1 application. topically 3 (three) times daily.   CETIRIZINE (ZYRTEC) 10 MG TABLET    Take 10 mg by mouth daily.   CITALOPRAM (CELEXA) 20 MG TABLET    Take 1 tablet by mouth daily.   CLOPIDOGREL (PLAVIX) 75 MG TABLET    Take 0.5 tablets (37.5 mg total) by mouth daily.   DEPO-SUBQ PROVERA 104 104 MG/0.65ML INJECTION    every 3 (three) months.   DICLOFENAC SODIUM (VOLTAREN) 1 % GEL    Apply 2 g topically 4 (four) times daily.   FAMOTIDINE (PEPCID) 20 MG TABLET    Take 20 mg by mouth at bedtime.   FLUTICASONE (FLONASE) 50 MCG/ACT NASAL SPRAY    Place into the  nose.   FOLIC ACID (FOLVITE) 1 MG TABLET    Take 1 tablet (1 mg total) by mouth daily.   FOLIC ACID (FOLVITE) 1 MG TABLET    Take 1 tablet by mouth daily.   GABAPENTIN (NEURONTIN) 600 MG TABLET    Take 600 mg by mouth 3 (three) times daily.   HYDROXYCHLOROQUINE (PLAQUENIL) 200 MG TABLET    TAKE 1 TABLET(200 MG) BY MOUTH DAILY   MEDROXYPROGESTERONE (DEPO-SUBQ PROVERA 104) 104 MG/0.65ML INJECTION    Inject into the skin.   MELOXICAM (MOBIC) 15 MG TABLET    Take 1 tablet (15 mg total) by mouth daily.   MISC. DEVICES (ROLLATOR ULTRA-LIGHT) MISC    Rollator/Rolling Walker with seat, use for balance and support with ambulation   NORETHINDRONE (MICRONOR) 0.35 MG TABLET    Take 1 tablet by mouth daily.   NORTRIPTYLINE (PAMELOR) 25 MG CAPSULE    TAKE 1 CAPSULE(25 MG) BY MOUTH AT BEDTIME   PREGABALIN (LYRICA) 100 MG CAPSULE    Take by mouth.   PREGABALIN (LYRICA) 100 MG CAPSULE    Take 100 mg by mouth 3 (three) times daily.   QUETIAPINE (SEROQUEL) 100 MG TABLET    Take 100 mg by mouth daily.   SULFASALAZINE (AZULFIDINE) 500 MG EC TABLET    Take 1 tablet (500 mg total) by mouth 2 (two) times daily.   TIZANIDINE (ZANAFLEX)  4 MG TABLET    Take 4 mg by mouth 2 (two) times daily.   VALACYCLOVIR (VALTREX) 500 MG TABLET    Take 500 mg by mouth daily.   VITAMIN D, ERGOCALCIFEROL, (DRISDOL) 1.25 MG (50000 UNIT) CAPS CAPSULE    Take 50,000 Units by mouth once a week.  Modified Medications   No medications on file  Discontinued Medications   No medications on file    Allergies: Allergies  Allergen Reactions   Other     Labs: Lab Results  Component Value Date   HIV1RNAQUANT Not Detected 11/04/2022   HIV1RNAQUANT Not Detected 08/26/2022   HIV1RNAQUANT Not Detected 06/24/2022    RPR and STI Lab Results  Component Value Date   LABRPR REACTIVE (A) 08/26/2022   LABRPR REACTIVE (A) 06/24/2022   LABRPR REACTIVE (A) 04/29/2022   LABRPR REACTIVE (A) 06/25/2021   LABRPR REACTIVE (A) 12/18/2020    RPRTITER 1:2 (H) 08/26/2022   RPRTITER 1:2 (H) 06/24/2022   RPRTITER 1:2 (H) 04/29/2022   RPRTITER 1:8 (H) 06/25/2021   RPRTITER 1:16 (H) 12/18/2020    STI Results GC GC CT CT  08/26/2022  9:14 AM Negative    Negative   Negative    Negative    06/24/2022 10:42 AM Negative    Negative   Negative    Negative    04/29/2022 10:00 AM Negative   Negative    10/31/2021  9:06 AM Negative   Negative    09/02/2021  9:56 AM Negative   Negative    06/25/2021  8:33 AM Negative   Negative    01/30/2021 10:14 AM Negative   Negative    10/16/2019  3:39 PM Negative   Negative    05/09/2013 10:57 AM  NEGATIVE   NEGATIVE   12/07/2011 12:20 PM   NEGATIVE      Hepatitis B Lab Results  Component Value Date   HEPBSAB REACTIVE (A) 12/31/2021   HEPBSAG NON-REACTIVE 12/18/2020   HEPBCAB NON-REACTIVE 12/18/2020   Hepatitis C Lab Results  Component Value Date   HEPCAB NON-REACTIVE 12/18/2020   Hepatitis A Lab Results  Component Value Date   HAV NON-REACTIVE 06/24/2022   Lipids: Lab Results  Component Value Date   CHOL 154 07/28/2019   TRIG 96 07/28/2019   HDL 57 07/28/2019   CHOLHDL 2.7 07/28/2019   LDLCALC 79 07/28/2019    TARGET DATE: The 3rd of the month  Assessment: Rebekkah presents today for their Apretude injection and to follow up for HIV PrEP. No issues with past injections. Denies any symptoms of acute HIV. Last STI screening was on 08/26/22 and was negative. No known exposures to any STIs since last visit. ***Agrees to full STI testing today with RPR and oral/urine/rectal cytologies.   Per [employer] guidelines, a rapid HIV test should be drawn prior to Apretude administration. Due to state shortage of rapid HIV tests, this is temporarily unable to be done. Per decision from [facility] physicians, we will proceed with Apretude administration at this time without a negative rapid HIV test beforehand. HIV RNA was collected today and is in process.  Administered cabotegravir 600mg /64mL  in *** upper outer quadrant of the gluteal muscle. Will see *** back in 2 months for injection, labs, and HIV PrEP follow up.  Plan: - Apretude injection administered - HIV RNA today - Next injection, labs, and PrEP follow up appointment scheduled for *** - Call with any issues or questions  Lora Paula, PharmD PGY-2 Infectious Diseases Pharmacy Resident  [facility] 12/28/2022 8:03 PM

## 2022-12-29 ENCOUNTER — Ambulatory Visit: Payer: MEDICAID | Admitting: Pharmacist

## 2022-12-30 ENCOUNTER — Other Ambulatory Visit: Payer: MEDICAID

## 2022-12-30 NOTE — Progress Notes (Unsigned)
HPI: Dominique [name] is a 47 y.o. female who presents to the [facility] pharmacy clinic for Apretude administration and HIV PrEP follow up.  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   CAPSAICIN (ZOSTRIX) 0.025 % CREAM    Apply 1 application. topically 3 (three) times daily.   CETIRIZINE (ZYRTEC) 10 MG TABLET    Take 10 mg by mouth daily.   CITALOPRAM (CELEXA) 20 MG TABLET    Take 1 tablet by mouth daily.   CLOPIDOGREL (PLAVIX) 75 MG TABLET    Take 0.5 tablets (37.5 mg total) by mouth daily.   DEPO-SUBQ PROVERA 104 104 MG/0.65ML INJECTION    every 3 (three) months.   DICLOFENAC SODIUM (VOLTAREN) 1 % GEL    Apply 2 g topically 4 (four) times daily.   FAMOTIDINE (PEPCID) 20 MG TABLET    Take 20 mg by mouth at bedtime.   FLUTICASONE (FLONASE) 50 MCG/ACT NASAL SPRAY    Place into the nose.    FOLIC ACID (FOLVITE) 1 MG TABLET    Take 1 tablet (1 mg total) by mouth daily.   FOLIC ACID (FOLVITE) 1 MG TABLET    Take 1 tablet by mouth daily.   GABAPENTIN (NEURONTIN) 600 MG TABLET    Take 600 mg by mouth 3 (three) times daily.   HYDROXYCHLOROQUINE (PLAQUENIL) 200 MG TABLET    TAKE 1 TABLET(200 MG) BY MOUTH DAILY   MEDROXYPROGESTERONE (DEPO-SUBQ PROVERA 104) 104 MG/0.65ML INJECTION    Inject into the skin.   MELOXICAM (MOBIC) 15 MG TABLET    Take 1 tablet (15 mg total) by mouth daily.   MISC. DEVICES (ROLLATOR ULTRA-LIGHT) MISC    Rollator/Rolling Walker with seat, use for balance and support with ambulation   NORETHINDRONE (MICRONOR) 0.35 MG TABLET    Take 1 tablet by mouth daily.   NORTRIPTYLINE (PAMELOR) 25 MG CAPSULE    TAKE 1 CAPSULE(25 MG) BY MOUTH AT BEDTIME   PREGABALIN (LYRICA) 100 MG CAPSULE    Take by mouth.   PREGABALIN (LYRICA) 100 MG CAPSULE    Take 100 mg by mouth 3 (three) times daily.   QUETIAPINE (SEROQUEL) 100 MG TABLET    Take 100 mg by mouth daily.   SULFASALAZINE (AZULFIDINE) 500 MG EC TABLET    Take 1 tablet (500 mg total) by mouth 2 (two) times daily.   TIZANIDINE (ZANAFLEX) 4  MG TABLET    Take 4 mg by mouth 2 (two) times daily.   VALACYCLOVIR (VALTREX) 500 MG TABLET    Take 500 mg by mouth daily.   VITAMIN D, ERGOCALCIFEROL, (DRISDOL) 1.25 MG (50000 UNIT) CAPS CAPSULE    Take 50,000 Units by mouth once a week.  Modified Medications   No medications on file  Discontinued Medications   No medications on file    Allergies: Allergies  Allergen Reactions   Other     Labs: Lab Results  Component Value Date   HIV1RNAQUANT Not Detected 11/04/2022   HIV1RNAQUANT Not Detected 08/26/2022   HIV1RNAQUANT Not Detected 06/24/2022    RPR and STI Lab Results  Component Value Date   LABRPR REACTIVE (A) 08/26/2022   LABRPR REACTIVE (A) 06/24/2022   LABRPR REACTIVE (A) 04/29/2022   LABRPR REACTIVE (A) 06/25/2021   LABRPR REACTIVE (A) 12/18/2020   RPRTITER 1:2 (H)  08/26/2022   RPRTITER 1:2 (H) 06/24/2022   RPRTITER 1:2 (H) 04/29/2022   RPRTITER 1:8 (H) 06/25/2021   RPRTITER 1:16 (H) 12/18/2020    STI Results GC GC CT CT  08/26/2022  9:14 AM Negative    Negative   Negative    Negative    06/24/2022 10:42 AM Negative    Negative   Negative    Negative    04/29/2022 10:00 AM Negative   Negative    10/31/2021  9:06 AM Negative   Negative    09/02/2021  9:56 AM Negative   Negative    06/25/2021  8:33 AM Negative   Negative    01/30/2021 10:14 AM Negative   Negative    10/16/2019  3:39 PM Negative   Negative    05/09/2013 10:57 AM  NEGATIVE   NEGATIVE   12/07/2011 12:20 PM   NEGATIVE      Hepatitis B Lab Results  Component Value Date   HEPBSAB REACTIVE (A) 12/31/2021   HEPBSAG NON-REACTIVE 12/18/2020   HEPBCAB NON-REACTIVE 12/18/2020   Hepatitis C Lab Results  Component Value Date   HEPCAB NON-REACTIVE 12/18/2020   Hepatitis A Lab Results  Component Value Date   HAV NON-REACTIVE 06/24/2022   Lipids: Lab Results  Component Value Date   CHOL 154 07/28/2019   TRIG 96 07/28/2019   HDL 57 07/28/2019   CHOLHDL 2.7 07/28/2019   LDLCALC 79 07/28/2019    TARGET DATE: The 3rd  Assessment: Dominique [name] presents today for her Apretude injection and to follow up for HIV PrEP. No issues with past injections. Denies any symptoms of acute HIV. Last STI screening was on 08/26/22 and was negative. No known exposures to any STIs since last visit and no new partners. Agrees to receive the annual flu vaccine but declines COVID today.  Per [employer] guidelines, a rapid HIV test should be drawn prior to Apretude administration. Due to state shortage of rapid HIV tests, this is temporarily unable to be done. Per decision from [facility] physicians, we will proceed with Apretude administration at this time without a negative rapid HIV test beforehand. HIV RNA was collected today and is in process.  Administered cabotegravir 600mg /47mL in left upper outer  quadrant of the gluteal muscle. Will see her back in 2 months for injection, labs, and HIV PrEP follow up.  Plan: - Apretude injection administered - HIV RNA today - Administered the annual flu vaccine today - Last Hep A vaccine due at December appointment - Next injection, labs, and PrEP follow up appointment scheduled for 03/03/23 -  Call with any issues or questions  Alveda Vanhorne L. Jataya Wann, PharmD, BCIDP, AAHIVP, CPP Clinical Pharmacist Practitioner Infectious Diseases Clinical Pharmacist [facility]

## 2022-12-31 ENCOUNTER — Ambulatory Visit: Payer: MEDICAID | Admitting: Pharmacist

## 2022-12-31 ENCOUNTER — Other Ambulatory Visit: Payer: Self-pay

## 2022-12-31 DIAGNOSIS — Z2981 Encounter for HIV pre-exposure prophylaxis: Secondary | ICD-10-CM | POA: Diagnosis not present

## 2022-12-31 DIAGNOSIS — Z23 Encounter for immunization: Secondary | ICD-10-CM | POA: Diagnosis not present

## 2022-12-31 DIAGNOSIS — Z79899 Other long term (current) drug therapy: Secondary | ICD-10-CM

## 2022-12-31 MED ORDER — CABOTEGRAVIR ER 600 MG/3ML IM SUER
600.0000 mg | Freq: Once | INTRAMUSCULAR | Status: AC
Start: 2022-12-31 — End: 2022-12-31
  Administered 2022-12-31: 600 mg via INTRAMUSCULAR

## 2023-01-02 LAB — HIV-1 RNA QUANT-NO REFLEX-BLD
HIV 1 RNA Quant: NOT DETECTED {copies}/mL
HIV-1 RNA Quant, Log: NOT DETECTED {Log}

## 2023-01-04 ENCOUNTER — Ambulatory Visit: Payer: MEDICAID | Attending: Pain Medicine | Admitting: Physical Therapy

## 2023-01-05 ENCOUNTER — Ambulatory Visit
Admission: RE | Admit: 2023-01-05 | Discharge: 2023-01-05 | Disposition: A | Discharge status: Home / Self Care | Payer: MEDICAID | Source: Ambulatory Visit | Attending: Internal Medicine | Admitting: Internal Medicine

## 2023-01-05 DIAGNOSIS — E0590 Thyrotoxicosis, unspecified without thyrotoxic crisis or storm: Secondary | ICD-10-CM

## 2023-01-11 ENCOUNTER — Telehealth: Payer: Self-pay

## 2023-01-11 DIAGNOSIS — E042 Nontoxic multinodular goiter: Secondary | ICD-10-CM

## 2023-01-11 NOTE — Telephone Encounter (Signed)
Patient would like to move forward with biopsy.

## 2023-01-19 NOTE — Progress Notes (Deleted)
Office Visit Note  Patient: Dominique [name]             Date of Birth: Aug 12, 1975           MRN: [id number]             PCP: Dot Been, FNP Referring: Dot Been, FNP Visit Date: 02/02/2023   Subjective:  No chief complaint on file.   History of Present Illness: Dominique [name] is a 47 y.o. 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    Previous HPI 11/02/2022 Dominique [name] is a 47 y.o. 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  Since her last visit she has not had any major exacerbation of symptoms.  Most problematic joint still in bilateral ankles mostly pain with weightbearing.  Does not see a lot of associated swelling occasionally some that last less than 1 day duration.  Still with discolored skin rashes mostly on the face not itchy or painful.  She is noticing more bruising on her lower legs does not recall any preceding trauma to the sites.  No bruising on the upper part of her body.  She is scheduled for follow-up 91-month MRI for monitoring of aneurysm but with no new symptom complaints.   Previous HPI 07/15/2022 Dominique [name] is a 47 y.o. 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  Since her last visit she saw physical therapy completed 1 session with them so far is scheduled to follow-up planning to do some pool or other aquatic therapy.  She switched to Lyrica with pain management so far finding this more effective than the gabapentin for ongoing symptoms.  She had mildly low potassium checked in previous clinic visit was concerned if this contributed to muscle cramping issues but was improved back to normal range upon recheck.  Still getting some stiffness and soreness most noticeably at the feet and ankles and some lasting through the  day but no major exacerbation.  Not seeing much visible swelling or rashes.   Previous HPI 04/15/22 Dominique [name] is a 47 y.o. female here for follow up for seronegative RA (ANA+) on hydroxychloroquine 200 mg daily and sulfasalazine 1000 mg twice.  No major flareups since her last visit.  Hyperpigmented skin changes mostly around the face may be the same or slightly worse.  Still with persistent foot and ankle pain.   Previous HPI 12/10/21 Dominique [name] is a 47 y.o. 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  Most symptoms are stable main complaint today is bilateral foot and ankle pain and swelling. No obvious difference off of HCQ. Pain is throughout the day describes as stabbing with pins type of sensation. She is seeing swelling accumulate usually by the evening and worse when sitting or standing in fixed position for a long time.    Previous HPI 09/12/2021 Dominique [name] is a 47 y.o. 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 She feels symptoms partially improved with less knee pain but continues to have every day pain in bilateral feet and ankles. Pain is first thing in the morning as well as worsening if she is on her  feet all day. Feet swelling is noticeable with prolonged standing worse later in the day. She had coil embolization procedure uneventfully. She has noticed some progression of facial rash.   Previous HPI 07/21/21 Dominique [name] is a 47 y.o. 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 So far she still has about the same amount of pain worst affected areas in both heels and in mid feet. Plantar fasciitis exercises mildly helpful for the bottom of the feet but not much. 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    Previous HPI 05/20/21 Dominique [name] is a 47 y.o. 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 So far her symptoms are about the same. She continues having pain worst in her feet and less extent along medial side of knees. She does not see much swelling or discoloration just pain. Has not noticed any side effects of the methotrexate.   Previous HPI 01/23/21 Dominique [name] is a 47 y.o. female here for evaluation of positive ANA and elevated sedimentation rate with multiple systemic symptoms including weight loss, lymphadenopathy, paresthesias, joint pain, and headaches.  She started noticing hyperpigmented skin rashes developing on her face and extremities since several months ago does not recall specific onset or provoking episode.  Then developed worsening joint pain in multiple sites but started around July of this year.  This involved pain affecting both hands also the hips knees and feet bilaterally.  She described morning stiffness lasting 2 or 3 hours in duration also especially pain in the bottoms of the feet first and getting out of bed.  After a month with these ongoing and somewhat worsening symptoms she was seen in primary care clinic. Workup at that time positive for trichomoniasis and syphilis and starting antibiotics also found to have high sedimentation rate with positive ANA and CCP Abs. She followed up for continued treatment last month and more recently in ID clinic with completion of antibiotic treatment for syphilis with Bicillin.  Neurology evaluation with lumbar puncture that was not suggestive for active neurosyphilis. She saw ophthalmology for visual change symptoms reports findings were not concerning for infectious or inflammatory type changes.   No Rheumatology ROS completed.   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   Right hip pain 05/16/2020   BRBPR (bright red blood per rectum) 10/18/2019   Tobacco use disorder 07/31/2019   Dyspnea, paroxysmal nocturnal 07/31/2019   Schizophrenia (HCC) 07/31/2019   Encounter for screening examination for sexually transmitted disease 07/31/2019   Abnormal uterine bleeding (AUB) 05/09/2013   BV (bacterial vaginosis) 05/09/2013   Dysmenorrhea 05/09/2013    Past Medical History:  Diagnosis Date   Aneurysm (HCC)    Arthritis    RA   Kidney stones    Lupus (HCC) 01/17/2021   Lupus (  HCC)    Schizophrenia (HCC)    Systemic lupus erythematosus (HCC)     Family History  Problem Relation Age of Onset   Stroke Mother    Multiple sclerosis Father    Hypertension Father    Diabetes Father    Breast cancer Maternal Aunt        unknown age   Dementia Maternal Grandfather    Asthma Brother    Past Surgical History:  Procedure Laterality Date   BREAST BIOPSY Right 09/29/2022   [place] RT BREAST BX W LOC DEV 1ST LESION IMG BX SPEC US GUIDE 09/29/2022 [facility]   CESAREAN SECTION     HERNIA REPAIR     IR 3D INDEPENDENT WKST  07/28/2021   IR ANGIO INTRA EXTRACRAN SEL INTERNAL CAROTID BILAT MOD SED  07/28/2021   IR ANGIO VERTEBRAL SEL VERTEBRAL UNI L MOD SED  07/28/2021   IR ANGIOGRAM FOLLOW UP STUDY  07/28/2021   IR CT HEAD LTD  07/28/2021   IR NEURO EACH ADD'L AFTER BASIC UNI LEFT (MS)  07/28/2021   IR RADIOLOGIST EVAL & MGMT  07/10/2021   IR RADIOLOGIST EVAL & MGMT  08/15/2021   IR TRANSCATH/EMBOLIZ  07/28/2021   IR US GUIDE VASC ACCESS RIGHT  07/28/2021   RADIOLOGY WITH ANESTHESIA N/A  07/28/2021   Procedure: EMBOLIZATION;  Surgeon: Julieanne Cotton, MD;  Location: [facility] OR;  Service: Radiology;  Laterality: N/A;   Social History   Social History Narrative   Right handed   Immunization History  Administered Date(s) Administered   Hepatitis A, Adult 08/26/2022   Hepb-cpg 06/25/2021, 09/02/2021   Influenza, Seasonal, Injecte, Preservative Fre 12/31/2022   Influenza,inj,Quad PF,6+ Mos 01/30/2021, 12/31/2021   Tdap 02/25/2022     Objective: Vital Signs: There were no vitals taken for this visit.   Physical Exam   Musculoskeletal Exam: ***  CDAI Exam: CDAI Score: -- Patient Global: --; Provider Global: -- Swollen: --; Tender: -- Joint Exam 02/02/2023   No joint exam has been documented for this visit   There is currently no information documented on the homunculus. Go to the Rheumatology activity and complete the homunculus joint exam.  Investigation: No additional findings.  Imaging: US THYROID  Result Date: 01/07/2023 CLINICAL DATA:  Patient was diagnosed subclinical hyperthyroidism, has noted recent local neck swelling with tenderness on exam today to the left EXAM: THYROID ULTRASOUND TECHNIQUE: Ultrasound examination of the thyroid gland and adjacent soft tissues was performed. COMPARISON:  None Available. FINDINGS: Parenchymal Echotexture: Mildly heterogenous Isthmus: 1.1 cm Right lobe: 5.6 x 2.4 x 2.6 cm Left lobe: 6.2 x 3.2 x 3.0 cm _________________________________________________________ Estimated total number of nodules >/= 1 cm: 2 Number of spongiform nodules >/=  2 cm not described below (TR1): 0 Number of mixed cystic and solid nodules >/= 1.5 cm not described below (TR2): 0 _________________________________________________________ Nodule labeled 1 is a solid hypoechoic TR 4 nodule in the thyroid isthmus measuring 1.1 x 0.9 x 0.7 cm. *Given size (>/= 1 - 1.4 cm) and appearance, a follow-up ultrasound in 1 year should be considered based on TI-RADS  criteria. Nodule labeled 2 is a solid isoechoic TR 3 nodule in the thyroid isthmus measuring less than 1 cm in size. Given size (<1.4 cm) and appearance, this nodule does NOT meet TI-RADS criteria for biopsy or dedicated follow-up. Nodule labeled 3 is a predominantly solid isoechoic TR 3 nodule in the inferior left thyroid lobe measuring 3.8 x 2.5 x 2.2 cm. **Given size (>/= 2.5  cm) and appearance, fine needle aspiration of this mildly suspicious nodule should be considered based on TI-RADS criteria. IMPRESSION: 1. Enlarged, multinodular thyroid gland. 2. Nodule labeled 3 in the inferior left thyroid lobe (3.8 cm TR 3) meets criteria for biopsy. 3. Nodule labeled 1 in the thyroid isthmus (1.1 cm TR 4) meets criteria for follow-up ultrasound in 1 year. A total follow-up interval of 5 years is recommended, with this exam serving as a baseline. The above is in keeping with the ACR TI-RADS recommendations - J Am Coll Radiol 2017;14:587-595. Electronically Signed   By: Olive Bass M.D.   On: 01/07/2023 16:30    Recent Labs: Lab Results  Component Value Date   WBC 7.4 12/23/2022   HGB 10.2 (L) 12/23/2022   PLT 175.0 12/23/2022   NA 139 12/23/2022   K 3.7 12/23/2022   CL 109 12/23/2022   CO2 24 12/23/2022   GLUCOSE 90 12/23/2022   BUN 9 12/23/2022   CREATININE 0.68 12/23/2022   BILITOT 0.4 12/23/2022   ALKPHOS 58 12/23/2022   AST 17 12/23/2022   ALT 13 12/23/2022   PROT 6.5 12/23/2022   ALBUMIN 4.0 12/23/2022   CALCIUM 8.9 12/23/2022   GFRAA 117 05/20/2020    Speciality Comments: Dr. Laruth Bouchard office to fax PLQ eye exam 08/2022  Procedures:  No procedures performed Allergies: Other   Assessment / Plan:     Visit Diagnoses: No diagnosis found.  ***  Orders: No orders of the defined types were placed in this encounter.  No orders of the defined types were placed in this encounter.    Follow-Up Instructions: No follow-ups on file.   Metta Clines, RT  Note - This record has  been created using [employer].  Chart creation errors have been sought, but may not always  have been located. Such creation errors do not reflect on  the standard of medical care.

## 2023-01-21 ENCOUNTER — Telehealth (HOSPITAL_COMMUNITY): Payer: Self-pay

## 2023-01-21 NOTE — Telephone Encounter (Signed)
Pt agreed to f/u in 6 months with an MRA. AB

## 2023-01-22 ENCOUNTER — Inpatient Hospital Stay: Admission: RE | Admit: 2023-01-22 | Payer: MEDICAID | Source: Ambulatory Visit

## 2023-02-02 ENCOUNTER — Ambulatory Visit: Payer: MEDICAID | Admitting: Internal Medicine

## 2023-02-02 DIAGNOSIS — Z79899 Other long term (current) drug therapy: Secondary | ICD-10-CM

## 2023-02-02 DIAGNOSIS — M0600 Rheumatoid arthritis without rheumatoid factor, unspecified site: Secondary | ICD-10-CM

## 2023-02-02 DIAGNOSIS — I671 Cerebral aneurysm, nonruptured: Secondary | ICD-10-CM

## 2023-02-04 ENCOUNTER — Other Ambulatory Visit (HOSPITAL_COMMUNITY)
Admission: RE | Admit: 2023-02-04 | Discharge: 2023-02-04 | Disposition: A | Discharge status: Home / Self Care | Payer: MEDICAID | Source: Ambulatory Visit | Attending: Interventional Radiology | Admitting: Interventional Radiology

## 2023-02-04 ENCOUNTER — Other Ambulatory Visit: Payer: Self-pay

## 2023-02-04 ENCOUNTER — Ambulatory Visit
Admission: RE | Admit: 2023-02-04 | Discharge: 2023-02-04 | Disposition: A | Discharge status: Home / Self Care | Payer: MEDICAID | Source: Ambulatory Visit | Attending: Internal Medicine | Admitting: Internal Medicine

## 2023-02-04 DIAGNOSIS — E042 Nontoxic multinodular goiter: Secondary | ICD-10-CM | POA: Diagnosis present

## 2023-02-08 ENCOUNTER — Other Ambulatory Visit: Payer: Self-pay

## 2023-02-08 ENCOUNTER — Other Ambulatory Visit (HOSPITAL_COMMUNITY): Payer: Self-pay

## 2023-02-08 LAB — CYTOLOGY - NON PAP

## 2023-02-08 NOTE — Progress Notes (Signed)
Specialty Pharmacy Refill Coordination Note  Dominique [name] is a 47 y.o. female assessed today regarding refills of clinic administered specialty medication(s) [facility]   Clinic requested Courier to Provider Office   Delivery date: 02/22/23   Verified address: [facility] [address] [place] [address]   Medication will be filled on 02/19/23.

## 2023-02-22 ENCOUNTER — Telehealth: Payer: Self-pay

## 2023-02-22 NOTE — Telephone Encounter (Signed)
[facility] Patient Advocate Encounter  Patient's medications Apretude have been couriered to [facility] from [facility] and will be administered at the patients appointment on 03/03/23.  Clearance Coots, CPhT Specialty Pharmacy Patient [facility] for Infectious Disease Phone: [phone] Fax:  [phone]

## 2023-02-24 ENCOUNTER — Other Ambulatory Visit: Payer: Self-pay | Admitting: Internal Medicine

## 2023-02-24 NOTE — Telephone Encounter (Signed)
Last Fill: 11/20/2022  Labs: 12/23/2022  RBC 3.15 Hemoglobin 10.2 HCT 31.2  Next Visit: 04/06/2023  Last Visit: 11/02/2022  DX: Seronegative rheumatoid arthritis   Current Dose per office note 11/02/2022: sulfasalazine 1000 mg twice daily.   Dose decreased per lab results on 11/02/2022: I recommend decreasing the dose by half for now and we could recheck her labs (Hgb, LFTs) after 1 month of reducing this.   Please change prescription if needed.   Okay to refill Sulfasalazine?

## 2023-03-02 NOTE — Progress Notes (Unsigned)
HPI: Dominique [name] is a 47 y.o. female who presents to the [facility] pharmacy clinic for Apretude administration and HIV PrEP follow up.  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   CAPSAICIN (ZOSTRIX) 0.025 % CREAM    Apply 1 application. topically 3 (three) times daily.   CETIRIZINE (ZYRTEC) 10 MG TABLET    Take 10 mg by mouth daily.   CITALOPRAM (CELEXA) 20 MG TABLET    Take 1 tablet by mouth daily.   CLOPIDOGREL (PLAVIX) 75 MG TABLET    Take 0.5 tablets (37.5 mg total) by mouth daily.   DEPO-SUBQ PROVERA 104 104 MG/0.65ML INJECTION    every 3 (three) months.   DICLOFENAC SODIUM (VOLTAREN) 1 % GEL    Apply 2 g topically 4 (four) times daily.   FAMOTIDINE (PEPCID) 20 MG TABLET    Take 20 mg by mouth at bedtime.   FLUTICASONE (FLONASE) 50 MCG/ACT NASAL SPRAY    Place into the nose.    FOLIC ACID (FOLVITE) 1 MG TABLET    Take 1 tablet (1 mg total) by mouth daily.   FOLIC ACID (FOLVITE) 1 MG TABLET    Take 1 tablet by mouth daily.   GABAPENTIN (NEURONTIN) 600 MG TABLET    Take 600 mg by mouth 3 (three) times daily.   HYDROXYCHLOROQUINE (PLAQUENIL) 200 MG TABLET    TAKE 1 TABLET(200 MG) BY MOUTH DAILY   MEDROXYPROGESTERONE (DEPO-SUBQ PROVERA 104) 104 MG/0.65ML INJECTION    Inject into the skin.   MELOXICAM (MOBIC) 15 MG TABLET    Take 1 tablet (15 mg total) by mouth daily.   MISC. DEVICES (ROLLATOR ULTRA-LIGHT) MISC    Rollator/Rolling Walker with seat, use for balance and support with ambulation   NORETHINDRONE (MICRONOR) 0.35 MG TABLET    Take 1 tablet by mouth daily.   NORTRIPTYLINE (PAMELOR) 25 MG CAPSULE    TAKE 1 CAPSULE(25 MG) BY MOUTH AT BEDTIME   PREGABALIN (LYRICA) 100 MG CAPSULE    Take by mouth.   PREGABALIN (LYRICA) 100 MG CAPSULE    Take 100 mg by mouth 3 (three) times daily.   QUETIAPINE (SEROQUEL) 100 MG TABLET    Take 100 mg by mouth daily.   SULFASALAZINE (AZULFIDINE) 500 MG EC TABLET    Take 1 tablet (500 mg total) by mouth 2 (two) times daily.   TIZANIDINE (ZANAFLEX) 4  MG TABLET    Take 4 mg by mouth 2 (two) times daily.   VALACYCLOVIR (VALTREX) 500 MG TABLET    Take 500 mg by mouth daily.   VITAMIN D, ERGOCALCIFEROL, (DRISDOL) 1.25 MG (50000 UNIT) CAPS CAPSULE    Take 50,000 Units by mouth once a week.  Modified Medications   No medications on file  Discontinued Medications   No medications on file    Allergies: Allergies  Allergen Reactions   Other     Labs: Lab Results  Component Value Date   HIV1RNAQUANT Not Detected 12/31/2022   HIV1RNAQUANT Not Detected 11/04/2022   HIV1RNAQUANT Not Detected 08/26/2022    RPR and STI Lab Results  Component Value Date   LABRPR REACTIVE (A) 08/26/2022   LABRPR REACTIVE (A) 06/24/2022   LABRPR REACTIVE (A) 04/29/2022   LABRPR REACTIVE (A) 06/25/2021   LABRPR REACTIVE (A) 12/18/2020   RPRTITER 1:2 (H)  08/26/2022   RPRTITER 1:2 (H) 06/24/2022   RPRTITER 1:2 (H) 04/29/2022   RPRTITER 1:8 (H) 06/25/2021   RPRTITER 1:16 (H) 12/18/2020    STI Results GC GC CT CT  08/26/2022  9:14 AM Negative    Negative   Negative    Negative    06/24/2022 10:42 AM Negative    Negative   Negative    Negative    04/29/2022 10:00 AM Negative   Negative    10/31/2021  9:06 AM Negative   Negative    09/02/2021  9:56 AM Negative   Negative    06/25/2021  8:33 AM Negative   Negative    01/30/2021 10:14 AM Negative   Negative    10/16/2019  3:39 PM Negative   Negative    05/09/2013 10:57 AM  NEGATIVE   NEGATIVE   12/07/2011 12:20 PM   NEGATIVE      Hepatitis B Lab Results  Component Value Date   HEPBSAB REACTIVE (A) 12/31/2021   HEPBSAG NON-REACTIVE 12/18/2020   HEPBCAB NON-REACTIVE 12/18/2020   Hepatitis C Lab Results  Component Value Date   HEPCAB NON-REACTIVE 12/18/2020   Hepatitis A Lab Results  Component Value Date   HAV NON-REACTIVE 06/24/2022   Lipids: Lab Results  Component Value Date   CHOL 154 07/28/2019   TRIG 96 07/28/2019   HDL 57 07/28/2019   CHOLHDL 2.7 07/28/2019   LDLCALC 79 07/28/2019    TARGET DATE: The 3rd  Assessment: Dominique [name] presents today for her Apretude injection and to follow up for HIV PrEP. No issues with past injections. Denies any symptoms of acute HIV. Last STI screening was on 08/26/22 and was negative. No known exposures to any STIs since last visit. Agrees to full STI testing today with RPR and oral/urine cytologies. Due for 2nd Hepatitis A vaccine today.  Per [employer] guidelines, a rapid HIV test should be drawn prior to Apretude administration. Due to state shortage of rapid HIV tests, this is temporarily unable to be done. Per decision from [facility] physicians, we will proceed with Apretude administration at this time without a negative rapid HIV test beforehand. HIV RNA was collected today and is in process.  Administered cabotegravir 600mg /56mL in  left upper outer quadrant of the gluteal muscle. Will see her back in 2 months for injection, labs, and HIV PrEP follow up.  Plan: - Apretude injection administered - HIV RNA, RPR, and oral/urine cytologies today - 2nd and final Hep A vaccine today - Next injection, labs, and PrEP follow up appointment scheduled for 05/04/23 - Call  with any issues or questions  Wakisha Alberts L. Florabel Faulks, PharmD, BCIDP, AAHIVP, CPP Clinical Pharmacist Practitioner Infectious Diseases Clinical Pharmacist [facility]

## 2023-03-03 ENCOUNTER — Other Ambulatory Visit (HOSPITAL_COMMUNITY)
Admission: RE | Admit: 2023-03-03 | Discharge: 2023-03-03 | Disposition: A | Discharge status: Home / Self Care | Payer: MEDICAID | Source: Ambulatory Visit | Attending: Infectious Disease | Admitting: Infectious Disease

## 2023-03-03 ENCOUNTER — Ambulatory Visit (INDEPENDENT_AMBULATORY_CARE_PROVIDER_SITE_OTHER): Payer: MEDICAID | Admitting: Pharmacist

## 2023-03-03 ENCOUNTER — Other Ambulatory Visit: Payer: Self-pay

## 2023-03-03 DIAGNOSIS — Z113 Encounter for screening for infections with a predominantly sexual mode of transmission: Secondary | ICD-10-CM | POA: Diagnosis present

## 2023-03-03 DIAGNOSIS — Z79899 Other long term (current) drug therapy: Secondary | ICD-10-CM

## 2023-03-03 DIAGNOSIS — Z23 Encounter for immunization: Secondary | ICD-10-CM | POA: Diagnosis not present

## 2023-03-03 DIAGNOSIS — Z2981 Encounter for HIV pre-exposure prophylaxis: Secondary | ICD-10-CM

## 2023-03-03 MED ORDER — CABOTEGRAVIR ER 600 MG/3ML IM SUER
600.0000 mg | Freq: Once | INTRAMUSCULAR | Status: AC
  Administered 2023-03-03: 600 mg via INTRAMUSCULAR

## 2023-03-05 LAB — T PALLIDUM AB: T Pallidum Abs: POSITIVE — AB

## 2023-03-05 LAB — URINE CYTOLOGY ANCILLARY ONLY
Chlamydia: NEGATIVE
Comment: NEGATIVE
Comment: NORMAL
Neisseria Gonorrhea: NEGATIVE

## 2023-03-05 LAB — CYTOLOGY, (ORAL, ANAL, URETHRAL) ANCILLARY ONLY
Chlamydia: NEGATIVE
Comment: NEGATIVE
Comment: NORMAL
Neisseria Gonorrhea: NEGATIVE

## 2023-03-05 LAB — RPR: RPR Ser Ql: REACTIVE — AB

## 2023-03-05 LAB — RPR TITER: RPR Titer: 1:1 {titer} — ABNORMAL HIGH

## 2023-03-05 LAB — HIV-1 RNA QUANT-NO REFLEX-BLD
HIV 1 RNA Quant: NOT DETECTED {copies}/mL
HIV-1 RNA Quant, Log: NOT DETECTED {Log_copies}/mL

## 2023-03-25 NOTE — Progress Notes (Deleted)
 Office Visit Note  Patient: Dominique [name]             Date of Birth: 21-Sep-1975           MRN: [id number]             PCP: Dot Been, FNP Referring: Dot Been, FNP Visit Date: 04/06/2023   Subjective:  No chief complaint on file.   History of Present Illness: Dominique [name] is a 47 y.o. 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    Previous HPI 11/02/2022 Dominique [name] is a 47 y.o. 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  Since her last visit she has not had any major exacerbation of symptoms.  Most problematic joint still in bilateral ankles mostly pain with weightbearing.  Does not see a lot of associated swelling occasionally some that last less than 1 day duration.  Still with discolored skin rashes mostly on the face not itchy or painful.  She is noticing more bruising on her lower legs does not recall any preceding trauma to the sites.  No bruising on the upper part of her body.  She is scheduled for follow-up 48-month MRI for monitoring of aneurysm but with no new symptom complaints.   Previous HPI 07/15/2022 Dominique [name] is a 47 y.o. 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  Since her last visit she saw physical therapy completed 1 session with them so far is scheduled to follow-up planning to do some pool or other aquatic therapy.  She switched to Lyrica with pain management so far finding this more effective than the gabapentin for ongoing symptoms.  She had mildly low potassium checked in previous clinic visit was concerned if this contributed to muscle cramping issues but was improved back to normal range upon recheck.  Still getting some stiffness and soreness most noticeably at the feet and ankles and some lasting through the  day but no major exacerbation.  Not seeing much visible swelling or rashes.   Previous HPI 04/15/22 Dominique [name] is a 47 y.o. female here for follow up for seronegative RA (ANA+) on hydroxychloroquine 200 mg daily and sulfasalazine 1000 mg twice.  No major flareups since her last visit.  Hyperpigmented skin changes mostly around the face may be the same or slightly worse.  Still with persistent foot and ankle pain.   Previous HPI 12/10/21 Dominique [name] is a 47 y.o. 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  Most symptoms are stable main complaint today is bilateral foot and ankle pain and swelling. No obvious difference off of HCQ. Pain is throughout the day describes as stabbing with pins type of sensation. She is seeing swelling accumulate usually by the evening and worse when sitting or standing in fixed position for a long time.    Previous HPI 09/12/2021 Dominique [name] is a 47 y.o. 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 She feels symptoms partially improved with less knee pain but continues to have every day pain in bilateral feet and ankles. Pain is first thing in the morning as well as worsening if she is on her  feet all day. Feet swelling is noticeable with prolonged standing worse later in the day. She had coil embolization procedure uneventfully. She has noticed some progression of facial rash.   Previous HPI 07/21/21 Dominique [name] is a 47 y.o. 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 So far she still has about the same amount of pain worst affected areas in both heels and in mid feet. Plantar fasciitis exercises mildly helpful for the bottom of the feet but not much. 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    Previous HPI 05/20/21 Dominique [name] is a 47 y.o. 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 So far her symptoms are about the same. She continues having pain worst in her feet and less extent along medial side of knees. She does not see much swelling or discoloration just pain. Has not noticed any side effects of the methotrexate.   Previous HPI 01/23/21 Dominique [name] is a 47 y.o. female here for evaluation of positive ANA and elevated sedimentation rate with multiple systemic symptoms including weight loss, lymphadenopathy, paresthesias, joint pain, and headaches.  She started noticing hyperpigmented skin rashes developing on her face and extremities since several months ago does not recall specific onset or provoking episode.  Then developed worsening joint pain in multiple sites but started around July of this year.  This involved pain affecting both hands also the hips knees and feet bilaterally.  She described morning stiffness lasting 2 or 3 hours in duration also especially pain in the bottoms of the feet first and getting out of bed.  After a month with these ongoing and somewhat worsening symptoms she was seen in primary care clinic. Workup at that time positive for trichomoniasis and syphilis and starting antibiotics also found to have high sedimentation rate with positive ANA and CCP Abs. She followed up for continued treatment last month and more recently in ID clinic with completion of antibiotic treatment for syphilis with Bicillin.  Neurology evaluation with lumbar puncture that was not suggestive for active neurosyphilis. She saw ophthalmology for visual change symptoms reports findings were not concerning for infectious or inflammatory type changes.   No Rheumatology ROS completed.   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   Right hip pain 05/16/2020   BRBPR (bright red blood per rectum) 10/18/2019   Tobacco use disorder 07/31/2019   Dyspnea, paroxysmal nocturnal 07/31/2019   Schizophrenia (HCC) 07/31/2019   Encounter for screening examination for sexually transmitted disease 07/31/2019   Abnormal uterine bleeding (AUB) 05/09/2013   BV (bacterial vaginosis) 05/09/2013   Dysmenorrhea 05/09/2013    Past Medical History:  Diagnosis Date   Aneurysm (HCC)    Arthritis    RA   Kidney stones    Lupus (HCC) 01/17/2021   Lupus (  HCC)    Schizophrenia (HCC)    Systemic lupus erythematosus (HCC)     Family History  Problem Relation Age of Onset   Stroke Mother    Multiple sclerosis Father    Hypertension Father    Diabetes Father    Breast cancer Maternal Aunt        unknown age   Dementia Maternal Grandfather    Asthma Brother    Past Surgical History:  Procedure Laterality Date   BREAST BIOPSY Right 09/29/2022   [place] RT BREAST BX W LOC DEV 1ST LESION IMG BX SPEC US GUIDE 09/29/2022 [facility]   CESAREAN SECTION     HERNIA REPAIR     IR 3D INDEPENDENT WKST  07/28/2021   IR ANGIO INTRA EXTRACRAN SEL INTERNAL CAROTID BILAT MOD SED  07/28/2021   IR ANGIO VERTEBRAL SEL VERTEBRAL UNI L MOD SED  07/28/2021   IR ANGIOGRAM FOLLOW UP STUDY  07/28/2021   IR CT HEAD LTD  07/28/2021   IR NEURO EACH ADD'L AFTER BASIC UNI LEFT (MS)  07/28/2021   IR RADIOLOGIST EVAL & MGMT  07/10/2021   IR RADIOLOGIST EVAL & MGMT  08/15/2021   IR TRANSCATH/EMBOLIZ  07/28/2021   IR US GUIDE VASC ACCESS RIGHT  07/28/2021   RADIOLOGY WITH ANESTHESIA N/A  07/28/2021   Procedure: EMBOLIZATION;  Surgeon: Julieanne Cotton, MD;  Location: [facility] OR;  Service: Radiology;  Laterality: N/A;   Social History   Social History Narrative   Right handed   Immunization History  Administered Date(s) Administered   Hepatitis A, Adult 08/26/2022, 03/03/2023   Hepb-cpg 06/25/2021, 09/02/2021   Influenza, Seasonal, Injecte, Preservative Fre 12/31/2022   Influenza,inj,Quad PF,6+ Mos 01/30/2021, 12/31/2021   Tdap 02/25/2022     Objective: Vital Signs: There were no vitals taken for this visit.   Physical Exam   Musculoskeletal Exam: ***  CDAI Exam: CDAI Score: -- Patient Global: --; Provider Global: -- Swollen: --; Tender: -- Joint Exam 04/06/2023   No joint exam has been documented for this visit   There is currently no information documented on the homunculus. Go to the Rheumatology activity and complete the homunculus joint exam.  Investigation: No additional findings.  Imaging: No results found.  Recent Labs: Lab Results  Component Value Date   WBC 7.4 12/23/2022   HGB 10.2 (L) 12/23/2022   PLT 175.0 12/23/2022   NA 139 12/23/2022   K 3.7 12/23/2022   CL 109 12/23/2022   CO2 24 12/23/2022   GLUCOSE 90 12/23/2022   BUN 9 12/23/2022   CREATININE 0.68 12/23/2022   BILITOT 0.4 12/23/2022   ALKPHOS 58 12/23/2022   AST 17 12/23/2022   ALT 13 12/23/2022   PROT 6.5 12/23/2022   ALBUMIN 4.0 12/23/2022   CALCIUM 8.9 12/23/2022   GFRAA 117 05/20/2020    Speciality Comments: Dr. Laruth Bouchard office to fax PLQ eye exam 08/2022  Procedures:  No procedures performed Allergies: Other   Assessment / Plan:     Visit Diagnoses: No diagnosis found.  ***  Orders: No orders of the defined types were placed in this encounter.  No orders of the defined types were placed in this encounter.    Follow-Up Instructions: No follow-ups on file.   Metta Clines, RT  Note - This record has been created using [employer].  Chart  creation errors have been sought, but may not always  have been located. Such creation errors do not reflect on  the standard of medical care.

## 2023-04-06 ENCOUNTER — Ambulatory Visit: Payer: MEDICAID | Admitting: Internal Medicine

## 2023-04-06 DIAGNOSIS — I671 Cerebral aneurysm, nonruptured: Secondary | ICD-10-CM

## 2023-04-06 DIAGNOSIS — Z79899 Other long term (current) drug therapy: Secondary | ICD-10-CM

## 2023-04-06 DIAGNOSIS — M0600 Rheumatoid arthritis without rheumatoid factor, unspecified site: Secondary | ICD-10-CM

## 2023-04-07 ENCOUNTER — Ambulatory Visit: Payer: Self-pay | Admitting: Internal Medicine

## 2023-04-07 NOTE — Progress Notes (Deleted)
 Office Visit Note  Patient: Dominique [name]             Date of Birth: 09-19-1975           MRN: [id number]             PCP: Dominique Johnston PARAS, FNP Referring: Dominique Johnston PARAS, FNP Visit Date: 04/07/2023   Subjective:  No chief complaint on file.   History of Present Illness: Dominique [name] is a 48 y.o. female here for follow up ***   Previous HPI 11/02/22  Dominique [name] is a 48 y.o. 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  Since her last visit she has not had any major exacerbation of symptoms.  Most problematic joint still in bilateral ankles mostly pain with weightbearing.  Does not see a lot of associated swelling occasionally some that last less than 1 day duration.  Still with discolored skin rashes mostly on the face not itchy or painful.  She is noticing more bruising on her lower legs does not recall any preceding trauma to the sites.  No bruising on the upper part of her body.  She is scheduled for follow-up 38-month MRI for monitoring of aneurysm but with no new symptom complaints.   Previous HPI 07/15/2022 Dominique [name] is a 48 y.o. 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  Since her last visit she saw physical therapy completed 1 session with them so far is scheduled to follow-up planning to do some pool or other aquatic therapy.  She switched to Lyrica with pain management so far finding this more effective than the gabapentin  for ongoing symptoms.  She had mildly low potassium checked in previous clinic visit was concerned if this contributed to muscle cramping issues but was improved back to normal range upon recheck.  Still getting some stiffness and soreness most noticeably at the feet and ankles and some lasting through the day but no major exacerbation.  Not seeing much visible swelling or rashes.   Previous  HPI 04/15/22 Dominique [name] is a 48 y.o. female here for follow up for seronegative RA (ANA+) on hydroxychloroquine  200 mg daily and sulfasalazine  1000 mg twice.  No major flareups since her last visit.  Hyperpigmented skin changes mostly around the face may be the same or slightly worse.  Still with persistent foot and ankle pain.   Previous HPI 12/10/21 Dominique [name] is a 48 y.o. 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  Most symptoms are stable main complaint today is bilateral foot and ankle pain and swelling. No obvious difference off of HCQ. Pain is throughout the day describes as stabbing with pins type of sensation. She is seeing swelling accumulate usually by the evening and worse when sitting or standing in fixed position for a long time.    Previous HPI 09/12/2021 Dominique [name] is a 48 y.o. 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 She feels symptoms partially improved with less knee pain but continues to have every day pain in bilateral feet and ankles. Pain is first thing in the morning as well as worsening if she is on her feet all day. Feet swelling is noticeable with prolonged standing worse later in  the day. She had coil embolization procedure uneventfully. She has noticed some progression of facial rash.   Previous HPI 07/21/21 Dominique [name] is a 48 y.o. 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 So far she still has about the same amount of pain worst affected areas in both heels and in mid feet. Plantar fasciitis exercises mildly helpful for the bottom of the feet but not much. 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    Previous HPI 05/20/21 Dominique [name] is a 48 y.o. 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 So far her symptoms are about the same. She continues having pain worst in her feet and less extent along medial side of knees. She does not see much swelling or discoloration just pain. Has not noticed any side effects of the methotrexate .   Previous HPI 01/23/21 Dominique [name] is a 48 y.o. female here for evaluation of positive ANA and elevated sedimentation rate with multiple systemic symptoms including weight loss, lymphadenopathy, paresthesias, joint pain, and headaches.  She started noticing hyperpigmented skin rashes developing on her face and extremities since several months ago does not recall specific onset or provoking episode.  Then developed worsening joint pain in multiple sites but started around July of this year.  This involved pain affecting both hands also the hips knees and feet bilaterally.  She described morning stiffness lasting 2 or 3 hours in duration also especially pain in the bottoms of the feet first and getting out of bed.  After a month with these ongoing and somewhat worsening symptoms she was seen in primary care clinic. Workup at that time positive for trichomoniasis and syphilis and starting antibiotics also found to have high sedimentation rate with positive ANA and CCP Abs. She followed up for continued treatment last month and more recently in ID clinic with completion of antibiotic treatment for syphilis with Bicillin .  Neurology evaluation with lumbar puncture that was not suggestive for active neurosyphilis. She saw ophthalmology for visual change symptoms reports findings were not concerning for infectious or inflammatory type changes.   No Rheumatology ROS completed.   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   Right hip pain 05/16/2020   BRBPR (bright red blood per rectum) 10/18/2019   Tobacco use disorder 07/31/2019   Dyspnea, paroxysmal nocturnal 07/31/2019   Schizophrenia (HCC) 07/31/2019   Encounter for screening examination for sexually transmitted disease 07/31/2019   Abnormal uterine bleeding (AUB) 05/09/2013   BV (bacterial vaginosis) 05/09/2013   Dysmenorrhea 05/09/2013    Past Medical History:  Diagnosis Date   Aneurysm (HCC)    Arthritis    RA   Kidney stones    Lupus (HCC) 01/17/2021   Lupus (HCC)    Schizophrenia (HCC)    Systemic lupus erythematosus (HCC)  Family History  Problem Relation Age of Onset   Stroke Mother    Multiple sclerosis Father    Hypertension Father    Diabetes Father    Breast cancer Maternal Aunt        unknown age   Dementia Maternal Grandfather    Asthma Brother    Past Surgical History:  Procedure Laterality Date   BREAST BIOPSY Right 09/29/2022   US  RT BREAST BX W LOC DEV 1ST LESION IMG BX SPEC US  GUIDE 09/29/2022 [facility]MO[facility]   CESAREAN SECTION     HERNIA REPAIR     IR 3D INDEPENDENT WKST  07/28/2021   IR ANGIO INTRA EXTRACRAN SEL INTERNAL CAROTID BILAT MOD SED  07/28/2021   IR ANGIO VERTEBRAL SEL VERTEBRAL UNI L MOD SED  07/28/2021   IR ANGIOGRAM FOLLOW UP STUDY  07/28/2021   IR CT HEAD LTD  07/28/2021   IR NEURO EACH ADD'L AFTER BASIC UNI LEFT (MS)  07/28/2021   IR RADIOLOGIST EVAL & MGMT  07/10/2021   IR RADIOLOGIST EVAL & MGMT  08/15/2021   IR TRANSCATH/EMBOLIZ  07/28/2021   IR US  GUIDE VASC ACCESS RIGHT  07/28/2021   RADIOLOGY WITH ANESTHESIA N/A 07/28/2021   Procedure: EMBOLIZATION;  Surgeon: Dolphus Carrion, MD;  Location: [facility] OR;   Service: Radiology;  Laterality: N/A;   Social History   Social History Narrative   Right handed   Immunization History  Administered Date(s) Administered   Hepatitis A, Adult 08/26/2022, 03/03/2023   Hepb-cpg 06/25/2021, 09/02/2021   Influenza, Seasonal, Injecte, Preservative Fre 12/31/2022   Influenza,inj,Quad PF,6+ Mos 01/30/2021, 12/31/2021   Tdap 02/25/2022     Objective: Vital Signs: There were no vitals taken for this visit.   Physical Exam   Musculoskeletal Exam: ***  CDAI Exam: CDAI Score: -- Patient Global: --; Provider Global: -- Swollen: --; Tender: -- Joint Exam 04/07/2023   No joint exam has been documented for this visit   There is currently no information documented on the homunculus. Go to the Rheumatology activity and complete the homunculus joint exam.  Investigation: No additional findings.  Imaging: No results found.  Recent Labs: Lab Results  Component Value Date   WBC 7.4 12/23/2022   HGB 10.2 (L) 12/23/2022   PLT 175.0 12/23/2022   NA 139 12/23/2022   K 3.7 12/23/2022   CL 109 12/23/2022   CO2 24 12/23/2022   GLUCOSE 90 12/23/2022   BUN 9 12/23/2022   CREATININE 0.68 12/23/2022   BILITOT 0.4 12/23/2022   ALKPHOS 58 12/23/2022   AST 17 12/23/2022   ALT 13 12/23/2022   PROT 6.5 12/23/2022   ALBUMIN 4.0 12/23/2022   CALCIUM 8.9 12/23/2022   GFRAA 117 05/20/2020    Speciality Comments: [facility] Eye Exam: 04/09/2022 WNL  @[facility] f/u 3-4 weeks  Was advised patient has not been seen at [facility] since January because she no showed at least four appointments.   Procedures:  No procedures performed Allergies: Other   Assessment / Plan:     Visit Diagnoses: No diagnosis found.  ***  Orders: No orders of the defined types were placed in this encounter.  No orders of the defined types were placed in this encounter.    Follow-Up Instructions: No follow-ups on file.   Dominique LELON Ester, MD  Note - This record has been  created using [employer].  Chart creation errors have been sought, but may not always  have been located. Such creation errors do not reflect  on  the standard of medical care.

## 2023-04-08 ENCOUNTER — Other Ambulatory Visit (HOSPITAL_COMMUNITY): Payer: Self-pay

## 2023-04-09 ENCOUNTER — Other Ambulatory Visit (HOSPITAL_COMMUNITY): Payer: Self-pay

## 2023-04-13 ENCOUNTER — Encounter: Payer: Self-pay | Admitting: Pharmacist

## 2023-04-13 ENCOUNTER — Other Ambulatory Visit: Payer: Self-pay

## 2023-04-13 ENCOUNTER — Other Ambulatory Visit (HOSPITAL_COMMUNITY): Payer: Self-pay

## 2023-04-13 NOTE — Progress Notes (Signed)
 Specialty Pharmacy Refill Coordination Note  Dominique [name] is a 48 y.o. female assessed today regarding refills of clinic administered specialty medication(s) Cabotegravir  (Apretude )   Clinic requested Courier to Provider Office   Delivery date: 04/28/23   Verified address: [address] [place] [address]   Medication will be filled on 04/27/23.

## 2023-04-28 ENCOUNTER — Telehealth: Payer: Self-pay

## 2023-04-28 NOTE — Telephone Encounter (Signed)
[facility] Patient Advocate Encounter  Patient's medications APRETUDE have been couriered to [facility] from [facility] pharmacy and will be administered at the patients appointment on 05/04/23.  Kae Heller, CPhT Specialty Pharmacy Patient [facility] Phone: [phone] Fax:  [phone]

## 2023-04-29 ENCOUNTER — Telehealth (INDEPENDENT_AMBULATORY_CARE_PROVIDER_SITE_OTHER): Payer: MEDICAID | Admitting: Internal Medicine

## 2023-04-29 ENCOUNTER — Encounter: Payer: Self-pay | Admitting: Internal Medicine

## 2023-04-29 VITALS — Ht 60.0 in

## 2023-04-29 DIAGNOSIS — E042 Nontoxic multinodular goiter: Secondary | ICD-10-CM | POA: Diagnosis not present

## 2023-04-29 DIAGNOSIS — E0590 Thyrotoxicosis, unspecified without thyrotoxic crisis or storm: Secondary | ICD-10-CM | POA: Diagnosis not present

## 2023-04-29 NOTE — Progress Notes (Signed)
Virtual Visit via Video Note  I connected with Dominique [name] on 04/29/23 at 9:10 AM by a video enabled telemedicine application and verified that I am speaking with the correct person using two identifiers.   I discussed the limitations of evaluation and management by telemedicine and the availability of in person appointments. The patient expressed understanding and agreed to proceed.   -Location of the patient : home -Location of the provider : Office -The names of all persons participating in the telemedicine service : Pt and myself       Name: Dominique [name]  MRN/ DOB: [id number], 1976-01-29    Age/ Sex: 48 y.o., female    PCP: Dot Been, FNP   Reason for Endocrinology Evaluation: Subclinical hyperthyroidism     Date of Initial Endocrinology Evaluation: 12/23/2022    HPI: Ms. Dominique [name] is a 48 y.o. female with a past medical history of brain aneurysm, seronegative rheumatoid arthritis, schizophrenia and SLE. The patient presented for initial endocrinology clinic visit on 12/21/2022 for consultative assistance with her subclinical hyperthyroidism.   Patient was diagnosed with subclinical hyperthyroidism during routine follow-up with her PCP in October 2023 with a low TSH of 0.148 u IU/mL, normal T4 at 6.1 UG/DL.  Repeat TFTs continue to show low TSH 0.213 u IU/mL with normal free T4 at 1.28 NG/DL on February 06, 2022.  No Cardiac arrhythmia  No Osteoporosis     No FH of thyroid disease   On her initial visit to our clinic her TSH was 0.30u IU/mL, and since it was trending upwards, we opted not to treat.   Thyroid ultrasound 12/2022 showed multiple thyroid nodules with 3.8 cm left inferior nodule meeting FNA criteria   TRAb undetectable  SUBJECTIVE:    Today (04/29/23):  Dominique [name] is here for a follow up on subclinical hyperthyroidism and MNG.   Today she is having a lupus flair with weakness, and joint pain. No fever , or joint swelling    She continues with intermittent swelling or the neck and discomfort  Has mild palpitations as well as loose stools and diarrhea  Denies tremors  Denies anxiety or jittery sensation     HISTORY:  Past Medical History:  Past Medical History:  Diagnosis Date   Aneurysm (HCC)    Arthritis    RA   Kidney stones    Lupus (HCC) 01/17/2021   Lupus (HCC)    Schizophrenia (HCC)    Systemic lupus erythematosus (HCC)    Past Surgical History:  Past Surgical History:  Procedure Laterality Date   BREAST BIOPSY Right 09/29/2022   [place] RT BREAST BX W LOC DEV 1ST LESION IMG BX SPEC US GUIDE 09/29/2022 [facility]   CESAREAN SECTION     HERNIA REPAIR     IR 3D INDEPENDENT WKST  07/28/2021   IR ANGIO INTRA EXTRACRAN SEL INTERNAL CAROTID BILAT MOD SED  07/28/2021   IR ANGIO VERTEBRAL SEL VERTEBRAL UNI L MOD SED  07/28/2021   IR ANGIOGRAM FOLLOW UP STUDY  07/28/2021   IR CT HEAD LTD  07/28/2021   IR NEURO EACH ADD'L AFTER BASIC UNI LEFT (MS)  07/28/2021   IR RADIOLOGIST EVAL & MGMT  07/10/2021   IR RADIOLOGIST EVAL & MGMT  08/15/2021   IR TRANSCATH/EMBOLIZ  07/28/2021   IR US GUIDE VASC ACCESS RIGHT  07/28/2021   RADIOLOGY WITH ANESTHESIA N/A 07/28/2021   Procedure: EMBOLIZATION;  Surgeon: Julieanne Cotton, MD;  Location: [facility] OR;  Service:  Radiology;  Laterality: N/A;    Social History:  reports that she quit smoking about 21 months ago. Her smoking use included cigarettes. She started smoking about 16 years ago. She has a 3.8 pack-year smoking history. She has been exposed to tobacco smoke. She has never used smokeless tobacco. She reports that she does not currently use alcohol. She reports that she does not currently use drugs after having used the following drugs: Marijuana. Family History: family history includes Asthma in her brother; Breast cancer in her maternal aunt; Dementia in her maternal grandfather; Diabetes in her father; Hypertension in her father; Multiple sclerosis in her father; Stroke in her  mother.   HOME MEDICATIONS: Allergies as of 04/29/2023       Reactions   Other         Medication List        Accurate as of April 29, 2023  9:04 AM. If you have any questions, ask your nurse or doctor.          Apretude 600 MG/3ML injection Generic drug: cabotegravir ER Inject 3 mLs (600 mg total) into the muscle every 2 (two) months.   capsaicin 0.025 % cream Commonly known as: ZOSTRIX Apply 1 application. topically 3 (three) times daily.   cetirizine 10 MG tablet Commonly known as: ZYRTEC Take 10 mg by mouth daily.   citalopram 20 MG tablet Commonly known as: CELEXA Take 1 tablet by mouth daily.   clopidogrel 75 MG tablet Commonly known as: Plavix Take 0.5 tablets (37.5 mg total) by mouth daily.   Depo-SubQ Provera 104 104 MG/0.65ML injection Generic drug: medroxyPROGESTERone every 3 (three) months.   Depo-SubQ Provera 104 104 MG/0.65ML injection Generic drug: medroxyPROGESTERone Inject into the skin.   diclofenac Sodium 1 % Gel Commonly known as: Voltaren Apply 2 g topically 4 (four) times daily.   famotidine 20 MG tablet Commonly known as: PEPCID Take 20 mg by mouth at bedtime.   fluticasone 50 MCG/ACT nasal spray Commonly known as: FLONASE Place into the nose.   folic acid 1 MG tablet Commonly known as: FOLVITE Take 1 tablet (1 mg total) by mouth daily.   folic acid 1 MG tablet Commonly known as: FOLVITE Take 1 tablet by mouth daily.   gabapentin 600 MG tablet Commonly known as: NEURONTIN Take 600 mg by mouth 3 (three) times daily.   hydroxychloroquine 200 MG tablet Commonly known as: PLAQUENIL TAKE 1 TABLET(200 MG) BY MOUTH DAILY   meloxicam 15 MG tablet Commonly known as: MOBIC Take 1 tablet (15 mg total) by mouth daily.   norethindrone 0.35 MG tablet Commonly known as: MICRONOR Take 1 tablet by mouth daily.   nortriptyline 25 MG capsule Commonly known as: PAMELOR TAKE 1 CAPSULE(25 MG) BY MOUTH AT BEDTIME   pregabalin  100 MG capsule Commonly known as: LYRICA Take by mouth.   pregabalin 100 MG capsule Commonly known as: LYRICA Take 100 mg by mouth 3 (three) times daily.   QUEtiapine 100 MG tablet Commonly known as: SEROQUEL Take 100 mg by mouth daily.   Rollator Ultra-Light Misc Rollator/Rolling Walker with seat, use for balance and support with ambulation   sulfaSALAzine 500 MG EC tablet Commonly known as: AZULFIDINE TAKE 1 TABLET(500 MG) BY MOUTH TWICE DAILY   tiZANidine 4 MG tablet Commonly known as: ZANAFLEX Take 4 mg by mouth 2 (two) times daily.   valACYclovir 500 MG tablet Commonly known as: VALTREX Take 500 mg by mouth daily.   Vitamin D (Ergocalciferol) 1.25 MG (  50000 UNIT) Caps capsule Commonly known as: DRISDOL Take 50,000 Units by mouth once a week.          REVIEW OF SYSTEMS: A comprehensive ROS was conducted with the patient and is negative except as per HPI     OBJECTIVE:  VS: There were no vitals taken for this visit.   Wt Readings from Last 3 Encounters:  12/23/22 123 lb 3.2 oz (55.9 kg)  11/02/22 121 lb (54.9 kg)  07/15/22 122 lb (55.3 kg)     EXAM: General: Pt appears well and is in NAD  Mental Status: Judgment, insight: Intact Orientation: Oriented to time, place, and person Mood and affect: No depression, anxiety, or agitation     DATA REVIEWED:   Latest Reference Range & Units 12/23/22 10:10  TSH 0.35 - 5.50 uIU/mL 0.30 (L)  Triiodothyronine,Free,Serum 2.3 - 4.2 pg/mL 2.7  T4,Free(Direct) 0.60 - 1.60 ng/dL 4.78      Latest Reference Range & Units 12/23/22 10:10  Sodium 135 - 145 mEq/L 139  Potassium 3.5 - 5.1 mEq/L 3.7  Chloride 96 - 112 mEq/L 109  CO2 19 - 32 mEq/L 24  Glucose 70 - 99 mg/dL 90  BUN 6 - 23 mg/dL 9  Creatinine 2.95 - 6.21 mg/dL 3.08  Calcium 8.4 - 65.7 mg/dL 8.9  Alkaline Phosphatase 39 - 117 U/L 58  Albumin 3.5 - 5.2 g/dL 4.0  AST 0 - 37 U/L 17  ALT 0 - 35 U/L 13  Total Protein 6.0 - 8.3 g/dL 6.5  Total  Bilirubin 0.2 - 1.2 mg/dL 0.4  GFR >84.69 mL/min 104.55     Latest Reference Range & Units 12/23/22 10:10  TRAB <=2.00 IU/L <1.00      Thyroid Ultrasound 01/05/2023  Narrative & Impression  CLINICAL DATA:  Patient was diagnosed subclinical hyperthyroidism, has noted recent local neck swelling with tenderness on exam today to the left   EXAM: THYROID ULTRASOUND   TECHNIQUE: Ultrasound examination of the thyroid gland and adjacent soft tissues was performed.   COMPARISON:  None Available.   FINDINGS: Parenchymal Echotexture: Mildly heterogenous   Isthmus: 1.1 cm   Right lobe: 5.6 x 2.4 x 2.6 cm   Left lobe: 6.2 x 3.2 x 3.0 cm   _________________________________________________________   Estimated total number of nodules >/= 1 cm: 2   Number of spongiform nodules >/=  2 cm not described below (TR1): 0   Number of mixed cystic and solid nodules >/= 1.5 cm not described below (TR2): 0   _________________________________________________________   Nodule labeled 1 is a solid hypoechoic TR 4 nodule in the thyroid isthmus measuring 1.1 x 0.9 x 0.7 cm. *Given size (>/= 1 - 1.4 cm) and appearance, a follow-up ultrasound in 1 year should be considered based on TI-RADS criteria.   Nodule labeled 2 is a solid isoechoic TR 3 nodule in the thyroid isthmus measuring less than 1 cm in size. Given size (<1.4 cm) and appearance, this nodule does NOT meet TI-RADS criteria for biopsy or dedicated follow-up.   Nodule labeled 3 is a predominantly solid isoechoic TR 3 nodule in the inferior left thyroid lobe measuring 3.8 x 2.5 x 2.2 cm. **Given size (>/= 2.5 cm) and appearance, fine needle aspiration of this mildly suspicious nodule should be considered based on TI-RADS criteria.   IMPRESSION: 1. Enlarged, multinodular thyroid gland. 2. Nodule labeled 3 in the inferior left thyroid lobe (3.8 cm TR 3) meets criteria for biopsy. 3. Nodule labeled 1 in the  thyroid isthmus (1.1 cm  TR 4) meets criteria for follow-up ultrasound in 1 year. A total follow-up interval of 5 years is recommended, with this exam serving as a baseline.     FNA left inferior nodule 02/04/2023   Clinical History: Nodule labeled 3 is a predominantly solid isoechoic TR 3 nodule in the inferior left thyroid lobe measuring 3.8 x 2.5 x 2.2 cm. Given size (>/=2.5 cm) and appearance, fine needle aspiration of this mildly suspicious nodule should be considered based on TI Specimen Submitted:  A. THYROID, LEFT LOBE, FINE NEEDLE ASPIRATION   FINAL MICROSCOPIC DIAGNOSIS: - Benign follicular nodule (Bethesda category II)       ASSESSMENT/PLAN/RECOMMENDATIONS:   Subclinical Hyperthyroidism:  -Patient with symptoms of fatigue, occasional palpitations and diarrhea  -She also have SLE which is a contributing factor to some of the symptoms -TRAb undetectable  -Most likely due to autonomous thyroid nodules -Patient will return next week for a lab appointment to check on her thyroid, will have low threshold in starting methimazole given above symptoms  2. MNG:  -S/p benign FNA of the left inferior 3.8 cm nodule 01/2023 -Will repeat by next visit   Follow-up in 6 months  Signed electronically by: Lyndle Herrlich, MD  [facility] Endocrinology  [facility] [address] [place], [place] [address] Phone: [phone] FAX: [phone]   CC: Dot Been, FNP [address] [place] [address] Phone: [phone] Fax: [phone]   Return to Endocrinology clinic as below: Future Appointments  Date Time Provider Department Center  04/29/2023  9:10 AM Tramain Gershman, Konrad Dolores, MD [facility] None  05/04/2023  9:45 AM Jennette Kettle, RPH-CPP [facility]  06/28/2023 10:20 AM Dimple Casey, Jamesetta Orleans, MD [facility] None

## 2023-04-30 NOTE — Progress Notes (Deleted)
 HPI: Dominique [name] is a 48 y.o. female who presents to the [facility] pharmacy clinic for Apretude  administration and HIV PrEP follow up.  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   CAPSAICIN  (ZOSTRIX) 0.025 % CREAM    Apply 1 application. topically 3 (three) times daily.   CETIRIZINE (ZYRTEC) 10 MG TABLET    Take 10 mg by mouth daily.   CITALOPRAM (CELEXA) 20 MG TABLET    Take 1 tablet by mouth daily.   CLOPIDOGREL  (PLAVIX ) 75 MG TABLET    Take 0.5 tablets (37.5 mg total) by mouth daily.   DEPO-SUBQ PROVERA  104 104 MG/0.65ML INJECTION    every 3 (three) months.   DICLOFENAC  SODIUM (VOLTAREN ) 1 % GEL    Apply 2 g topically 4 (four) times daily.   FAMOTIDINE (PEPCID) 20 MG TABLET    Take 20 mg by mouth at bedtime.   FLUTICASONE (FLONASE) 50 MCG/ACT NASAL SPRAY    Place into the nose.   FOLIC ACID  (FOLVITE ) 1 MG TABLET     Take 1 tablet (1 mg total) by mouth daily.   FOLIC ACID  (FOLVITE ) 1 MG TABLET    Take 1 tablet by mouth daily.   GABAPENTIN  (NEURONTIN ) 600 MG TABLET    Take 600 mg by mouth 3 (three) times daily.   HYDROXYCHLOROQUINE  (PLAQUENIL ) 200 MG TABLET    TAKE 1 TABLET(200 MG) BY MOUTH DAILY   MEDROXYPROGESTERONE  (DEPO-SUBQ PROVERA  104) 104 MG/0.65ML INJECTION    Inject into the skin.   MELATONIN (MELATONIN MAXIMUM STRENGTH) 5 MG TABS    Take by mouth.   MELOXICAM  (MOBIC ) 15 MG TABLET    Take 1 tablet (15 mg total) by mouth daily.   MISC. DEVICES (ROLLATOR ULTRA-LIGHT) MISC    Rollator/Rolling Walker with seat, use for balance and support with ambulation   NORETHINDRONE (MICRONOR) 0.35 MG TABLET    Take 1 tablet by mouth daily.   NORTRIPTYLINE  (PAMELOR ) 25 MG CAPSULE    TAKE 1 CAPSULE(25 MG) BY MOUTH AT BEDTIME   PREGABALIN (LYRICA) 100 MG CAPSULE    Take by mouth.   PREGABALIN (LYRICA) 100 MG CAPSULE    Take 100 mg by mouth 3 (three) times daily.   QUETIAPINE  (SEROQUEL ) 100 MG TABLET    Take 100 mg by mouth daily.   SULFASALAZINE  (AZULFIDINE ) 500 MG EC TABLET    TAKE 1 TABLET(500 MG) BY MOUTH TWICE DAILY   TIZANIDINE (ZANAFLEX) 4  MG TABLET    Take 4 mg by mouth 2 (two) times daily.   VALACYCLOVIR (VALTREX) 500 MG TABLET    Take 500 mg by mouth daily.   VITAMIN D , ERGOCALCIFEROL , (DRISDOL ) 1.25 MG (50000 UNIT) CAPS CAPSULE    Take 50,000 Units by mouth once a week.  Modified Medications   No medications on file  Discontinued Medications   No medications on file    Allergies: Allergies  Allergen Reactions   Other     Past Medical History: Past Medical History:  Diagnosis Date   Aneurysm (HCC)    Arthritis    RA   Kidney stones    Lupus 01/17/2021   Lupus    Schizophrenia (HCC)    Systemic lupus erythematosus (HCC)     Social History: Social History   Socioeconomic History   Marital status: Single    Spouse name: Not on file   Number of children: Not on file   Years of  education: Not on file   Highest education level: Not on file  Occupational History   Not on file  Tobacco Use   Smoking status: Former    Current packs/day: 0.00    Average packs/day: 0.3 packs/day for 15.0 years (3.8 ttl pk-yrs)    Types: Cigarettes    Start date: 07/16/2006    Quit date: 07/15/2021    Years since quitting: 1.7    Passive exposure: Past   Smokeless tobacco: Never  Vaping Use   Vaping status: Never Used  Substance and Sexual Activity   Alcohol use: Not Currently   Drug use: Not Currently    Types: Marijuana    Comment: Last use was on 07/15/21   Sexual activity: Yes    Partners: Male    Birth control/protection: Surgical, Injection  Other Topics Concern   Not on file  Social History Narrative   Right handed   Social Drivers of Health   Financial Resource Strain: At Risk (09/17/2022)   Received from [place], [place]   Financial Resource Strain    Financial Resource Strain: 2  Food Insecurity: Not on file (09/17/2022)  Transportation Needs: Not at Risk (09/17/2022)   Received from [place], [employer] Needs    Transportation: 1  Physical Activity: Not on File (09/05/2021)   Received from [place], [place]   Physical Activity    Physical Activity: 0  Stress: Not on File (09/05/2021)   Received from [place], [place]   Stress    Stress: 0  Social Connections: Not on File (12/01/2022)   Received from [facility]   Social Connections    Connectedness: 0    Labs: Lab Results  Component Value Date   HIV1RNAQUANT Not Detected 03/03/2023   HIV1RNAQUANT Not Detected 12/31/2022   HIV1RNAQUANT Not Detected 11/04/2022    RPR and STI Lab Results  Component Value Date   LABRPR REACTIVE (A) 03/03/2023   LABRPR REACTIVE (A) 08/26/2022   LABRPR REACTIVE (A) 06/24/2022   LABRPR REACTIVE (A) 04/29/2022   LABRPR REACTIVE (A) 06/25/2021   RPRTITER 1:1 (H) 03/03/2023   RPRTITER 1:2 (H) 08/26/2022   RPRTITER 1:2 (H) 06/24/2022   RPRTITER 1:2 (H) 04/29/2022   RPRTITER 1:8  (H) 06/25/2021    STI Results GC GC CT CT  03/03/2023  9:41 AM Negative    Negative   Negative    Negative    08/26/2022  9:14 AM Negative    Negative   Negative    Negative    06/24/2022  10:42 AM Negative    Negative   Negative    Negative    04/29/2022 10:00 AM Negative   Negative    10/31/2021  9:06 AM Negative   Negative    09/02/2021  9:56 AM Negative   Negative    06/25/2021  8:33 AM Negative   Negative    01/30/2021 10:14 AM Negative   Negative    10/16/2019  3:39 PM Negative   Negative    05/09/2013 10:57 AM  NEGATIVE   NEGATIVE   12/07/2011 12:20 PM   NEGATIVE      Hepatitis B Lab Results  Component Value Date   HEPBSAB REACTIVE (A) 12/31/2021   HEPBSAG NON-REACTIVE 12/18/2020   HEPBCAB NON-REACTIVE 12/18/2020   Hepatitis C Lab Results  Component Value Date   HEPCAB NON-REACTIVE 12/18/2020   Hepatitis A Lab Results  Component Value Date   HAV NON-REACTIVE 06/24/2022   Lipids: Lab Results  Component Value Date   CHOL 154 07/28/2019   TRIG 96 07/28/2019   HDL 57 07/28/2019   CHOLHDL 2.7 07/28/2019   LDLCALC 79 07/28/2019    TARGET DATE: The 3rd of the month  Assessment: Dominique [name] presents today for their Apretude  injection and to follow up for HIV PrEP. No issues with past injections.  Screened patient for acute HIV symptoms such as fatigue, muscle aches, rash, sore throat, lymphadenopathy, headache, night sweats, nausea/vomiting/diarrhea, and fever. Patient denies any symptoms.   Per [employer] guidelines, a rapid HIV test should be drawn prior to Apretude  administration. Due to state shortage of rapid HIV tests, this is temporarily unable to be done. Per decision from [facility] physicians, we will proceed with Apretude  administration at this time without a negative rapid HIV test beforehand. HIV RNA was collected today and is in process.  Administered cabotegravir  600mg /39mL in *** upper outer quadrant of the gluteal muscle. Will make follow up appointments  for maintenance injections every 2 months.   No known exposures to any STIs and no signs or symptoms of any STIs today. Last STI screening was in December and was negative. *** new partners since last injection; will check *** today.   Plan: - Administer Apretude  600 mg x 1  - Maintenance injections scheduled for *** - Check HIV RNA and *** - Call with any issues or questions  Alan Geralds, PharmD, CPP, BCIDP, AAHIVP Clinical Pharmacist Practitioner Infectious Diseases Clinical Pharmacist [facility]

## 2023-05-03 ENCOUNTER — Other Ambulatory Visit: Payer: MEDICAID

## 2023-05-04 ENCOUNTER — Ambulatory Visit: Payer: MEDICAID | Admitting: Pharmacist

## 2023-05-04 DIAGNOSIS — Z23 Encounter for immunization: Secondary | ICD-10-CM

## 2023-05-04 DIAGNOSIS — Z79899 Other long term (current) drug therapy: Secondary | ICD-10-CM

## 2023-05-11 ENCOUNTER — Other Ambulatory Visit: Payer: 59

## 2023-05-11 ENCOUNTER — Ambulatory Visit (INDEPENDENT_AMBULATORY_CARE_PROVIDER_SITE_OTHER): Payer: 59 | Admitting: Pharmacist

## 2023-05-11 ENCOUNTER — Other Ambulatory Visit (HOSPITAL_COMMUNITY)
Admission: RE | Admit: 2023-05-11 | Discharge: 2023-05-11 | Disposition: A | Discharge status: Home / Self Care | Payer: 59 | Source: Ambulatory Visit | Attending: Infectious Disease | Admitting: Infectious Disease

## 2023-05-11 ENCOUNTER — Other Ambulatory Visit: Payer: Self-pay

## 2023-05-11 ENCOUNTER — Telehealth: Payer: Self-pay | Admitting: Internal Medicine

## 2023-05-11 DIAGNOSIS — Z113 Encounter for screening for infections with a predominantly sexual mode of transmission: Secondary | ICD-10-CM

## 2023-05-11 DIAGNOSIS — Z2981 Encounter for HIV pre-exposure prophylaxis: Secondary | ICD-10-CM

## 2023-05-11 DIAGNOSIS — Z114 Encounter for screening for human immunodeficiency virus [HIV]: Secondary | ICD-10-CM

## 2023-05-11 DIAGNOSIS — Z79899 Other long term (current) drug therapy: Secondary | ICD-10-CM

## 2023-05-11 DIAGNOSIS — Z23 Encounter for immunization: Secondary | ICD-10-CM

## 2023-05-11 MED ORDER — CABOTEGRAVIR ER 600 MG/3ML IM SUER
600.0000 mg | Freq: Once | INTRAMUSCULAR | Status: AC
  Administered 2023-05-11: 600 mg via INTRAMUSCULAR

## 2023-05-11 NOTE — Telephone Encounter (Signed)
endo

## 2023-05-11 NOTE — Progress Notes (Signed)
HPI: Dominique [name] is a 48 y.o. female who presents to the [facility] pharmacy clinic for Apretude administration and HIV PrEP follow up.  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   CAPSAICIN (ZOSTRIX) 0.025 % CREAM    Apply 1 application. topically 3 (three) times daily.   CETIRIZINE (ZYRTEC) 10 MG TABLET    Take 10 mg by mouth daily.   CITALOPRAM (CELEXA) 20 MG TABLET    Take 1 tablet by mouth daily.   CLOPIDOGREL (PLAVIX) 75 MG TABLET    Take 0.5 tablets (37.5 mg total) by mouth daily.   DEPO-SUBQ PROVERA 104 104 MG/0.65ML INJECTION    every 3 (three) months.   DICLOFENAC SODIUM (VOLTAREN) 1 % GEL    Apply 2 g topically 4 (four) times daily.   FAMOTIDINE (PEPCID) 20 MG TABLET    Take 20 mg by mouth at bedtime.   FLUTICASONE (FLONASE) 50 MCG/ACT NASAL SPRAY    Place into the nose.    FOLIC ACID (FOLVITE) 1 MG TABLET    Take 1 tablet (1 mg total) by mouth daily.   FOLIC ACID (FOLVITE) 1 MG TABLET    Take 1 tablet by mouth daily.   GABAPENTIN (NEURONTIN) 600 MG TABLET    Take 600 mg by mouth 3 (three) times daily.   HYDROXYCHLOROQUINE (PLAQUENIL) 200 MG TABLET    TAKE 1 TABLET(200 MG) BY MOUTH DAILY   MEDROXYPROGESTERONE (DEPO-SUBQ PROVERA 104) 104 MG/0.65ML INJECTION    Inject into the skin.   MELATONIN (MELATONIN MAXIMUM STRENGTH) 5 MG TABS    Take by mouth.   MELOXICAM (MOBIC) 15 MG TABLET    Take 1 tablet (15 mg total) by mouth daily.   MISC. DEVICES (ROLLATOR ULTRA-LIGHT) MISC    Rollator/Rolling Walker with seat, use for balance and support with ambulation   NORETHINDRONE (MICRONOR) 0.35 MG TABLET    Take 1 tablet by mouth daily.   NORTRIPTYLINE (PAMELOR) 25 MG CAPSULE    TAKE 1 CAPSULE(25 MG) BY MOUTH AT BEDTIME   PREGABALIN (LYRICA) 100 MG CAPSULE    Take by mouth.   PREGABALIN (LYRICA) 100 MG CAPSULE    Take 100 mg by mouth 3 (three) times daily.   QUETIAPINE (SEROQUEL) 100 MG TABLET    Take 100 mg by mouth daily.   SULFASALAZINE (AZULFIDINE) 500 MG EC TABLET    TAKE 1  TABLET(500 MG) BY MOUTH TWICE DAILY   TIZANIDINE (ZANAFLEX) 4 MG TABLET    Take 4 mg by mouth 2 (two) times daily.   VALACYCLOVIR (VALTREX) 500 MG TABLET    Take 500 mg by mouth daily.   VITAMIN D, ERGOCALCIFEROL, (DRISDOL) 1.25 MG (50000 UNIT) CAPS CAPSULE    Take 50,000 Units by mouth once a week.  Modified Medications   No medications on file  Discontinued Medications   No medications on file    Allergies: Allergies  Allergen Reactions   Other     Labs: Lab Results  Component Value Date   HIV1RNAQUANT Not Detected 03/03/2023   HIV1RNAQUANT Not Detected 12/31/2022   HIV1RNAQUANT Not Detected 11/04/2022    RPR and STI Lab Results  Component Value Date   LABRPR REACTIVE (A) 03/03/2023   LABRPR REACTIVE (A) 08/26/2022   LABRPR REACTIVE (A) 06/24/2022   LABRPR REACTIVE (A) 04/29/2022    LABRPR REACTIVE (A) 06/25/2021   RPRTITER 1:1 (H) 03/03/2023   RPRTITER 1:2 (H) 08/26/2022   RPRTITER 1:2 (H) 06/24/2022   RPRTITER 1:2 (H) 04/29/2022   RPRTITER 1:8 (H) 06/25/2021    STI Results GC GC CT CT  03/03/2023  9:41 AM Negative    Negative   Negative    Negative    08/26/2022  9:14 AM Negative    Negative   Negative    Negative    06/24/2022 10:42 AM Negative    Negative   Negative    Negative    04/29/2022 10:00 AM Negative   Negative    10/31/2021  9:06 AM Negative   Negative    09/02/2021  9:56 AM Negative   Negative    06/25/2021  8:33 AM Negative   Negative    01/30/2021 10:14 AM Negative   Negative    10/16/2019  3:39 PM Negative   Negative    05/09/2013 10:57 AM  NEGATIVE   NEGATIVE   12/07/2011 12:20 PM   NEGATIVE      Hepatitis B Lab Results  Component Value Date   HEPBSAB REACTIVE (A) 12/31/2021   HEPBSAG NON-REACTIVE 12/18/2020   HEPBCAB NON-REACTIVE 12/18/2020   Hepatitis C Lab Results  Component Value Date   HEPCAB NON-REACTIVE 12/18/2020   Hepatitis A Lab Results  Component Value Date   HAV NON-REACTIVE 06/24/2022   Lipids: Lab Results  Component Value Date   CHOL 154 07/28/2019   TRIG 96 07/28/2019   HDL 57 07/28/2019   CHOLHDL 2.7 07/28/2019   LDLCALC 79 07/28/2019    TARGET DATE: 3rd  Assessment: Loraine presents today for 2 month Apretude injection and to follow up for HIV PrEP. Last saw Cassie on 03/03/23 for PrEP follow up and Apretude injection. No issues with past injections. Denies any symptoms of acute HIV. Last STI screening (RPR, oral and urine cytologies) was on 12/4 and was negative. No known exposures to any STIs since last visit. Agrees to STI testing today. Will check oral and urine cytologies.   Per [employer] guidelines, a rapid HIV test should be drawn prior to Apretude administration. Due to state shortage of rapid HIV tests, this is temporarily unable to be done. Per decision from [facility] physicians, we will  proceed with Apretude administration at this time without a negative rapid HIV test beforehand. HIV RNA was collected today and is in process.  Administered cabotegravir 600mg /74mL in right upper outer quadrant of the gluteal muscle. Will see Domini back in 2 months for injection, labs,  and HIV PrEP follow up.  Completed HepA vaccine series at last visit 12/4. Will check HepA ab to verify immunity today.  Plan: - Apretude injection administered - HIV RNA today - HepA ab - Urine and oral cytologies - Next injection, labs, and PrEP follow up appointment scheduled for 06/29/23 with Cassie - Call with any issues or questions  Stephenie Acres, PharmD PGY1 Pharmacy Resident 05/11/2023 12:52 PM

## 2023-05-11 NOTE — Telephone Encounter (Signed)
ERROR

## 2023-05-12 ENCOUNTER — Encounter: Payer: Self-pay | Admitting: Internal Medicine

## 2023-05-12 LAB — T4, FREE: Free T4: 1 ng/dL (ref 0.8–1.8)

## 2023-05-12 LAB — T3, FREE: T3, Free: 3.1 pg/mL (ref 2.3–4.2)

## 2023-05-12 LAB — URINE CYTOLOGY ANCILLARY ONLY
Chlamydia: NEGATIVE
Comment: NEGATIVE
Comment: NORMAL
Neisseria Gonorrhea: NEGATIVE

## 2023-05-12 LAB — TSH: TSH: 0.46 m[IU]/L

## 2023-05-12 LAB — CYTOLOGY, (ORAL, ANAL, URETHRAL) ANCILLARY ONLY
Chlamydia: NEGATIVE
Comment: NEGATIVE
Comment: NORMAL
Neisseria Gonorrhea: NEGATIVE

## 2023-05-13 LAB — HEPATITIS A ANTIBODY, TOTAL: Hepatitis A AB,Total: REACTIVE — AB

## 2023-05-13 LAB — HIV-1 RNA QUANT-NO REFLEX-BLD
HIV 1 RNA Quant: NOT DETECTED {copies}/mL
HIV-1 RNA Quant, Log: NOT DETECTED {Log}

## 2023-05-15 ENCOUNTER — Other Ambulatory Visit: Payer: Self-pay | Admitting: Internal Medicine

## 2023-05-15 DIAGNOSIS — R768 Other specified abnormal immunological findings in serum: Secondary | ICD-10-CM

## 2023-05-15 DIAGNOSIS — R21 Rash and other nonspecific skin eruption: Secondary | ICD-10-CM

## 2023-05-15 DIAGNOSIS — M2550 Pain in unspecified joint: Secondary | ICD-10-CM

## 2023-05-17 NOTE — Telephone Encounter (Signed)
Last Fill: 11/18/2022  Eye exam: 04/09/2022 WNL    Labs: 12/23/2022 RBC 3.15, Hgb 10.2, Hct 31.2  Next Visit: 06/28/2023  Last Visit: 11/02/2022  BM:[id number] rheumatoid arthritis   Current Dose per office note 11/02/2022: hydroxychloroquine 200 mg daily   Attempted to contact the patient to advise she is due to update her eye exam. Unable to leave a message.   Okay to refill Plaquenil?

## 2023-05-18 ENCOUNTER — Telehealth (HOSPITAL_COMMUNITY): Payer: Self-pay

## 2023-05-18 ENCOUNTER — Other Ambulatory Visit (HOSPITAL_COMMUNITY): Payer: Self-pay | Admitting: Interventional Radiology

## 2023-05-18 DIAGNOSIS — I671 Cerebral aneurysm, nonruptured: Secondary | ICD-10-CM

## 2023-05-18 NOTE — Telephone Encounter (Signed)
 Called to schedule mra, no answer, left vm. AB

## 2023-05-21 ENCOUNTER — Other Ambulatory Visit: Payer: Self-pay

## 2023-05-21 DIAGNOSIS — Z79899 Other long term (current) drug therapy: Secondary | ICD-10-CM

## 2023-05-21 MED ORDER — SULFASALAZINE 500 MG PO TBEC: 500.0000 mg | DELAYED_RELEASE_TABLET | Freq: Two times a day (BID) | ORAL | 0 refills | Status: DC

## 2023-05-21 NOTE — Telephone Encounter (Signed)
Refill request received via fax from [facility] for Sulfasalazine. Patient states she would like her Hydroxychloroquine and Sulfasalazine sent to [facility] now. Patient states she has already picked up the hydroxychloroquine that was filled recently so for now she just needs the Sulfasalazine.    Last Fill: 03/05/2023  Labs: 12/23/2022 RBC 3.15 Hemoglobin 10.2 HCT 31.2  Next Visit: 06/28/2023  Last Visit: 11/02/2022  DX: Seronegative rheumatoid arthritis (HCC)   Current Dose per office note 11/02/2022: sulfasalazine 1000 mg twice daily.   Dose decreased per lab results on 11/02/2022: I recommend decreasing the dose by half for now and we could recheck her labs (Hgb, LFTs) after 1 month of reducing this.   Contacted patient and advised her labs are due. Patient states she can come in Monday to get the labs done.   Okay to refill Sulfasalazine?

## 2023-05-24 ENCOUNTER — Other Ambulatory Visit: Payer: Self-pay | Admitting: *Deleted

## 2023-05-24 DIAGNOSIS — Z79899 Other long term (current) drug therapy: Secondary | ICD-10-CM

## 2023-05-25 LAB — CBC WITH DIFFERENTIAL/PLATELET
Absolute Lymphocytes: 1800 {cells}/uL (ref 850–3900)
Absolute Monocytes: 405 {cells}/uL (ref 200–950)
Basophils Absolute: 0 {cells}/uL (ref 0–200)
Basophils Relative: 0 %
Eosinophils Absolute: 53 {cells}/uL (ref 15–500)
Eosinophils Relative: 0.7 %
HCT: 32.2 % — ABNORMAL LOW (ref 35.0–45.0)
Hemoglobin: 10.5 g/dL — ABNORMAL LOW (ref 11.7–15.5)
MCH: 31.3 pg (ref 27.0–33.0)
MCHC: 32.6 g/dL (ref 32.0–36.0)
MCV: 96.1 fL (ref 80.0–100.0)
MPV: 11.1 fL (ref 7.5–12.5)
Monocytes Relative: 5.4 %
Neutro Abs: 5243 {cells}/uL (ref 1500–7800)
Neutrophils Relative %: 69.9 %
Platelets: 189 10*3/uL (ref 140–400)
RBC: 3.35 10*6/uL — ABNORMAL LOW (ref 3.80–5.10)
RDW: 13 % (ref 11.0–15.0)
Total Lymphocyte: 24 %
WBC: 7.5 10*3/uL (ref 3.8–10.8)

## 2023-05-25 LAB — COMPLETE METABOLIC PANEL WITH GFR
AG Ratio: 1.8 (calc) (ref 1.0–2.5)
ALT: 14 U/L (ref 6–29)
AST: 19 U/L (ref 10–35)
Albumin: 4.1 g/dL (ref 3.6–5.1)
Alkaline phosphatase (APISO): 66 U/L (ref 31–125)
BUN: 11 mg/dL (ref 7–25)
CO2: 26 mmol/L (ref 20–32)
Calcium: 9 mg/dL (ref 8.6–10.2)
Chloride: 110 mmol/L (ref 98–110)
Creat: 0.77 mg/dL (ref 0.50–0.99)
Globulin: 2.3 g/dL (ref 1.9–3.7)
Glucose, Bld: 102 mg/dL — ABNORMAL HIGH (ref 65–99)
Potassium: 4.1 mmol/L (ref 3.5–5.3)
Sodium: 141 mmol/L (ref 135–146)
Total Bilirubin: 0.4 mg/dL (ref 0.2–1.2)
Total Protein: 6.4 g/dL (ref 6.1–8.1)
eGFR: 96 mL/min/{1.73_m2} (ref >=60)

## 2023-05-25 NOTE — Progress Notes (Signed)
 Blood count and kidney and liver function are all normal.  No problem for continuing the hydroxychloroquine and sulfasalazine.

## 2023-05-31 ENCOUNTER — Telehealth: Payer: Self-pay

## 2023-05-31 NOTE — Telephone Encounter (Signed)
 Received a call from [facility] inquiring if we could send a prescription of the patient's sulfasalazine and Hydroxychloroquine. Contacted [facility] and advised the Sulfasalazine was already sent and the hydroxychloroquine was sent to [facility] which the patient has already picked.

## 2023-06-14 NOTE — Progress Notes (Deleted)
 Office Visit Note  Patient: Dominique [name]             Date of Birth: [birth year]           MRN: [id number]             PCP: Dot Been, FNP Referring: Dot Been, FNP Visit Date: 06/28/2023   Subjective:  No chief complaint on file.   History of Present Illness: Dominique [name] is a 48 y.o. 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    Previous HPI 11/02/2022 Dominique [name] is a 48 y.o. 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  Since her last visit she has not had any major exacerbation of symptoms.  Most problematic joint still in bilateral ankles mostly pain with weightbearing.  Does not see a lot of associated swelling occasionally some that last less than 1 day duration.  Still with discolored skin rashes mostly on the face not itchy or painful.  She is noticing more bruising on her lower legs does not recall any preceding trauma to the sites.  No bruising on the upper part of her body.  She is scheduled for follow-up 50-month MRI for monitoring of aneurysm but with no new symptom complaints.   Previous HPI 07/15/2022 Dominique [name] is a 48 y.o. 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  Since her last visit she saw physical therapy completed 1 session with them so far is scheduled to follow-up planning to do some pool or other aquatic therapy.  She switched to Lyrica with pain management so far finding this more effective than the gabapentin for ongoing symptoms.  She had mildly low potassium checked in previous clinic visit was concerned if this contributed to muscle cramping issues but was improved back to normal range upon recheck.  Still getting some stiffness and soreness most noticeably at the feet and ankles and some lasting through the  day but no major exacerbation.  Not seeing much visible swelling or rashes.   Previous HPI 04/15/22 Dominique [name] is a 48 y.o. female here for follow up for seronegative RA (ANA+) on hydroxychloroquine 200 mg daily and sulfasalazine 1000 mg twice.  No major flareups since her last visit.  Hyperpigmented skin changes mostly around the face may be the same or slightly worse.  Still with persistent foot and ankle pain.   Previous HPI 12/10/21 Dominique [name] is a 48 y.o. 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  Most symptoms are stable main complaint today is bilateral foot and ankle pain and swelling. No obvious difference off of HCQ. Pain is throughout the day describes as stabbing with pins type of sensation. She is seeing swelling accumulate usually by the evening and worse when sitting or standing in fixed position for a long time.    Previous HPI 09/12/2021 Dominique [name] is a 48 y.o. 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 She feels symptoms partially improved with less knee pain but continues to have every day pain in bilateral feet and ankles. Pain is first thing in the morning as well as worsening if she is on her  feet all day. Feet swelling is noticeable with prolonged standing worse later in the day. She had coil embolization procedure uneventfully. She has noticed some progression of facial rash.   Previous HPI 07/21/21 Dominique [name] is a 48 y.o. 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 So far she still has about the same amount of pain worst affected areas in both heels and in mid feet. Plantar fasciitis exercises mildly helpful for the bottom of the feet but not much. 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    Previous HPI 05/20/21 Dominique [name] is a 47 y.o. 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 So far her symptoms are about the same. She continues having pain worst in her feet and less extent along medial side of knees. She does not see much swelling or discoloration just pain. Has not noticed any side effects of the methotrexate.   Previous HPI 01/23/21 Dominique [name] is a 48 y.o. female here for evaluation of positive ANA and elevated sedimentation rate with multiple systemic symptoms including weight loss, lymphadenopathy, paresthesias, joint pain, and headaches.  She started noticing hyperpigmented skin rashes developing on her face and extremities since several months ago does not recall specific onset or provoking episode.  Then developed worsening joint pain in multiple sites but started around July of this year.  This involved pain affecting both hands also the hips knees and feet bilaterally.  She described morning stiffness lasting 2 or 3 hours in duration also especially pain in the bottoms of the feet first and getting out of bed.  After a month with these ongoing and somewhat worsening symptoms she was seen in primary care clinic. Workup at that time positive for trichomoniasis and syphilis and starting antibiotics also found to have high sedimentation rate with positive ANA and CCP Abs. She followed up for continued treatment last month and more recently in ID clinic with completion of antibiotic treatment for syphilis with Bicillin.  Neurology evaluation with lumbar puncture that was not suggestive for active neurosyphilis. She saw ophthalmology for visual change symptoms reports findings were not concerning for infectious or inflammatory type changes.   No Rheumatology ROS completed.   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   Right hip pain 05/16/2020   BRBPR (bright red blood per rectum) 10/18/2019   Tobacco use disorder 07/31/2019   Dyspnea, paroxysmal nocturnal 07/31/2019   Schizophrenia (HCC) 07/31/2019   Encounter for screening examination for sexually transmitted disease 07/31/2019   Abnormal uterine bleeding (AUB) 05/09/2013   BV (bacterial vaginosis) 05/09/2013   Dysmenorrhea 05/09/2013    Past Medical History:  Diagnosis Date   Aneurysm (HCC)    Arthritis    RA   Kidney stones    Lupus 01/17/2021   Lupus  Schizophrenia (HCC)    Systemic lupus erythematosus (HCC)     Family History  Problem Relation Age of Onset   Stroke Mother    Multiple sclerosis Father    Hypertension Father    Diabetes Father    Breast cancer Maternal Aunt        unknown age   Dementia Maternal Grandfather    Asthma Brother    Past Surgical History:  Procedure Laterality Date   BREAST BIOPSY Right 09/29/2022   [place] RT BREAST BX W LOC DEV 1ST LESION IMG BX SPEC US GUIDE 09/29/2022 [facility]MOGR[facility]   CESAREAN SECTION     HERNIA REPAIR     IR 3D INDEPENDENT WKST  07/28/2021   IR ANGIO INTRA EXTRACRAN SEL INTERNAL CAROTID BILAT MOD SED  07/28/2021   IR ANGIO VERTEBRAL SEL VERTEBRAL UNI L MOD SED  07/28/2021   IR ANGIOGRAM FOLLOW UP STUDY  07/28/2021   IR CT HEAD LTD  07/28/2021   IR NEURO EACH ADD'L AFTER BASIC UNI LEFT (MS)  07/28/2021   IR RADIOLOGIST EVAL & MGMT  07/10/2021   IR RADIOLOGIST EVAL & MGMT  08/15/2021   IR TRANSCATH/EMBOLIZ  07/28/2021   IR US GUIDE VASC ACCESS RIGHT  07/28/2021   RADIOLOGY WITH ANESTHESIA N/A 07/28/2021    Procedure: EMBOLIZATION;  Surgeon: Julieanne Cotton, MD;  Location: [facility] OR;  Service: Radiology;  Laterality: N/A;   Social History   Social History Narrative   Right handed   Immunization History  Administered Date(s) Administered   Hepatitis A, Adult 08/26/2022, 03/03/2023   Hepb-cpg 06/25/2021, 09/02/2021   Influenza, Seasonal, Injecte, Preservative Fre 12/31/2022   Influenza,inj,Quad PF,6+ Mos 01/30/2021, 12/31/2021   Tdap 02/25/2022     Objective: Vital Signs: There were no vitals taken for this visit.   Physical Exam   Musculoskeletal Exam: ***  CDAI Exam: CDAI Score: -- Patient Global: --; Provider Global: -- Swollen: --; Tender: -- Joint Exam 06/28/2023   No joint exam has been documented for this visit   There is currently no information documented on the homunculus. Go to the Rheumatology activity and complete the homunculus joint exam.  Investigation: No additional findings.  Imaging: No results found.  Recent Labs: Lab Results  Component Value Date   WBC 7.5 05/24/2023   HGB 10.5 (L) 05/24/2023   PLT 189 05/24/2023   NA 141 05/24/2023   K 4.1 05/24/2023   CL 110 05/24/2023   CO2 26 05/24/2023   GLUCOSE 102 (H) 05/24/2023   BUN 11 05/24/2023   CREATININE 0.77 05/24/2023   BILITOT 0.4 05/24/2023   ALKPHOS 58 12/23/2022   AST 19 05/24/2023   ALT 14 05/24/2023   PROT 6.4 05/24/2023   ALBUMIN 4.0 12/23/2022   CALCIUM 9.0 05/24/2023   GFRAA 117 05/20/2020    Speciality Comments: PLQ Eye Exam: 04/09/2022 WNL  @[facility] f/u 3-4 weeks  Was advised patient has not been seen at [facility] since January because she no showed at least four appointments.   Procedures:  No procedures performed Allergies: Other   Assessment / Plan:     Visit Diagnoses: No diagnosis found.  ***  Orders: No orders of the defined types were placed in this encounter.  No orders of the defined types were placed in this encounter.    Follow-Up Instructions:  No follow-ups on file.   Metta Clines, RT  Note - This record has been created using [employer].  Chart creation errors have been sought,  but may not always  have been located. Such creation errors do not reflect on  the standard of medical care.

## 2023-06-17 ENCOUNTER — Other Ambulatory Visit (HOSPITAL_COMMUNITY): Payer: Self-pay

## 2023-06-17 ENCOUNTER — Other Ambulatory Visit: Payer: Self-pay

## 2023-06-17 NOTE — Progress Notes (Signed)
 Specialty Pharmacy Refill Coordination Note  Dominique [name] is a 48 y.o. female assessed today regarding refills of clinic administered specialty medication(s) Cabotegravir (Apretude)   Clinic requested Courier to Provider Office   Delivery date: 06/23/23   Verified address: [address] [place] [address]   Medication will be filled on 06/22/23.

## 2023-06-22 ENCOUNTER — Other Ambulatory Visit: Payer: Self-pay

## 2023-06-23 ENCOUNTER — Telehealth: Payer: Self-pay

## 2023-06-23 NOTE — Telephone Encounter (Signed)
 [facility] Patient Advocate Encounter  Patient's medications APRETUDE have been couriered to [facility] from [facility] and will be administered at the patients appointment on 06/29/23.  Kae Heller, CPhT Specialty Pharmacy Patient [facility] Phone: [phone] Fax:  [phone]

## 2023-06-28 ENCOUNTER — Ambulatory Visit: Payer: Self-pay | Admitting: Internal Medicine

## 2023-06-28 DIAGNOSIS — M0600 Rheumatoid arthritis without rheumatoid factor, unspecified site: Secondary | ICD-10-CM

## 2023-06-28 DIAGNOSIS — Z79899 Other long term (current) drug therapy: Secondary | ICD-10-CM

## 2023-06-28 DIAGNOSIS — I671 Cerebral aneurysm, nonruptured: Secondary | ICD-10-CM

## 2023-06-28 NOTE — Progress Notes (Unsigned)
 HPI: Dominique [name] is a 48 y.o. female who presents to the [facility] pharmacy clinic for Apretude administration and HIV PrEP follow up.  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   CAPSAICIN (ZOSTRIX) 0.025 % CREAM    Apply 1 application. topically 3 (three) times daily.   CETIRIZINE (ZYRTEC) 10 MG TABLET    Take 10 mg by mouth daily.   CITALOPRAM (CELEXA) 20 MG TABLET    Take 1 tablet by mouth daily.   CLOPIDOGREL (PLAVIX) 75 MG TABLET    Take 0.5 tablets (37.5 mg total) by mouth daily.   DEPO-SUBQ PROVERA 104 104 MG/0.65ML INJECTION    every 3 (three) months.   DICLOFENAC SODIUM (VOLTAREN) 1 % GEL    Apply 2 g topically 4 (four) times daily.   FAMOTIDINE (PEPCID) 20 MG TABLET    Take 20 mg by mouth at bedtime.   FLUTICASONE (FLONASE) 50 MCG/ACT NASAL SPRAY    Place into the nose.    FOLIC ACID (FOLVITE) 1 MG TABLET    Take 1 tablet (1 mg total) by mouth daily.   FOLIC ACID (FOLVITE) 1 MG TABLET    Take 1 tablet by mouth daily.   GABAPENTIN (NEURONTIN) 600 MG TABLET    Take 600 mg by mouth 3 (three) times daily.   HYDROXYCHLOROQUINE (PLAQUENIL) 200 MG TABLET    TAKE 1 TABLET(200 MG) BY MOUTH DAILY   MEDROXYPROGESTERONE (DEPO-SUBQ PROVERA 104) 104 MG/0.65ML INJECTION    Inject into the skin.   MELATONIN (MELATONIN MAXIMUM STRENGTH) 5 MG TABS    Take by mouth.   MELOXICAM (MOBIC) 15 MG TABLET    Take 1 tablet (15 mg total) by mouth daily.   MISC. DEVICES (ROLLATOR ULTRA-LIGHT) MISC    Rollator/Rolling Walker with seat, use for balance and support with ambulation   NORETHINDRONE (MICRONOR) 0.35 MG TABLET    Take 1 tablet by mouth daily.   NORTRIPTYLINE (PAMELOR) 25 MG CAPSULE    TAKE 1 CAPSULE(25 MG) BY MOUTH AT BEDTIME   PREGABALIN (LYRICA) 100 MG CAPSULE    Take by mouth.   PREGABALIN (LYRICA) 100 MG CAPSULE    Take 100 mg by mouth 3 (three) times daily.   QUETIAPINE (SEROQUEL) 100 MG TABLET    Take 100 mg by mouth daily.   SULFASALAZINE (AZULFIDINE) 500 MG EC TABLET    Take 1  tablet (500 mg total) by mouth 2 (two) times daily.   TIZANIDINE (ZANAFLEX) 4 MG TABLET    Take 4 mg by mouth 2 (two) times daily.   VALACYCLOVIR (VALTREX) 500 MG TABLET    Take 500 mg by mouth daily.   VITAMIN D, ERGOCALCIFEROL, (DRISDOL) 1.25 MG (50000 UNIT) CAPS CAPSULE    Take 50,000 Units by mouth once a week.  Modified Medications   No medications on file  Discontinued Medications   No medications on file    Allergies: Allergies  Allergen Reactions   Other     Labs: Lab Results  Component Value Date   HIV1RNAQUANT Not Detected 05/11/2023   HIV1RNAQUANT Not Detected 03/03/2023   HIV1RNAQUANT Not Detected 12/31/2022    RPR and STI Lab Results  Component Value Date   LABRPR REACTIVE (A) 03/03/2023   LABRPR REACTIVE (A) 08/26/2022   LABRPR REACTIVE (A) 06/24/2022   LABRPR  REACTIVE (A) 04/29/2022   LABRPR REACTIVE (A) 06/25/2021   RPRTITER 1:1 (H) 03/03/2023   RPRTITER 1:2 (H) 08/26/2022   RPRTITER 1:2 (H) 06/24/2022   RPRTITER 1:2 (H) 04/29/2022   RPRTITER 1:8 (H) 06/25/2021    STI Results GC GC CT CT  05/11/2023 11:01 AM Negative    Negative   Negative    Negative    03/03/2023  9:41 AM Negative    Negative   Negative    Negative    08/26/2022  9:14 AM Negative    Negative   Negative    Negative    06/24/2022 10:42 AM Negative    Negative   Negative    Negative    04/29/2022 10:00 AM Negative   Negative    10/31/2021  9:06 AM Negative   Negative    09/02/2021  9:56 AM Negative   Negative    06/25/2021  8:33 AM Negative   Negative    01/30/2021 10:14 AM Negative   Negative    10/16/2019  3:39 PM Negative   Negative    05/09/2013 10:57 AM  NEGATIVE   NEGATIVE   12/07/2011 12:20 PM   NEGATIVE      Hepatitis B Lab Results  Component Value Date   HEPBSAB REACTIVE (A) 12/31/2021   HEPBSAG NON-REACTIVE 12/18/2020   HEPBCAB NON-REACTIVE 12/18/2020   Hepatitis C Lab Results  Component Value Date   HEPCAB NON-REACTIVE 12/18/2020   Hepatitis A Lab Results  Component Value Date   HAV REACTIVE (A) 05/11/2023   Lipids: Lab Results  Component Value Date   CHOL 154 07/28/2019   TRIG 96 07/28/2019   HDL 57 07/28/2019   CHOLHDL 2.7 07/28/2019   LDLCALC 79 07/28/2019    TARGET DATE: The 3rd  Assessment: Dominique [name] presents today for her Apretude injection and to follow up for HIV PrEP. No issues with past injections. Denies any symptoms of acute HIV. Last STI screening was on 05/11/23 and was negative. No known exposures to any STIs since last visit and declines testing today.  Per [employer] guidelines, a rapid HIV test should be drawn prior to Apretude administration. Due to state shortage of rapid HIV tests, this is temporarily unable to be done. Per decision from [facility] physicians, we will proceed with Apretude administration at this time  without a negative rapid HIV test beforehand. HIV RNA was collected today and is in process.  Administered cabotegravir 600mg /4mL in left upper outer quadrant of the gluteal muscle. Will see her back in 2 months for injection, labs, and HIV  PrEP follow up.  Plan: - Apretude injection administered - HIV RNA today - Next injection, labs, and PrEP follow up appointment scheduled for 08/30/23 and 11/01/23 - Call with any issues or questions  Janalynn Eder L. Burl Tauzin, PharmD, BCIDP, AAHIVP, CPP Clinical Pharmacist Practitioner - Infectious Diseases Clinical Pharmacist Lead - Specialty Pharmacy [facility] 06/28/2023, 2:54 PM

## 2023-06-29 ENCOUNTER — Other Ambulatory Visit: Payer: Self-pay

## 2023-06-29 ENCOUNTER — Ambulatory Visit (INDEPENDENT_AMBULATORY_CARE_PROVIDER_SITE_OTHER): Payer: 59 | Admitting: Pharmacist

## 2023-06-29 DIAGNOSIS — Z79899 Other long term (current) drug therapy: Secondary | ICD-10-CM

## 2023-06-29 DIAGNOSIS — Z2981 Encounter for HIV pre-exposure prophylaxis: Secondary | ICD-10-CM

## 2023-06-29 DIAGNOSIS — Z113 Encounter for screening for infections with a predominantly sexual mode of transmission: Secondary | ICD-10-CM

## 2023-06-29 MED ORDER — CABOTEGRAVIR ER 600 MG/3ML IM SUER
600.0000 mg | Freq: Once | INTRAMUSCULAR | Status: AC
  Administered 2023-06-29: 600 mg via INTRAMUSCULAR

## 2023-06-30 ENCOUNTER — Encounter: Payer: Self-pay | Admitting: Physician Assistant

## 2023-06-30 ENCOUNTER — Ambulatory Visit (INDEPENDENT_AMBULATORY_CARE_PROVIDER_SITE_OTHER): Admitting: Physician Assistant

## 2023-06-30 ENCOUNTER — Other Ambulatory Visit (INDEPENDENT_AMBULATORY_CARE_PROVIDER_SITE_OTHER)

## 2023-06-30 DIAGNOSIS — M25572 Pain in left ankle and joints of left foot: Secondary | ICD-10-CM | POA: Diagnosis not present

## 2023-06-30 DIAGNOSIS — M25571 Pain in right ankle and joints of right foot: Secondary | ICD-10-CM | POA: Diagnosis not present

## 2023-06-30 NOTE — Progress Notes (Signed)
 Office Visit Note   Patient: Dominique [name]           Date of Birth: Aug 11, 1975           MRN: [id number] Visit Date: 06/30/2023              Requested by: Dot Been, FNP [address] [place],  [place] [address] PCP: Dot Been, FNP   Assessment & Plan: Visit Diagnoses:  1. Pain in left ankle and joints of left foot   2. Pain in right ankle and joints of right foot     Plan: Patient is a pleasant 48 year old woman with a history of lupus and rheumatoid arthritis.  She does followed by Dr. Dimple Casey.  She comes in specifically more today for ankle and more bottom of her heel pain.  She denies any injuries.  She usually just wears her bedroom shoes at home.  She notices the pain is particularly bad when she first gets up in the morning and she feels like she is going to fall.  X-rays are benign.  While this certainly could be from her autoimmune issues she does says some findings consistent with plantar fasciitis.  I will give her some inserts as well as a good stretching program.  Gave her lots of ideas on how to ice.  She will follow-up if she still continues to have problems  Follow-Up Instructions: Return if symptoms worsen or fail to improve.   Orders:  Orders Placed This Encounter  Procedures   XR Ankle 2 Views Right   XR Ankle 2 Views Left   No orders of the defined types were placed in this encounter.     Procedures: No procedures performed   Clinical Data: No additional findings.   Subjective: Chief Complaint  Patient presents with   Leg Pain    Patient in today was referred by her PC because of the paint in there knees legs and ankles  Patient has Lupus     Patient is a pleasant 48 year old woman with a history of lupus and rheumatoid arthritis.  She is followed by Dr. Dimple Casey.  She has polyarthralgia at baseline but today she comes in with a 1 week history of bilateral ankle pain.  No recent injury no change in activity she has not noticed any  swelling  Review of Systems  All other systems reviewed and are negative.    Objective: Vital Signs: There were no vitals taken for this visit.  Physical Exam Constitutional:      Appearance: Normal appearance.  Pulmonary:     Effort: Pulmonary effort is normal.  Skin:    General: Skin is warm and dry.  Neurological:     Mental Status: She is alert.  Psychiatric:        Mood and Affect: Mood normal.        Behavior: Behavior normal.     Ortho Exam Bilateral feet and ankle no redness no erythema compartments are soft and nontender she has good palpable pulses she has some tenderness in the ankle but mostly her tenderness especially with Achilles stretching is on the bottom of her heel.  She has some bilateral tenderness to palpation no midfoot pain or swelling Specialty Comments:  No specialty comments available.  Imaging: XR Ankle 2 Views Right Result Date: 06/30/2023 2 views of the right ankle no significant degenerative changes fractures and well-maintained alignment  XR Ankle 2 Views Left Result Date: 06/30/2023 Radiographs of the left  ankle well-maintained alignment no significant degenerative changes    PMFS History: Patient Active Problem List   Diagnosis Date Noted   Iron deficiency anemia 12/24/2022   Subclinical hyperthyroidism 12/24/2022   Seronegative rheumatoid arthritis (HCC) 12/13/2021   Brain aneurysm 07/28/2021   Bilateral ankle pain 05/20/2021   High risk medication use 04/21/2021   Herpes 01/23/2021   Polyarthralgia 01/23/2021   Rash and other nonspecific skin eruption 01/23/2021   Lupus 01/17/2021   Neurosyphilis in adult 12/18/2020   Blurry vision, bilateral 12/18/2020   Smoking 12/18/2020   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   Right hip pain 05/16/2020   BRBPR (bright red blood per rectum) 10/18/2019   Tobacco use disorder 07/31/2019   Dyspnea, paroxysmal nocturnal 07/31/2019   Schizophrenia (HCC) 07/31/2019   Encounter for screening  examination for sexually transmitted disease 07/31/2019   Abnormal uterine bleeding (AUB) 05/09/2013   BV (bacterial vaginosis) 05/09/2013   Dysmenorrhea 05/09/2013   Past Medical History:  Diagnosis Date   Aneurysm (HCC)    Arthritis    RA   Kidney stones    Lupus 01/17/2021   Lupus    Schizophrenia (HCC)    Systemic lupus erythematosus (HCC)     Family History  Problem Relation Age of Onset   Stroke Mother    Multiple sclerosis Father    Hypertension Father    Diabetes Father    Breast cancer Maternal Aunt        unknown age   Dementia Maternal Grandfather    Asthma Brother     Past Surgical History:  Procedure Laterality Date   BREAST BIOPSY Right 09/29/2022   [place] RT BREAST BX W LOC DEV 1ST LESION IMG BX SPEC US GUIDE 09/29/2022 [facility]   CESAREAN SECTION     HERNIA REPAIR     IR 3D INDEPENDENT WKST  07/28/2021   IR ANGIO INTRA EXTRACRAN SEL INTERNAL CAROTID BILAT MOD SED  07/28/2021   IR ANGIO VERTEBRAL SEL VERTEBRAL UNI L MOD SED  07/28/2021   IR ANGIOGRAM FOLLOW UP STUDY  07/28/2021   IR CT HEAD LTD  07/28/2021   IR NEURO EACH ADD'L AFTER BASIC UNI LEFT (MS)  07/28/2021   IR RADIOLOGIST EVAL & MGMT  07/10/2021   IR RADIOLOGIST EVAL & MGMT  08/15/2021   IR TRANSCATH/EMBOLIZ  07/28/2021   IR US GUIDE VASC ACCESS RIGHT  07/28/2021   RADIOLOGY WITH ANESTHESIA N/A 07/28/2021   Procedure: EMBOLIZATION;  Surgeon: Julieanne Cotton, MD;  Location: [facility] OR;  Service: Radiology;  Laterality: N/A;   Social History   Occupational History   Not on file  Tobacco Use   Smoking status: Former    Current packs/day: 0.00    Average packs/day: 0.3 packs/day for 15.0 years (3.8 ttl pk-yrs)    Types: Cigarettes    Start date: 07/16/2006    Quit date: 07/15/2021    Years since quitting: 1.9    Passive exposure: Past   Smokeless tobacco: Never  Vaping Use   Vaping status: Never Used  Substance and Sexual Activity   Alcohol use: Not Currently   Drug use: Not Currently    Types:  Marijuana    Comment: Last use was on 07/15/21   Sexual activity: Yes    Partners: Male    Birth control/protection: Surgical, Injection

## 2023-07-01 ENCOUNTER — Ambulatory Visit: Admitting: Podiatry

## 2023-07-01 DIAGNOSIS — M778 Other enthesopathies, not elsewhere classified: Secondary | ICD-10-CM

## 2023-07-01 LAB — HIV-1 RNA QUANT-NO REFLEX-BLD
HIV 1 RNA Quant: NOT DETECTED {copies}/mL
HIV-1 RNA Quant, Log: NOT DETECTED {Log_copies}/mL

## 2023-07-08 ENCOUNTER — Ambulatory Visit: Admitting: Podiatry

## 2023-07-08 DIAGNOSIS — M778 Other enthesopathies, not elsewhere classified: Secondary | ICD-10-CM

## 2023-07-14 NOTE — Progress Notes (Deleted)
 Office Visit Note  Patient: Dominique [name]             Date of Birth: [birth year]           MRN: [id number]             PCP: Dot Been, FNP Referring: Dot Been, FNP Visit Date: 07/23/2023   Subjective:  No chief complaint on file.   History of Present Illness: Dominique [name] is a 48 y.o. female here for follow up for follow up for seronegative RA on hydroxychloroquine 200 mg daily and sulfasalazine 500 mg twice daily.    Previous HPI 11/02/2022 Dominique [name] is a 48 y.o. 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  Since her last visit she has not had any major exacerbation of symptoms.  Most problematic joint still in bilateral ankles mostly pain with weightbearing.  Does not see a lot of associated swelling occasionally some that last less than 1 day duration.  Still with discolored skin rashes mostly on the face not itchy or painful.  She is noticing more bruising on her lower legs does not recall any preceding trauma to the sites.  No bruising on the upper part of her body.  She is scheduled for follow-up 50-month MRI for monitoring of aneurysm but with no new symptom complaints.   Previous HPI 07/15/2022 Dominique [name] is a 48 y.o. 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  Since her last visit she saw physical therapy completed 1 session with them so far is scheduled to follow-up planning to do some pool or other aquatic therapy.  She switched to Lyrica with pain management so far finding this more effective than the gabapentin for ongoing symptoms.  She had mildly low potassium checked in previous clinic visit was concerned if this contributed to muscle cramping issues but was improved back to normal range upon recheck.  Still getting some stiffness and soreness most noticeably at the feet and ankles and some  lasting through the day but no major exacerbation.  Not seeing much visible swelling or rashes.   Previous HPI 04/15/22 Dominique [name] is a 48 y.o. female here for follow up for seronegative RA (ANA+) on hydroxychloroquine 200 mg daily and sulfasalazine 1000 mg twice.  No major flareups since her last visit.  Hyperpigmented skin changes mostly around the face may be the same or slightly worse.  Still with persistent foot and ankle pain.   Previous HPI 12/10/21 Dominique [name] is a 48 y.o. 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  Most symptoms are stable main complaint today is bilateral foot and ankle pain and swelling. No obvious difference off of HCQ. Pain is throughout the day describes as stabbing with pins type of sensation. She is seeing swelling accumulate usually by the evening and worse when sitting or standing in fixed position for a long time.    Previous HPI 09/12/2021 Dominique [name] is a 48 y.o. 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 She feels symptoms partially improved with less knee pain but continues to have every day pain in bilateral feet and ankles. Pain is first thing in the morning as well as worsening if she  is on her feet all day. Feet swelling is noticeable with prolonged standing worse later in the day. She had coil embolization procedure uneventfully. She has noticed some progression of facial rash.   Previous HPI 07/21/21 Dominique [name] is a 48 y.o. 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 So far she still has about the same amount of pain worst affected areas in both heels and in mid feet. Plantar fasciitis exercises mildly helpful for the bottom of the feet but not much. 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    Previous HPI 05/20/21 Dominique [name]  is a 48 y.o. 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 So far her symptoms are about the same. She continues having pain worst in her feet and less extent along medial side of knees. She does not see much swelling or discoloration just pain. Has not noticed any side effects of the methotrexate.   Previous HPI 01/23/21 Dominique [name] is a 48 y.o. female here for evaluation of positive ANA and elevated sedimentation rate with multiple systemic symptoms including weight loss, lymphadenopathy, paresthesias, joint pain, and headaches.  She started noticing hyperpigmented skin rashes developing on her face and extremities since several months ago does not recall specific onset or provoking episode.  Then developed worsening joint pain in multiple sites but started around July of this year.  This involved pain affecting both hands also the hips knees and feet bilaterally.  She described morning stiffness lasting 2 or 3 hours in duration also especially pain in the bottoms of the feet first and getting out of bed.  After a month with these ongoing and somewhat worsening symptoms she was seen in primary care clinic. Workup at that time positive for trichomoniasis and syphilis and starting antibiotics also found to have high sedimentation rate with positive ANA and CCP Abs. She followed up for continued treatment last month and more recently in ID clinic with completion of antibiotic treatment for syphilis with Bicillin.  Neurology evaluation with lumbar puncture that was not suggestive for active neurosyphilis. She saw ophthalmology for visual change symptoms reports findings were not concerning for infectious or inflammatory type changes.   No Rheumatology ROS completed.   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   Right hip pain 05/16/2020   BRBPR (bright red blood per rectum) 10/18/2019   Tobacco use disorder 07/31/2019   Dyspnea, paroxysmal nocturnal 07/31/2019   Schizophrenia (HCC) 07/31/2019   Encounter for screening examination for sexually transmitted disease 07/31/2019   Abnormal uterine bleeding (AUB) 05/09/2013   BV (bacterial vaginosis) 05/09/2013   Dysmenorrhea 05/09/2013    Past Medical History:  Diagnosis Date   Aneurysm (HCC)    Arthritis    RA   Kidney stones    Lupus 01/17/2021  Lupus    Schizophrenia (HCC)    Systemic lupus erythematosus (HCC)     Family History  Problem Relation Age of Onset   Stroke Mother    Multiple sclerosis Father    Hypertension Father    Diabetes Father    Breast cancer Maternal Aunt        unknown age   Dementia Maternal Grandfather    Asthma Brother    Past Surgical History:  Procedure Laterality Date   BREAST BIOPSY Right 09/29/2022   [place] RT BREAST BX W LOC DEV 1ST LESION IMG BX SPEC US GUIDE 09/29/2022 [facility]MO[facility]   CESAREAN SECTION     HERNIA REPAIR     IR 3D INDEPENDENT WKST  07/28/2021   IR ANGIO INTRA EXTRACRAN SEL INTERNAL CAROTID BILAT MOD SED  07/28/2021   IR ANGIO VERTEBRAL SEL VERTEBRAL UNI L MOD SED  07/28/2021   IR ANGIOGRAM FOLLOW UP STUDY  07/28/2021   IR CT HEAD LTD  07/28/2021   IR NEURO EACH ADD'L AFTER BASIC UNI LEFT (MS)  07/28/2021   IR RADIOLOGIST EVAL & MGMT  07/10/2021   IR RADIOLOGIST EVAL & MGMT  08/15/2021   IR TRANSCATH/EMBOLIZ  07/28/2021   IR US GUIDE VASC ACCESS RIGHT  07/28/2021   RADIOLOGY WITH ANESTHESIA N/A  07/28/2021   Procedure: EMBOLIZATION;  Surgeon: Julieanne Cotton, MD;  Location: [facility];  Service: Radiology;  Laterality: N/A;   Social History   Social History Narrative   Right handed   Immunization History  Administered Date(s) Administered   Hepatitis A, Adult 08/26/2022, 03/03/2023   Hepb-cpg 06/25/2021, 09/02/2021   Influenza, Seasonal, Injecte, Preservative Fre 12/31/2022   Influenza,inj,Quad PF,6+ Mos 01/30/2021, 12/31/2021   Tdap 02/25/2022     Objective: Vital Signs: There were no vitals taken for this visit.   Physical Exam   Musculoskeletal Exam: ***  CDAI Exam: CDAI Score: -- Patient Global: --; Provider Global: -- Swollen: --; Tender: -- Joint Exam 07/23/2023   No joint exam has been documented for this visit   There is currently no information documented on the homunculus. Go to the Rheumatology activity and complete the homunculus joint exam.  Investigation: No additional findings.  Imaging: XR Ankle 2 Views Right Result Date: 06/30/2023 2 views of the right ankle no significant degenerative changes fractures and well-maintained alignment  XR Ankle 2 Views Left Result Date: 06/30/2023 Radiographs of the left ankle well-maintained alignment no significant degenerative changes   Recent Labs: Lab Results  Component Value Date   WBC 7.5 05/24/2023   HGB 10.5 (L) 05/24/2023   PLT 189 05/24/2023   NA 141 05/24/2023   K 4.1 05/24/2023   CL 110 05/24/2023   CO2 26 05/24/2023   GLUCOSE 102 (H) 05/24/2023   BUN 11 05/24/2023   CREATININE 0.77 05/24/2023   BILITOT 0.4 05/24/2023   ALKPHOS 58 12/23/2022   AST 19 05/24/2023   ALT 14 05/24/2023   PROT 6.4 05/24/2023   ALBUMIN 4.0 12/23/2022   CALCIUM 9.0 05/24/2023   GFRAA 117 05/20/2020    Speciality Comments: PLQ Eye Exam: 04/09/2022 WNL  @[facility] f/u 3-4 weeks  Was advised patient has not been seen at [facility] since January because she no showed at least four appointments.   Procedures:   No procedures performed Allergies: Other   Assessment / Plan:     Visit Diagnoses: No diagnosis found.  ***  Orders: No orders of the defined types were placed in this encounter.  No orders of the defined types were placed in this encounter.    Follow-Up Instructions: No follow-ups on file.   Glena Landau, RT  Note - This record has been created using [employer].  Chart creation errors have been sought, but may not always  have been located. Such creation errors do not reflect on  the standard of medical care.

## 2023-07-20 ENCOUNTER — Other Ambulatory Visit: Payer: Self-pay

## 2023-07-20 DIAGNOSIS — R768 Other specified abnormal immunological findings in serum: Secondary | ICD-10-CM

## 2023-07-20 DIAGNOSIS — R21 Rash and other nonspecific skin eruption: Secondary | ICD-10-CM

## 2023-07-20 DIAGNOSIS — M2550 Pain in unspecified joint: Secondary | ICD-10-CM

## 2023-07-20 NOTE — Telephone Encounter (Signed)
 Refill request received via fax from [facility] for Plaquenil   Last Fill: 05/17/2023  Eye exam: 04/09/2022 WNL   Labs: 06/22/2023  Glucose 68 RBC Count 3.69  Next Visit: 07/23/2023  Last Visit: 11/02/2022  [id number] rheumatoid arthritis (HCC)   Current Dose per office note 11/02/2022: hydroxychloroquine  200 mg daily   Contacted the patient and patient states she would like to fill her Hydroxychloroquine  through [facility] [place] pharmacy now. Inquired if the patient has had an updated PLQ eye exam yet. Patient states she is still looking for a provider in network because she changed insurances. Patient states she will contact the office when she schedules her eye exam.   Okay to refill Plaquenil ?

## 2023-07-23 ENCOUNTER — Ambulatory Visit: Admitting: Internal Medicine

## 2023-07-23 DIAGNOSIS — Z79899 Other long term (current) drug therapy: Secondary | ICD-10-CM

## 2023-07-23 DIAGNOSIS — M0600 Rheumatoid arthritis without rheumatoid factor, unspecified site: Secondary | ICD-10-CM

## 2023-07-23 DIAGNOSIS — I671 Cerebral aneurysm, nonruptured: Secondary | ICD-10-CM

## 2023-07-23 MED ORDER — HYDROXYCHLOROQUINE SULFATE 200 MG PO TABS: ORAL_TABLET | ORAL | 0 refills | Status: DC

## 2023-07-27 ENCOUNTER — Other Ambulatory Visit: Payer: Self-pay | Admitting: Internal Medicine

## 2023-07-28 ENCOUNTER — Other Ambulatory Visit: Payer: Self-pay | Admitting: Internal Medicine

## 2023-07-28 DIAGNOSIS — R768 Other specified abnormal immunological findings in serum: Secondary | ICD-10-CM

## 2023-07-28 DIAGNOSIS — M2550 Pain in unspecified joint: Secondary | ICD-10-CM

## 2023-07-28 DIAGNOSIS — R21 Rash and other nonspecific skin eruption: Secondary | ICD-10-CM

## 2023-07-28 NOTE — Telephone Encounter (Signed)
 Last Fill: 05/21/2023  Labs: 06/22/2023 RBC Count 3.69 Glucose 68  Next Visit: 08/13/2023  Last Visit: 11/02/2022  DX: Seronegative rheumatoid arthritis (HCC)   Current Dose per office note 11/02/2022: sulfasalazine  1000 mg twice daily.   Dose decreased per lab results on 11/02/2022: I recommend decreasing the dose by half for now and we could recheck her labs (Hgb, LFTs) after 1 month of reducing this.   Okay to refill Sulfasalazine ?

## 2023-07-30 ENCOUNTER — Other Ambulatory Visit: Payer: Self-pay | Admitting: Family Medicine

## 2023-07-30 ENCOUNTER — Other Ambulatory Visit: Payer: Self-pay | Admitting: Internal Medicine

## 2023-07-30 DIAGNOSIS — R21 Rash and other nonspecific skin eruption: Secondary | ICD-10-CM

## 2023-07-30 DIAGNOSIS — M2550 Pain in unspecified joint: Secondary | ICD-10-CM

## 2023-07-30 DIAGNOSIS — R768 Other specified abnormal immunological findings in serum: Secondary | ICD-10-CM

## 2023-08-05 NOTE — Progress Notes (Deleted)
 Office Visit Note  Patient: Dominique [name]             Date of Birth: 1975/12/08           MRN: [id number]             PCP: Harry Lindau, FNP Referring: Harry Lindau, FNP Visit Date: 08/13/2023   Subjective:  No chief complaint on file.   History of Present Illness: Dominique [name] is a 48 y.o. 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    Previous HPI 11/02/2022 Dominique [name] is a 47 y.o. 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  Since her last visit she has not had any major exacerbation of symptoms.  Most problematic joint still in bilateral ankles mostly pain with weightbearing.  Does not see a lot of associated swelling occasionally some that last less than 1 day duration.  Still with discolored skin rashes mostly on the face not itchy or painful.  She is noticing more bruising on her lower legs does not recall any preceding trauma to the sites.  No bruising on the upper part of her body.  She is scheduled for follow-up 10-month MRI for monitoring of aneurysm but with no new symptom complaints.   Previous HPI 07/15/2022 Dominique [name] is a 48 y.o. 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  Since her last visit she saw physical therapy completed 1 session with them so far is scheduled to follow-up planning to do some pool or other aquatic therapy.  She switched to Lyrica with pain management so far finding this more effective than the gabapentin  for ongoing symptoms.  She had mildly low potassium checked in previous clinic visit was concerned if this contributed to muscle cramping issues but was improved back to normal range upon recheck.  Still getting some stiffness and soreness most noticeably at the feet and ankles and some  lasting through the day but no major exacerbation.  Not seeing much visible swelling or rashes.   Previous HPI 04/15/22 Dominique [name] is a 48 y.o. female here for follow up for seronegative RA (ANA+) on hydroxychloroquine  200 mg daily and sulfasalazine  1000 mg twice.  No major flareups since her last visit.  Hyperpigmented skin changes mostly around the face may be the same or slightly worse.  Still with persistent foot and ankle pain.   Previous HPI 12/10/21 Dominique [name] is a 48 y.o. 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  Most symptoms are stable main complaint today is bilateral foot and ankle pain and swelling. No obvious difference off of HCQ. Pain is throughout the day describes as stabbing with pins type of sensation. She is seeing swelling accumulate usually by the evening and worse when sitting or standing in fixed position for a long time.    Previous HPI 09/12/2021 ROMAISA [name] is a 48 y.o. 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 She feels symptoms partially improved with less knee pain but continues to have every day pain in bilateral feet and ankles. Pain is first thing in the morning as well as worsening if she  is on her feet all day. Feet swelling is noticeable with prolonged standing worse later in the day. She had coil embolization procedure uneventfully. She has noticed some progression of facial rash.   Previous HPI 07/21/21 Dominique DELCOUR is a 48 y.o. 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 So far she still has about the same amount of pain worst affected areas in both heels and in mid feet. Plantar fasciitis exercises mildly helpful for the bottom of the feet but not much. 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    Previous HPI 05/20/21 Dominique [name]  is a 48 y.o. 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 So far her symptoms are about the same. She continues having pain worst in her feet and less extent along medial side of knees. She does not see much swelling or discoloration just pain. Has not noticed any side effects of the methotrexate .   Previous HPI 01/23/21 Dominique [name] is a 48 y.o. female here for evaluation of positive ANA and elevated sedimentation rate with multiple systemic symptoms including weight loss, lymphadenopathy, paresthesias, joint pain, and headaches.  She started noticing hyperpigmented skin rashes developing on her face and extremities since several months ago does not recall specific onset or provoking episode.  Then developed worsening joint pain in multiple sites but started around July of this year.  This involved pain affecting both hands also the hips knees and feet bilaterally.  She described morning stiffness lasting 2 or 3 hours in duration also especially pain in the bottoms of the feet first and getting out of bed.  After a month with these ongoing and somewhat worsening symptoms she was seen in primary care clinic. Workup at that time positive for trichomoniasis and syphilis and starting antibiotics also found to have high sedimentation rate with positive ANA and CCP Abs. She followed up for continued treatment last month and more recently in ID clinic with completion of antibiotic treatment for syphilis with Bicillin .  Neurology evaluation with lumbar puncture that was not suggestive for active neurosyphilis. She saw ophthalmology for visual change symptoms reports findings were not concerning for infectious or inflammatory type changes.   No Rheumatology ROS completed.   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   Right hip pain 05/16/2020   BRBPR (bright red blood per rectum) 10/18/2019   Tobacco use disorder 07/31/2019   Dyspnea, paroxysmal nocturnal 07/31/2019   Schizophrenia (HCC) 07/31/2019   Encounter for screening examination for sexually transmitted disease 07/31/2019   Abnormal uterine bleeding (AUB) 05/09/2013   BV (bacterial vaginosis) 05/09/2013   Dysmenorrhea 05/09/2013    Past Medical History:  Diagnosis Date   Aneurysm (HCC)    Arthritis    RA   Kidney stones    Lupus 01/17/2021  Lupus    Schizophrenia (HCC)    Systemic lupus erythematosus (HCC)     Family History  Problem Relation Age of Onset   Stroke Mother    Multiple sclerosis Father    Hypertension Father    Diabetes Father    Breast cancer Maternal Aunt        unknown age   Dementia Maternal Grandfather    Asthma Brother    Past Surgical History:  Procedure Laterality Date   BREAST BIOPSY Right 09/29/2022   US  RT BREAST BX W LOC DEV 1ST LESION IMG BX SPEC US  GUIDE 09/29/2022 [facility]   CESAREAN SECTION     HERNIA REPAIR     IR 3D INDEPENDENT WKST  07/28/2021   IR ANGIO INTRA EXTRACRAN SEL INTERNAL CAROTID BILAT MOD SED  07/28/2021   IR ANGIO VERTEBRAL SEL VERTEBRAL UNI L MOD SED  07/28/2021   IR ANGIOGRAM FOLLOW UP STUDY  07/28/2021   IR CT HEAD LTD  07/28/2021   IR NEURO EACH ADD'L AFTER BASIC UNI LEFT (MS)  07/28/2021   IR RADIOLOGIST EVAL & MGMT  07/10/2021   IR RADIOLOGIST EVAL & MGMT  08/15/2021   IR TRANSCATH/EMBOLIZ  07/28/2021   IR US  GUIDE VASC ACCESS RIGHT  07/28/2021   RADIOLOGY WITH ANESTHESIA N/A  07/28/2021   Procedure: EMBOLIZATION;  Surgeon: Luellen Sages, MD;  Location: [facility] OR;  Service: Radiology;  Laterality: N/A;   Social History   Social History Narrative   Right handed   Immunization History  Administered Date(s) Administered   Hepatitis A, Adult 08/26/2022, 03/03/2023   Hepb-cpg 06/25/2021, 09/02/2021   Influenza, Seasonal, Injecte, Preservative Fre 12/31/2022   Influenza,inj,Quad PF,6+ Mos 01/30/2021, 12/31/2021   Tdap 02/25/2022     Objective: Vital Signs: There were no vitals taken for this visit.   Physical Exam   Musculoskeletal Exam: ***  CDAI Exam: CDAI Score: -- Patient Global: --; Provider Global: -- Swollen: --; Tender: -- Joint Exam 08/13/2023   No joint exam has been documented for this visit   There is currently no information documented on the homunculus. Go to the Rheumatology activity and complete the homunculus joint exam.  Investigation: No additional findings.  Imaging: No results found.   Recent Labs: Lab Results  Component Value Date   WBC 7.5 05/24/2023   HGB 10.5 (L) 05/24/2023   PLT 189 05/24/2023   NA 141 05/24/2023   K 4.1 05/24/2023   CL 110 05/24/2023   CO2 26 05/24/2023   GLUCOSE 102 (H) 05/24/2023   BUN 11 05/24/2023   CREATININE 0.77 05/24/2023   BILITOT 0.4 05/24/2023   ALKPHOS 58 12/23/2022   AST 19 05/24/2023   ALT 14 05/24/2023   PROT 6.4 05/24/2023   ALBUMIN 4.0 12/23/2022   CALCIUM 9.0 05/24/2023   GFRAA 117 05/20/2020    Speciality Comments: PLQ Eye Exam: 04/09/2022 WNL  @[facility] f/u 3-4 weeks  Patient states she will contact the office once she schedules her eye exam.   Procedures:  No procedures performed Allergies: Other   Assessment / Plan:     Visit Diagnoses: No diagnosis found.  ***  Orders: No orders of the defined types were placed in this encounter.  No orders of the defined types were placed in this encounter.    Follow-Up Instructions: No follow-ups on  file.   Glena Landau, RT  Note - This record has been created using [employer].  Chart creation errors have been sought, but  may not always  have been located. Such creation errors do not reflect on  the standard of medical care.

## 2023-08-06 ENCOUNTER — Other Ambulatory Visit: Payer: Self-pay

## 2023-08-06 ENCOUNTER — Telehealth: Payer: Self-pay

## 2023-08-06 ENCOUNTER — Other Ambulatory Visit: Payer: Self-pay | Admitting: *Deleted

## 2023-08-06 DIAGNOSIS — R21 Rash and other nonspecific skin eruption: Secondary | ICD-10-CM

## 2023-08-06 DIAGNOSIS — Z79899 Other long term (current) drug therapy: Secondary | ICD-10-CM

## 2023-08-06 DIAGNOSIS — M2550 Pain in unspecified joint: Secondary | ICD-10-CM

## 2023-08-06 DIAGNOSIS — R768 Other specified abnormal immunological findings in serum: Secondary | ICD-10-CM

## 2023-08-06 MED ORDER — SULFASALAZINE 500 MG PO TBEC: 500.0000 mg | DELAYED_RELEASE_TABLET | Freq: Two times a day (BID) | ORAL | 0 refills | Status: DC

## 2023-08-06 MED ORDER — HYDROXYCHLOROQUINE SULFATE 200 MG PO TABS: ORAL_TABLET | ORAL | 0 refills | Status: DC

## 2023-08-06 NOTE — Telephone Encounter (Signed)
 Last Fill: 07/23/2023  Eye exam: 04/09/2022 WNL   Labs: 05/24/2023 Blood count and kidney and liver function are all normal.  No problem for continuing the hydroxychloroquine  and sulfasalazine .  Next Visit: 08/13/2023  Last Visit: 11/02/2022  [id number] rheumatoid arthritis   Current Dose per office note 11/02/2022: hydroxychloroquine  200 mg daily and sulfasalazine  1000 mg twice daily.   Contacted patient about her PLQ Eye exam needing to be updated. She is needing a new referral to another Dr.  Ortencia Blamer to refill Plaquenil  and Sulfasalazine ?

## 2023-08-06 NOTE — Telephone Encounter (Signed)
 Patient needs referral to new Ophthalmologist, patient missed an appointment with Dr Candi Chafe due to being sick. She is unable to pay the no show fee due to being on a fixed income.

## 2023-08-06 NOTE — Telephone Encounter (Signed)
 Referral ordered and faxed, pending appt.

## 2023-08-09 ENCOUNTER — Other Ambulatory Visit (HOSPITAL_COMMUNITY): Payer: Self-pay

## 2023-08-10 ENCOUNTER — Other Ambulatory Visit (HOSPITAL_COMMUNITY): Payer: Self-pay

## 2023-08-10 ENCOUNTER — Other Ambulatory Visit: Payer: Self-pay

## 2023-08-10 ENCOUNTER — Other Ambulatory Visit: Payer: Self-pay | Admitting: Pharmacist

## 2023-08-10 DIAGNOSIS — Z79899 Other long term (current) drug therapy: Secondary | ICD-10-CM

## 2023-08-10 MED ORDER — APRETUDE 600 MG/3ML IM SUER
600.0000 mg | INTRAMUSCULAR | 5 refills | Status: AC
  Filled 2023-08-10: qty 3, 60d supply, fill #0
  Filled 2023-10-20: qty 3, 60d supply, fill #1
  Filled 2023-12-27: qty 3, 60d supply, fill #2
  Filled 2024-02-10: qty 3, 60d supply, fill #3
  Filled 2024-04-24: qty 3, 60d supply, fill #4

## 2023-08-10 NOTE — Progress Notes (Signed)
 Specialty Pharmacy Refill Coordination Note  Dominique [name] is a 48 y.o. female assessed today regarding refills of clinic administered specialty medication(s) Cabotegravir  (Apretude )   Clinic requested Courier to Provider Office   Delivery date: 08/25/23   Verified address: [address] [place] [address]   Medication will be filled on 08/24/23.

## 2023-08-13 ENCOUNTER — Ambulatory Visit: Admitting: Internal Medicine

## 2023-08-13 DIAGNOSIS — Z79899 Other long term (current) drug therapy: Secondary | ICD-10-CM

## 2023-08-13 DIAGNOSIS — I671 Cerebral aneurysm, nonruptured: Secondary | ICD-10-CM

## 2023-08-13 DIAGNOSIS — M0600 Rheumatoid arthritis without rheumatoid factor, unspecified site: Secondary | ICD-10-CM

## 2023-08-20 ENCOUNTER — Other Ambulatory Visit: Payer: Self-pay

## 2023-08-20 DIAGNOSIS — R768 Other specified abnormal immunological findings in serum: Secondary | ICD-10-CM

## 2023-08-20 DIAGNOSIS — M2550 Pain in unspecified joint: Secondary | ICD-10-CM

## 2023-08-20 DIAGNOSIS — R21 Rash and other nonspecific skin eruption: Secondary | ICD-10-CM

## 2023-08-20 NOTE — Telephone Encounter (Signed)
 Patient states she would like a refill of Hydroxychloroquine  and Sulfasalazine  sent to [facility] pharmacy.   Last Fill: 08/06/2023  Eye exam: 04/09/2022 WNL    Labs: 06/22/2023 CBC and BMP Glucose 68 RBC Count 3.69  Next Visit: patient has not been rescheduled yet  Last Visit: 11/02/2022  [id number] rheumatoid arthritis (HCC)   Current Dose per office note 11/02/2022: hydroxychloroquine  200 mg daily and sulfasalazine  1000 mg twice daily.   Patient states she has an appointment with [facility] in September.  Okay to refill Plaquenil  and Sulfasalazine ?

## 2023-08-24 MED ORDER — SULFASALAZINE 500 MG PO TBEC: 500.0000 mg | DELAYED_RELEASE_TABLET | Freq: Two times a day (BID) | ORAL | 0 refills | Status: DC

## 2023-08-24 MED ORDER — HYDROXYCHLOROQUINE SULFATE 200 MG PO TABS: ORAL_TABLET | ORAL | 0 refills | Status: DC

## 2023-08-24 NOTE — Telephone Encounter (Signed)
 Overdue for visit but cancelled due to my absence, so will send new Rx refill for 1 month since she needs to reschedule.

## 2023-08-25 ENCOUNTER — Telehealth: Payer: Self-pay

## 2023-08-25 NOTE — Telephone Encounter (Signed)
Attempted to contact patient and left message to advise patient to call the office and schedule a follow up appointment.

## 2023-08-25 NOTE — Telephone Encounter (Signed)
 [facility] Patient Advocate Encounter  Patient's medications APRETUDE  have been couriered to [facility] from [facility] and will be administered at the patients appointment on 08/30/23.  Verline Glow, CPhT Specialty Pharmacy Patient [facility] for Infectious Disease Phone: [phone] Fax:  [phone]

## 2023-08-27 ENCOUNTER — Other Ambulatory Visit: Payer: Self-pay | Admitting: Internal Medicine

## 2023-08-27 DIAGNOSIS — M2550 Pain in unspecified joint: Secondary | ICD-10-CM

## 2023-08-27 DIAGNOSIS — R768 Other specified abnormal immunological findings in serum: Secondary | ICD-10-CM

## 2023-08-27 DIAGNOSIS — R21 Rash and other nonspecific skin eruption: Secondary | ICD-10-CM

## 2023-08-30 ENCOUNTER — Ambulatory Visit: Admitting: Pharmacist

## 2023-08-31 NOTE — Progress Notes (Unsigned)
 HPI: Dominique [name] is a 48 y.o. female who presents to the [facility] pharmacy clinic for Apretude  administration and HIV PrEP follow up.  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   CAPSAICIN  (ZOSTRIX) 0.025 % CREAM    Apply 1 application. topically 3 (three) times daily.   CETIRIZINE (ZYRTEC) 10 MG TABLET    Take 10 mg by mouth daily.   CITALOPRAM (CELEXA) 20 MG TABLET    Take 1 tablet by mouth daily.   CLOPIDOGREL  (PLAVIX ) 75 MG TABLET    Take 0.5 tablets (37.5 mg total) by mouth daily.   DEPO-SUBQ PROVERA  104 104 MG/0.65ML INJECTION    every 3 (three) months.   DICLOFENAC  SODIUM (VOLTAREN ) 1 % GEL    Apply 2 g topically 4 (four) times daily.   FAMOTIDINE (PEPCID) 20 MG TABLET    Take 20 mg by mouth at bedtime.   FLUTICASONE (FLONASE) 50 MCG/ACT NASAL SPRAY    Place into the nose.    FOLIC ACID  (FOLVITE ) 1 MG TABLET    Take 1 tablet (1 mg total) by mouth daily.   FOLIC ACID  (FOLVITE ) 1 MG TABLET    Take 1 tablet by mouth daily.   GABAPENTIN  (NEURONTIN ) 600 MG TABLET    Take 600 mg by mouth 3 (three) times daily.   HYDROXYCHLOROQUINE  (PLAQUENIL ) 200 MG TABLET    TAKE 1 TABLET(200 MG) BY MOUTH DAILY   MEDROXYPROGESTERONE  (DEPO-SUBQ PROVERA  104) 104 MG/0.65ML INJECTION    Inject into the skin.   MELATONIN (MELATONIN MAXIMUM STRENGTH) 5 MG TABS    Take by mouth.   MELOXICAM  (MOBIC ) 15 MG TABLET    Take 1 tablet (15 mg total) by mouth daily.   MISC. DEVICES (ROLLATOR ULTRA-LIGHT) MISC    Rollator/Rolling Walker with seat, use for balance and support with ambulation   NORETHINDRONE (MICRONOR) 0.35 MG TABLET    Take 1 tablet by mouth daily.   NORTRIPTYLINE  (PAMELOR ) 25 MG CAPSULE    TAKE 1 CAPSULE(25 MG) BY MOUTH AT BEDTIME   PREGABALIN (LYRICA) 100 MG CAPSULE    Take by mouth.   PREGABALIN (LYRICA) 100 MG CAPSULE    Take 100 mg by mouth 3 (three) times daily.   QUETIAPINE  (SEROQUEL ) 100 MG TABLET    Take 100 mg by mouth daily.   SULFASALAZINE  (AZULFIDINE ) 500 MG EC TABLET    Take 1  tablet (500 mg total) by mouth 2 (two) times daily.   TIZANIDINE (ZANAFLEX) 4 MG TABLET    Take 4 mg by mouth 2 (two) times daily.   VALACYCLOVIR (VALTREX) 500 MG TABLET    Take 500 mg by mouth daily.   VITAMIN D , ERGOCALCIFEROL , (DRISDOL ) 1.25 MG (50000 UNIT) CAPS CAPSULE    Take 50,000 Units by mouth once a week.  Modified Medications   No medications on file  Discontinued Medications   No medications on file    Allergies: Allergies  Allergen Reactions   Other     Labs: Lab Results  Component Value Date   HIV1RNAQUANT NOT DETECTED 06/29/2023   HIV1RNAQUANT Not Detected 05/11/2023   HIV1RNAQUANT Not Detected 03/03/2023    RPR and STI Lab Results  Component Value Date   LABRPR REACTIVE (A) 03/03/2023   LABRPR REACTIVE (A) 08/26/2022   LABRPR REACTIVE (A) 06/24/2022   LABRPR  REACTIVE (A) 04/29/2022   LABRPR REACTIVE (A) 06/25/2021   RPRTITER 1:1 (H) 03/03/2023   RPRTITER 1:2 (H) 08/26/2022   RPRTITER 1:2 (H) 06/24/2022   RPRTITER 1:2 (H) 04/29/2022   RPRTITER 1:8 (H) 06/25/2021    STI Results GC GC CT CT  05/11/2023 11:01 AM Negative    Negative   Negative    Negative    03/03/2023  9:41 AM Negative    Negative   Negative    Negative    08/26/2022  9:14 AM Negative    Negative   Negative    Negative    06/24/2022 10:42 AM Negative    Negative   Negative    Negative    04/29/2022 10:00 AM Negative   Negative    10/31/2021  9:06 AM Negative   Negative    09/02/2021  9:56 AM Negative   Negative    06/25/2021  8:33 AM Negative   Negative    01/30/2021 10:14 AM Negative   Negative    10/16/2019  3:39 PM Negative   Negative    05/09/2013 10:57 AM  NEGATIVE   NEGATIVE   12/07/2011 12:20 PM   NEGATIVE      Hepatitis B Lab Results  Component Value Date   HEPBSAB REACTIVE (A) 12/31/2021   HEPBSAG NON-REACTIVE 12/18/2020   HEPBCAB NON-REACTIVE 12/18/2020   Hepatitis C Lab Results  Component Value Date   HEPCAB NON-REACTIVE 12/18/2020   Hepatitis A Lab Results  Component Value Date   HAV REACTIVE (A) 05/11/2023   Lipids: Lab Results  Component Value Date   CHOL 154 07/28/2019   TRIG 96 07/28/2019   HDL 57 07/28/2019   CHOLHDL 2.7 07/28/2019   LDLCALC 79 07/28/2019    TARGET DATE: The 3rd of each month  Assessment: Xitlali presents today for maintenance Apretude  injection and to follow up for HIV PrEP. No issues with past injections. Denies any symptoms of acute HIV. Last STI screening was on 05/11/2023 and was negative. No known exposures to any STIs since last visit. Kenita agrees to STI testing today.    Per [employer] guidelines, a rapid HIV test should be drawn prior to Apretude  administration. Due to state shortage of rapid HIV tests, this is temporarily unable to be done. Per decision from [facility] physicians, we will proceed with  Apretude  administration at this time without a negative rapid HIV test beforehand. HIV RNA was collected today and is in process.  Administered cabotegravir  600mg /93mL in left upper outer quadrant of the gluteal muscle. Will see Nadira back in  2 months for injection, labs, and HIV PrEP follow up.  Plan: - Apretude  injection administered - HIV RNA today  - Urine, oral cytology and RPR collected today - Next injection, labs, and PrEP follow up appointment scheduled for 11/01/2023 - Call with any issues or questions  Tolu Alverna Fawley, PharmD [facility] PGY-1

## 2023-09-01 ENCOUNTER — Ambulatory Visit: Admitting: Pharmacist

## 2023-09-01 ENCOUNTER — Other Ambulatory Visit (HOSPITAL_COMMUNITY)
Admission: RE | Admit: 2023-09-01 | Discharge: 2023-09-01 | Disposition: A | Discharge status: Home / Self Care | Source: Ambulatory Visit | Attending: Infectious Disease | Admitting: Infectious Disease

## 2023-09-01 ENCOUNTER — Other Ambulatory Visit: Payer: Self-pay

## 2023-09-01 DIAGNOSIS — Z79899 Other long term (current) drug therapy: Secondary | ICD-10-CM | POA: Diagnosis present

## 2023-09-01 DIAGNOSIS — Z114 Encounter for screening for human immunodeficiency virus [HIV]: Secondary | ICD-10-CM

## 2023-09-01 DIAGNOSIS — Z2981 Encounter for HIV pre-exposure prophylaxis: Secondary | ICD-10-CM | POA: Diagnosis not present

## 2023-09-01 DIAGNOSIS — Z113 Encounter for screening for infections with a predominantly sexual mode of transmission: Secondary | ICD-10-CM

## 2023-09-01 MED ORDER — CABOTEGRAVIR ER 600 MG/3ML IM SUER
600.0000 mg | Freq: Once | INTRAMUSCULAR | Status: AC
  Administered 2023-09-01: 600 mg via INTRAMUSCULAR

## 2023-09-02 LAB — URINE CYTOLOGY ANCILLARY ONLY
Chlamydia: NEGATIVE
Comment: NEGATIVE
Comment: NORMAL
Neisseria Gonorrhea: NEGATIVE

## 2023-09-02 LAB — CYTOLOGY, (ORAL, ANAL, URETHRAL) ANCILLARY ONLY
Chlamydia: NEGATIVE
Comment: NEGATIVE
Comment: NORMAL
Neisseria Gonorrhea: NEGATIVE

## 2023-09-03 LAB — HIV-1 RNA QUANT-NO REFLEX-BLD
HIV 1 RNA Quant: NOT DETECTED {copies}/mL
HIV-1 RNA Quant, Log: NOT DETECTED {Log_copies}/mL

## 2023-09-03 LAB — RPR: RPR Ser Ql: NONREACTIVE

## 2023-09-06 ENCOUNTER — Ambulatory Visit: Admitting: Physician Assistant

## 2023-09-09 ENCOUNTER — Ambulatory Visit: Admitting: Physician Assistant

## 2023-09-09 ENCOUNTER — Encounter: Payer: Self-pay | Admitting: Internal Medicine

## 2023-09-09 ENCOUNTER — Telehealth (HOSPITAL_COMMUNITY): Payer: Self-pay

## 2023-09-09 ENCOUNTER — Ambulatory Visit: Attending: Internal Medicine | Admitting: Internal Medicine

## 2023-09-09 VITALS — BP 104/68 | HR 56 | Resp 14 | Ht 60.0 in | Wt 117.0 lb

## 2023-09-09 DIAGNOSIS — M2550 Pain in unspecified joint: Secondary | ICD-10-CM

## 2023-09-09 DIAGNOSIS — Z79899 Other long term (current) drug therapy: Secondary | ICD-10-CM | POA: Diagnosis not present

## 2023-09-09 DIAGNOSIS — M0600 Rheumatoid arthritis without rheumatoid factor, unspecified site: Secondary | ICD-10-CM

## 2023-09-09 DIAGNOSIS — R21 Rash and other nonspecific skin eruption: Secondary | ICD-10-CM

## 2023-09-09 NOTE — Progress Notes (Signed)
 Office Visit Note  Patient: Dominique [name]             Date of Birth: [birth year]           MRN: [id number]             PCP: Earvin Johnston PARAS, FNP Referring: Earvin Johnston PARAS, FNP Visit Date: 09/09/2023   Subjective:  Follow-up (Patient states she has pain in her legs. )   Discussed the use of AI scribe software for clinical note transcription with the patient, who gave verbal consent to proceed.  History of Present Illness   Dominique [name] is a 48 y.o. 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  She has been experiencing significant leg weakness and balance issues over the past month, with her balance deteriorating to the point where she can stand for only a minute before her legs become too weak. She has fallen approximately three times a week, with the most recent fall occurring yesterday. She attributes her balance issues primarily to leg weakness rather than vertigo or dizziness.  She has a history of rheumatoid arthritis and was previously on a higher dose of sulfasalazine , which was decreased last year. She continues to experience joint problems, including redness and swelling, particularly around her knees. She is currently taking Lyrica for leg numbness.  She has been seeing an orthopedic specialist recently for her ankle issues.    Previous HPI 11/02/2022 Dominique [name] is a 48 y.o. 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  Since her last visit she has not had any major exacerbation of symptoms.  Most problematic joint still in bilateral ankles mostly pain with weightbearing.  Does not see a lot of associated swelling occasionally some that last less than 1 day duration.  Still with discolored skin rashes mostly on the face not itchy or painful.  She is noticing more bruising on her lower legs does not recall  any preceding trauma to the sites.  No bruising on the upper part of her body.  She is scheduled for follow-up 18-month MRI for monitoring of aneurysm but with no new symptom complaints.   Previous HPI 07/15/2022 Dominique [name] is a 48 y.o. 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  Since her last visit she saw physical therapy completed 1 session with them so far is scheduled to follow-up planning to do some pool or other aquatic therapy.  She switched to Lyrica with pain management so far finding this more effective than the gabapentin  for ongoing symptoms.  She had mildly low potassium checked in previous clinic visit was concerned if this contributed to muscle cramping issues but was improved back to normal range upon recheck.  Still getting some stiffness and soreness most noticeably at the feet and ankles and some lasting through the day but no major exacerbation.  Not seeing much visible swelling or rashes.   Previous HPI 04/15/22 Dominique [name] is a 48 y.o. female here for follow up for seronegative RA (ANA+) on hydroxychloroquine  200 mg daily and sulfasalazine  1000 mg twice.  No major flareups since her last visit.  Hyperpigmented skin changes mostly around the face may be the same or slightly worse.  Still with persistent foot and ankle pain.   Previous  HPI 12/10/21 Dominique [name] is a 48 y.o. 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  Most symptoms are stable main complaint today is bilateral foot and ankle pain and swelling. No obvious difference off of HCQ. Pain is throughout the day describes as stabbing with pins type of sensation. She is seeing swelling accumulate usually by the evening and worse when sitting or standing in fixed position for a long time.    Previous HPI 09/12/2021 Dominique [name] is a 48 y.o. 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 She feels symptoms partially improved with less knee pain but continues to have every day pain in bilateral feet and ankles. Pain is first thing in the morning as well as worsening if she is on her feet all day. Feet swelling is noticeable with prolonged standing worse later in the day. She had coil embolization procedure uneventfully. She has noticed some progression of facial rash.   Previous HPI 07/21/21 Dominique [name] is a 48 y.o. 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 So far she still has about the same amount of pain worst affected areas in both heels and in mid feet. Plantar fasciitis exercises mildly helpful for the bottom of the feet but not much. 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    Previous HPI 05/20/21 Dominique [name] is a 48 y.o. 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 So far her symptoms are about the same. She continues having pain worst in her feet and less extent along medial side of knees. She does not see much swelling or discoloration just pain. Has not noticed any side effects of the methotrexate .   Previous HPI 01/23/21 Dominique [name] is a 48 y.o. female here for evaluation of positive ANA and elevated sedimentation rate with multiple systemic symptoms including weight loss, lymphadenopathy, paresthesias, joint pain, and headaches.  She started noticing hyperpigmented skin rashes developing on her face and extremities since several months ago does not recall specific onset or provoking episode.  Then developed worsening joint pain in multiple sites but started around July of this year.  This involved pain affecting both hands also the hips knees and feet bilaterally.  She described morning stiffness lasting 2 or  3 hours in duration also especially pain in the bottoms of the feet first and getting out of bed.  After a month with these ongoing and somewhat worsening symptoms she was seen in primary care clinic. Workup at that time positive for trichomoniasis and syphilis and starting antibiotics also found to have high sedimentation rate with positive ANA and CCP Abs. She followed up for continued treatment last month and more recently in ID clinic with completion of antibiotic treatment for syphilis with Bicillin .  Neurology evaluation with lumbar puncture that was not suggestive for active neurosyphilis. She saw ophthalmology for visual change symptoms reports findings were not concerning for infectious or inflammatory type changes.   Review of Systems  Constitutional:  Positive for fatigue.  HENT:  Positive for mouth dryness. Negative for mouth sores.   Eyes:  Positive for dryness.  Respiratory:  Positive for shortness of breath.  Cardiovascular:  Negative for chest pain and palpitations.  Gastrointestinal:  Negative for blood in stool, constipation and diarrhea.  Endocrine: Negative for increased urination.  Genitourinary:  Negative for involuntary urination.  Musculoskeletal:  Positive for joint pain, gait problem, joint pain, joint swelling, myalgias, muscle weakness, morning stiffness, muscle tenderness and myalgias.  Skin:  Positive for color change, hair loss and sensitivity to sunlight. Negative for rash.  Allergic/Immunologic: Positive for susceptible to infections.  Neurological:  Negative for dizziness and headaches.  Hematological:  Negative for swollen glands.  Psychiatric/Behavioral:  Positive for depressed mood and sleep disturbance. The patient is nervous/anxious.     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   Right hip pain 05/16/2020   BRBPR (bright red blood per rectum) 10/18/2019   Tobacco use disorder 07/31/2019   Dyspnea, paroxysmal nocturnal 07/31/2019   Schizophrenia (HCC) 07/31/2019   Encounter for screening examination for sexually transmitted disease 07/31/2019   Abnormal uterine bleeding (AUB) 05/09/2013   BV (bacterial vaginosis) 05/09/2013   Dysmenorrhea 05/09/2013    Past Medical History:  Diagnosis Date   Aneurysm (HCC)    Arthritis    RA   Kidney stones    Lupus 01/17/2021   Lupus    Schizophrenia (HCC)    Systemic lupus erythematosus (HCC)     Family History  Problem Relation Age of Onset   Stroke Mother    Multiple sclerosis Father    Hypertension Father    Diabetes Father    Breast cancer Maternal Aunt        unknown age   Dementia Maternal Grandfather    Asthma Brother    Past Surgical History:  Procedure Laterality Date   BREAST BIOPSY Right 09/29/2022   US  RT BREAST BX W LOC DEV 1ST LESION IMG BX SPEC US  GUIDE 09/29/2022 [facility]   CESAREAN SECTION     HERNIA REPAIR     IR 3D INDEPENDENT WKST  07/28/2021   IR ANGIO INTRA EXTRACRAN SEL INTERNAL CAROTID BILAT MOD SED  07/28/2021   IR ANGIO VERTEBRAL SEL VERTEBRAL UNI L MOD SED  07/28/2021   IR ANGIOGRAM FOLLOW UP STUDY  07/28/2021   IR CT HEAD LTD  07/28/2021   IR NEURO EACH ADD'L AFTER BASIC UNI LEFT (MS)  07/28/2021   IR RADIOLOGIST EVAL & MGMT  07/10/2021   IR RADIOLOGIST EVAL & MGMT  08/15/2021   IR TRANSCATH/EMBOLIZ  07/28/2021   IR US  GUIDE VASC ACCESS RIGHT  07/28/2021   RADIOLOGY WITH ANESTHESIA N/A 07/28/2021   Procedure: EMBOLIZATION;  Surgeon: Dolphus Carrion, MD;  Location: [facility] OR;   Service: Radiology;  Laterality: N/A;   Social History   Social History Narrative   Right handed   Immunization History  Administered Date(s) Administered   Hepatitis A, Adult 08/26/2022, 03/03/2023   Hepb-cpg 06/25/2021, 09/02/2021   Influenza, Seasonal, Injecte, Preservative Fre 12/31/2022   Influenza,inj,Quad PF,6+ Mos 01/30/2021, 12/31/2021   Tdap 02/25/2022     Objective: Vital Signs: BP 104/68 (BP Location: Right Arm, Patient Position:  Sitting, Cuff Size: Normal)   Pulse (!) 56   Resp 14   Ht 5' (1.524 m)   Wt 117 lb (53.1 kg)   BMI 22.85 kg/m    Physical Exam  Eyes:     Conjunctiva/sclera: Conjunctivae normal.    Cardiovascular:     Rate and Rhythm: Normal rate and regular rhythm.  Pulmonary:     Effort: Pulmonary effort is normal.     Breath sounds: Normal breath sounds.   Musculoskeletal:     Right lower leg: No edema.     Left lower leg: No edema.  Lymphadenopathy:     Cervical: No cervical adenopathy.   Skin:    General: Skin is warm and dry.     Comments: Patchy hyper and hypopigmented changes on face no overlying papules or erythema    Neurological:     Mental Status: She is alert.   Psychiatric:        Mood and Affect: Mood normal.      Musculoskeletal Exam:  Shoulders full ROM no tenderness or swelling Elbows full ROM no tenderness or swelling Wrists full ROM no tenderness or swelling Fingers left 5th MCP with some bruising and pain with extension Knees full ROM no tenderness or swelling   Investigation: No additional findings.  Imaging: No results found.   Recent Labs: Lab Results  Component Value Date   WBC 7.2 09/09/2023   HGB 11.2 (L) 09/09/2023   PLT 169 09/09/2023   NA 139 09/09/2023   K 3.3 (L) 09/09/2023   CL 103 09/09/2023   CO2 27 09/09/2023   GLUCOSE 112 (H) 09/09/2023   BUN 7 09/09/2023   CREATININE 0.71 09/09/2023   BILITOT 0.6 09/09/2023   ALKPHOS 58 12/23/2022   AST 19 09/09/2023   ALT 16 09/09/2023    PROT 7.1 09/09/2023   ALBUMIN 4.0 12/23/2022   CALCIUM 9.7 09/09/2023   GFRAA 117 05/20/2020    Speciality Comments: PLQ Eye Exam: 04/09/2022 WNL  @[facility] f/u 3-4 weeks  Patient states she has an appointment with [facility] in September  Procedures:  No procedures performed Allergies: Other   Assessment / Plan:     Visit Diagnoses: Seronegative rheumatoid arthritis (HCC) - Plan: Sedimentation rate, C3 and C4, CK Leg weakness and balance issues have worsened with frequent falls. Differential includes neuropathy, muscle inflammation, or guarding due to joint pain. Uncontrolled inflammation is considered due to decreased sulfasalazine . - Continue hydroxychloroquine  200 mg daily and sulfasalazine  500 mg EC twice daily - Order blood tests including CK level and markers for rheumatoid arthritis or lupus inflammation. - Consider increasing sulfasalazine  dose if blood tests suggest uncontrolled inflammation. - Can refer to physical therapy for assessment and potential exercise treatments, following up with ortho.  High risk medication use - Plan: CBC with Differential/Platelet, Comprehensive metabolic panel with GFR - Checking CBC and CMP for medication monitoring on continued long-term use of sulfasalazine  and hydroxychloroquine  - Patient due for updated hydroxychloroquine  eye exam reports current appointment scheduled in September with [facility] ophthalmology  Neuropathy Neuropathy contributes to leg weakness and balance issues. Lyrica is used for symptom management.  Syphilis Syphilis eradicated as indicated by recent negative syphilis antibodies.       Orders: Orders Placed This Encounter  Procedures   Sedimentation rate   C3 and C4   CK   CBC with Differential/Platelet   Comprehensive metabolic panel with GFR   No orders of the defined types were placed in this encounter.  Follow-Up Instructions: No follow-ups on file.   Lonni LELON Ester,  MD  Note - This record has been created using [employer].  Chart creation errors have been sought, but may not always  have been located. Such creation errors do not reflect on  the standard of medical care.

## 2023-09-09 NOTE — Telephone Encounter (Signed)
 Called to schedule mra, no answer, left vm. AB

## 2023-09-10 LAB — COMPREHENSIVE METABOLIC PANEL WITH GFR
AG Ratio: 1.7 (calc) (ref 1.0–2.5)
ALT: 16 U/L (ref 6–29)
AST: 19 U/L (ref 10–35)
Albumin: 4.5 g/dL (ref 3.6–5.1)
Alkaline phosphatase (APISO): 64 U/L (ref 31–125)
BUN: 7 mg/dL (ref 7–25)
CO2: 27 mmol/L (ref 20–32)
Calcium: 9.7 mg/dL (ref 8.6–10.2)
Chloride: 103 mmol/L (ref 98–110)
Creat: 0.71 mg/dL (ref 0.50–0.99)
Globulin: 2.6 g/dL (ref 1.9–3.7)
Glucose, Bld: 112 mg/dL — ABNORMAL HIGH (ref 65–99)
Potassium: 3.3 mmol/L — ABNORMAL LOW (ref 3.5–5.3)
Sodium: 139 mmol/L (ref 135–146)
Total Bilirubin: 0.6 mg/dL (ref 0.2–1.2)
Total Protein: 7.1 g/dL (ref 6.1–8.1)
eGFR: 106 mL/min/{1.73_m2} (ref >=60)

## 2023-09-10 LAB — CBC WITH DIFFERENTIAL/PLATELET
Absolute Lymphocytes: 3146 {cells}/uL (ref 850–3900)
Absolute Monocytes: 439 {cells}/uL (ref 200–950)
Basophils Absolute: 7 {cells}/uL (ref 0–200)
Basophils Relative: 0.1 %
Eosinophils Absolute: 22 {cells}/uL (ref 15–500)
Eosinophils Relative: 0.3 %
HCT: 33.6 % — ABNORMAL LOW (ref 35.0–45.0)
Hemoglobin: 11.2 g/dL — ABNORMAL LOW (ref 11.7–15.5)
MCH: 32.4 pg (ref 27.0–33.0)
MCHC: 33.3 g/dL (ref 32.0–36.0)
MCV: 97.1 fL (ref 80.0–100.0)
MPV: 11 fL (ref 7.5–12.5)
Monocytes Relative: 6.1 %
Neutro Abs: 3586 {cells}/uL (ref 1500–7800)
Neutrophils Relative %: 49.8 %
Platelets: 169 10*3/uL (ref 140–400)
RBC: 3.46 10*6/uL — ABNORMAL LOW (ref 3.80–5.10)
RDW: 14 % (ref 11.0–15.0)
Total Lymphocyte: 43.7 %
WBC: 7.2 10*3/uL (ref 3.8–10.8)

## 2023-09-10 LAB — C3 AND C4
C3 Complement: 122 mg/dL (ref 83–193)
C4 Complement: 20 mg/dL (ref 15–57)

## 2023-09-10 LAB — CK: Total CK: 333 U/L — ABNORMAL HIGH (ref 20–239)

## 2023-09-10 LAB — SEDIMENTATION RATE: Sed Rate: 11 mm/h (ref 0–20)

## 2023-09-13 ENCOUNTER — Ambulatory Visit: Admitting: Internal Medicine

## 2023-09-16 ENCOUNTER — Other Ambulatory Visit: Payer: Self-pay | Admitting: Internal Medicine

## 2023-09-16 DIAGNOSIS — M2550 Pain in unspecified joint: Secondary | ICD-10-CM

## 2023-09-16 DIAGNOSIS — R768 Other specified abnormal immunological findings in serum: Secondary | ICD-10-CM

## 2023-09-16 DIAGNOSIS — R21 Rash and other nonspecific skin eruption: Secondary | ICD-10-CM

## 2023-09-20 ENCOUNTER — Ambulatory Visit (HOSPITAL_COMMUNITY)

## 2023-09-24 ENCOUNTER — Ambulatory Visit (HOSPITAL_COMMUNITY)
Admission: RE | Admit: 2023-09-24 | Discharge: 2023-09-24 | Disposition: A | Discharge status: Home / Self Care | Source: Ambulatory Visit | Attending: Interventional Radiology | Admitting: Interventional Radiology

## 2023-09-24 ENCOUNTER — Encounter (HOSPITAL_COMMUNITY): Payer: Self-pay | Admitting: Interventional Radiology

## 2023-09-24 DIAGNOSIS — I671 Cerebral aneurysm, nonruptured: Secondary | ICD-10-CM | POA: Insufficient documentation

## 2023-09-27 ENCOUNTER — Other Ambulatory Visit: Payer: Self-pay | Admitting: Internal Medicine

## 2023-09-27 DIAGNOSIS — R768 Other specified abnormal immunological findings in serum: Secondary | ICD-10-CM

## 2023-09-27 DIAGNOSIS — M2550 Pain in unspecified joint: Secondary | ICD-10-CM

## 2023-09-27 DIAGNOSIS — R21 Rash and other nonspecific skin eruption: Secondary | ICD-10-CM

## 2023-09-28 NOTE — Telephone Encounter (Signed)
 Last Fill: 08/24/2023  Eye exam: 04/09/2022 WNL, appointment scheduled for September.    Labs: 09/09/2023 RBC 3.46 Hemoglobin 11.2 HCT 33.6 Glucose 112 Potassium 3.3  Next Visit: 12/10/2023  Last Visit: 09/09/2023  IK:Dzmnwzhjupcz rheumatoid arthritis (HCC)   Current Dose per office note 09/09/2023: hydroxychloroquine  200 mg daily and sulfasalazine  500 mg EC twice daily   Okay to refill Plaquenil  and Sulfasalazine ?

## 2023-10-04 MED ORDER — HYDROXYCHLOROQUINE SULFATE 200 MG PO TABS: ORAL_TABLET | ORAL | 0 refills | Status: DC

## 2023-10-05 ENCOUNTER — Telehealth (HOSPITAL_COMMUNITY): Payer: Self-pay

## 2023-10-05 NOTE — Telephone Encounter (Signed)
 Pt is aware of Dr. Dolphus leaving and agreed to f/u in 1 year with PCP or neurology for f/u imaging. AB

## 2023-10-20 ENCOUNTER — Other Ambulatory Visit (HOSPITAL_COMMUNITY): Payer: Self-pay

## 2023-10-20 ENCOUNTER — Other Ambulatory Visit: Payer: Self-pay

## 2023-10-20 NOTE — Progress Notes (Signed)
 Specialty Pharmacy Refill Coordination Note  Dominique JALLOH is a 48 y.o. female assessed today regarding refills of clinic administered specialty medication(s) Cabotegravir  (Apretude )   Clinic requested Courier to Provider Office   Delivery date: 10/28/23   Verified address: [address] [place] [address]   Medication will be filled on 10/27/23.

## 2023-10-26 ENCOUNTER — Other Ambulatory Visit: Payer: Self-pay

## 2023-10-27 ENCOUNTER — Other Ambulatory Visit: Payer: Self-pay

## 2023-10-27 NOTE — Progress Notes (Deleted)
 HPI: Dominique [name] is a 48 y.o. female who presents to the [facility] pharmacy clinic for Apretude  administration and HIV PrEP follow up.  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   CAPSAICIN  (ZOSTRIX) 0.025 % CREAM    Apply 1 application. topically 3 (three) times daily.   CETIRIZINE (ZYRTEC) 10 MG TABLET    Take 10 mg by mouth daily.   CITALOPRAM (CELEXA) 20 MG TABLET    Take 1 tablet by mouth daily.   CLOPIDOGREL  (PLAVIX ) 75 MG TABLET    Take 0.5 tablets (37.5 mg total) by mouth daily.   DEPO-SUBQ PROVERA  104 104 MG/0.65ML INJECTION    every 3 (three) months.   DICLOFENAC  SODIUM (VOLTAREN ) 1 % GEL    Apply 2 g topically 4 (four) times daily.   FAMOTIDINE (PEPCID) 20 MG TABLET    Take 20 mg by mouth at bedtime.   FLUTICASONE (FLONASE) 50 MCG/ACT NASAL SPRAY    Place into the nose.   FOLIC ACID  (FOLVITE )  1 MG TABLET    Take 1 tablet (1 mg total) by mouth daily.   FOLIC ACID  (FOLVITE ) 1 MG TABLET    Take 1 tablet by mouth daily.   GABAPENTIN  (NEURONTIN ) 600 MG TABLET    Take 600 mg by mouth 3 (three) times daily.   HYDROXYCHLOROQUINE  (PLAQUENIL ) 200 MG TABLET    TAKE 1 TABLET(200 MG) BY MOUTH DAILY   MEDROXYPROGESTERONE  (DEPO-SUBQ PROVERA  104) 104 MG/0.65ML INJECTION    Inject into the skin.   MELATONIN (MELATONIN MAXIMUM STRENGTH) 5 MG TABS    Take by mouth.   MELOXICAM  (MOBIC ) 15 MG TABLET    Take 1 tablet (15 mg total) by mouth daily.   MISC. DEVICES (ROLLATOR ULTRA-LIGHT) MISC    Rollator/Rolling Walker with seat, use for balance and support with ambulation   NORETHINDRONE (MICRONOR) 0.35 MG TABLET    Take 1 tablet by mouth daily.   NORTRIPTYLINE  (PAMELOR ) 25 MG CAPSULE    TAKE 1 CAPSULE(25 MG) BY MOUTH AT BEDTIME   PREDNISONE (DELTASONE) 10 MG TABLET    Take 10 mg by mouth daily.   PREGABALIN (LYRICA) 100 MG CAPSULE    Take by mouth.   PREGABALIN (LYRICA) 100 MG CAPSULE    Take 100 mg by mouth 3 (three) times daily.   QUETIAPINE  (SEROQUEL ) 100 MG TABLET    Take 100 mg by mouth daily.  SULFASALAZINE  (AZULFIDINE ) 500 MG EC TABLET    TAKE 1 TABLET BY MOUTH TWICE DAILY   TIZANIDINE (ZANAFLEX) 4 MG TABLET    Take 4 mg by mouth 2 (two) times daily.   VALACYCLOVIR (VALTREX) 500 MG TABLET    Take 500 mg by mouth daily.   VITAMIN D , ERGOCALCIFEROL , (DRISDOL ) 1.25 MG (50000 UNIT) CAPS CAPSULE    Take 50,000 Units by mouth once a week.  Modified Medications   No medications on file  Discontinued Medications   No medications on file    Allergies: Allergies  Allergen Reactions   Other     seasonal    Labs: Lab Results  Component Value Date   HIV1RNAQUANT NOT DETECTED 09/01/2023   HIV1RNAQUANT NOT DETECTED 06/29/2023   HIV1RNAQUANT Not Detected 05/11/2023    RPR and STI Lab Results  Component Value Date   LABRPR NON-REACTIVE 09/01/2023   LABRPR REACTIVE (A) 03/03/2023   LABRPR  REACTIVE (A) 08/26/2022   LABRPR REACTIVE (A) 06/24/2022   LABRPR REACTIVE (A) 04/29/2022   RPRTITER 1:1 (H) 03/03/2023   RPRTITER 1:2 (H) 08/26/2022   RPRTITER 1:2 (H) 06/24/2022   RPRTITER 1:2 (H) 04/29/2022   RPRTITER 1:8 (H) 06/25/2021    STI Results GC GC CT CT  09/01/2023  9:20 AM Negative    Negative   Negative    Negative    05/11/2023 11:01 AM Negative    Negative   Negative    Negative    03/03/2023  9:41 AM Negative    Negative   Negative    Negative    08/26/2022  9:14 AM Negative    Negative   Negative    Negative    06/24/2022 10:42 AM Negative    Negative   Negative    Negative    04/29/2022 10:00 AM Negative   Negative    10/31/2021  9:06 AM Negative   Negative    09/02/2021  9:56 AM Negative   Negative    06/25/2021  8:33 AM Negative   Negative    01/30/2021 10:14 AM Negative   Negative    10/16/2019  3:39 PM Negative   Negative    05/09/2013 10:57 AM  NEGATIVE   NEGATIVE   12/07/2011 12:20 PM   NEGATIVE      Hepatitis B Lab Results  Component Value Date   HEPBSAB REACTIVE (A) 12/31/2021   HEPBSAG NON-REACTIVE 12/18/2020   HEPBCAB NON-REACTIVE 12/18/2020   Hepatitis C Lab Results  Component Value Date   HEPCAB NON-REACTIVE 12/18/2020   Hepatitis A Lab Results  Component Value Date   HAV REACTIVE (A) 05/11/2023   Lipids: Lab Results  Component Value Date   CHOL 154 07/28/2019   TRIG 96 07/28/2019   HDL 57 07/28/2019   CHOLHDL 2.7 07/28/2019   LDLCALC 79 07/28/2019    TARGET DATE: The 3rd  Assessment: Mariena presents today for her Apretude  injection and to follow up for HIV PrEP. No issues with past injections. Denies any symptoms of acute HIV. Last HIV RNA was negative on 09/01/23. Last STI screening was on 09/01/23 as well and was also negative. No known exposures to any STIs since last visit. ***Agrees to STI testing today.    Per [employer] guidelines, a rapid HIV test should be drawn prior to Apretude  administration. Due to state  shortage of rapid HIV tests, this is temporarily unable to be done. Per decision from [facility] physicians, we will proceed with Apretude  administration at this time  without a negative rapid HIV test beforehand. HIV RNA was collected today and is in process.  Administered cabotegravir  600mg /14mL in left upper outer quadrant of the gluteal muscle. Will see her back in 2 months for injection, labs, and HIV PrEP follow up.  Plan: - Apretude  injection administered - HIV RNA today - Next injection, labs, and PrEP follow up appointment scheduled for *** - Call with any issues or questions  Oluwadarasimi Favor L. Bruna Dills, PharmD, BCIDP, AAHIVP, CPP Clinical Pharmacist Practitioner - Infectious Diseases Clinical Pharmacist Lead - Specialty Pharmacy [facility] for Infectious Disease

## 2023-10-28 ENCOUNTER — Telehealth: Payer: Self-pay

## 2023-10-28 NOTE — Telephone Encounter (Signed)
 [facility] Patient Advocate Encounter  Patient's medications APRETUDE  have been couriered to [facility] from [facility] and will be administered at the patients appointment on 11/01/23.  Charmaine Sharps, CPhT Specialty Pharmacy Patient [facility] for Infectious Disease Phone: [phone] Fax:  [phone]

## 2023-11-01 ENCOUNTER — Ambulatory Visit: Admitting: Pharmacist

## 2023-11-01 DIAGNOSIS — Z79899 Other long term (current) drug therapy: Secondary | ICD-10-CM

## 2023-11-01 DIAGNOSIS — Z113 Encounter for screening for infections with a predominantly sexual mode of transmission: Secondary | ICD-10-CM

## 2023-11-02 NOTE — Progress Notes (Unsigned)
 HPI: Dominique [name] is a 48 y.o. female who presents to the [facility] pharmacy clinic for Apretude  administration and HIV PrEP follow up.  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   CAPSAICIN  (ZOSTRIX) 0.025 % CREAM    Apply 1 application. topically 3 (three) times daily.   CETIRIZINE (ZYRTEC) 10 MG TABLET    Take 10 mg by mouth daily.   CITALOPRAM (CELEXA) 20 MG TABLET    Take 1 tablet by mouth daily.   CLOPIDOGREL  (PLAVIX ) 75 MG TABLET    Take 0.5 tablets (37.5 mg total) by mouth daily.   DEPO-SUBQ PROVERA  104 104 MG/0.65ML INJECTION    every 3 (three) months.   DICLOFENAC  SODIUM (VOLTAREN ) 1 % GEL    Apply 2 g topically 4 (four) times daily.   FAMOTIDINE (PEPCID) 20 MG TABLET    Take 20 mg by mouth at bedtime.   FLUTICASONE (FLONASE) 50 MCG/ACT NASAL SPRAY    Place into the nose.   FOLIC ACID   (FOLVITE ) 1 MG TABLET    Take 1 tablet (1 mg total) by mouth daily.   FOLIC ACID  (FOLVITE ) 1 MG TABLET    Take 1 tablet by mouth daily.   GABAPENTIN  (NEURONTIN ) 600 MG TABLET    Take 600 mg by mouth 3 (three) times daily.   HYDROXYCHLOROQUINE  (PLAQUENIL ) 200 MG TABLET    TAKE 1 TABLET(200 MG) BY MOUTH DAILY   MEDROXYPROGESTERONE  (DEPO-SUBQ PROVERA  104) 104 MG/0.65ML INJECTION    Inject into the skin.   MELATONIN (MELATONIN MAXIMUM STRENGTH) 5 MG TABS    Take by mouth.   MELOXICAM  (MOBIC ) 15 MG TABLET    Take 1 tablet (15 mg total) by mouth daily.   MISC. DEVICES (ROLLATOR ULTRA-LIGHT) MISC    Rollator/Rolling Walker with seat, use for balance and support with ambulation   NORETHINDRONE (MICRONOR) 0.35 MG TABLET    Take 1 tablet by mouth daily.   NORTRIPTYLINE  (PAMELOR ) 25 MG CAPSULE    TAKE 1 CAPSULE(25 MG) BY MOUTH AT BEDTIME   PREDNISONE (DELTASONE) 10 MG TABLET    Take 10 mg by mouth daily.   PREGABALIN (LYRICA) 100 MG CAPSULE    Take by mouth.   PREGABALIN (LYRICA) 100 MG CAPSULE    Take 100 mg by mouth 3 (three) times daily.   QUETIAPINE  (SEROQUEL ) 100 MG TABLET    Take 100 mg by mouth daily.  SULFASALAZINE  (AZULFIDINE ) 500 MG EC TABLET    TAKE 1 TABLET BY MOUTH TWICE DAILY   TIZANIDINE (ZANAFLEX) 4 MG TABLET    Take 4 mg by mouth 2 (two) times daily.   VALACYCLOVIR (VALTREX) 500 MG TABLET    Take 500 mg by mouth daily.   VITAMIN D , ERGOCALCIFEROL , (DRISDOL ) 1.25 MG (50000 UNIT) CAPS CAPSULE    Take 50,000 Units by mouth once a week.  Modified Medications   No medications on file  Discontinued Medications   No medications on file    Allergies: Allergies  Allergen Reactions   Other     seasonal    Labs: Lab Results  Component Value Date   HIV1RNAQUANT NOT DETECTED 09/01/2023   HIV1RNAQUANT NOT DETECTED 06/29/2023   HIV1RNAQUANT Not Detected 05/11/2023    RPR and STI Lab Results  Component Value Date   LABRPR NON-REACTIVE 09/01/2023   LABRPR REACTIVE (A) 03/03/2023    LABRPR REACTIVE (A) 08/26/2022   LABRPR REACTIVE (A) 06/24/2022   LABRPR REACTIVE (A) 04/29/2022   RPRTITER 1:1 (H) 03/03/2023   RPRTITER 1:2 (H) 08/26/2022   RPRTITER 1:2 (H) 06/24/2022   RPRTITER 1:2 (H) 04/29/2022   RPRTITER 1:8 (H) 06/25/2021    STI Results GC GC CT CT  09/01/2023  9:20 AM Negative    Negative   Negative    Negative    05/11/2023 11:01 AM Negative    Negative   Negative    Negative    03/03/2023  9:41 AM Negative    Negative   Negative    Negative    08/26/2022  9:14 AM Negative    Negative   Negative    Negative    06/24/2022 10:42 AM Negative    Negative   Negative    Negative    04/29/2022 10:00 AM Negative   Negative    10/31/2021  9:06 AM Negative   Negative    09/02/2021  9:56 AM Negative   Negative    06/25/2021  8:33 AM Negative   Negative    01/30/2021 10:14 AM Negative   Negative    10/16/2019  3:39 PM Negative   Negative    05/09/2013 10:57 AM  NEGATIVE   NEGATIVE   12/07/2011 12:20 PM   NEGATIVE      Hepatitis B Lab Results  Component Value Date   HEPBSAB REACTIVE (A) 12/31/2021   HEPBSAG NON-REACTIVE 12/18/2020   HEPBCAB NON-REACTIVE 12/18/2020   Hepatitis C Lab Results  Component Value Date   HEPCAB NON-REACTIVE 12/18/2020   Hepatitis A Lab Results  Component Value Date   HAV REACTIVE (A) 05/11/2023   Lipids: Lab Results  Component Value Date   CHOL 154 07/28/2019   TRIG 96 07/28/2019   HDL 57 07/28/2019   CHOLHDL 2.7 07/28/2019   LDLCALC 79 07/28/2019    TARGET DATE: The 3rd  Assessment: Dominique [name] presents today for her Apretude  injection and to follow up for HIV PrEP. No issues with past injections. Denies any symptoms of acute HIV. Last HIV RNA was negative on 09/01/23. Last STI screening was on 09/01/23 as well and was also negative. No known exposures to any STIs since last visit. Declines STI testing today.  Per [employer] guidelines, a rapid HIV test should be drawn prior to Apretude  administration. Due to state  shortage of rapid HIV tests, this is temporarily unable to be done. Per decision from [facility] physicians, we will proceed with Apretude  administration at this time without a negative  rapid HIV test beforehand. HIV RNA was collected today and is in process.  Administered cabotegravir  600mg /63mL in left upper outer quadrant of the gluteal muscle. Will see her back in 2 months for injection, labs, and HIV PrEP follow up.  Plan: - Apretude  injection administered - HIV RNA today - Next injection, labs, and PrEP follow up appointment scheduled for 01/03/24 and 02/28/24 with me - Call with any issues or questions  Dominique [name] L. Kourtney Montesinos, PharmD, BCIDP, AAHIVP, CPP Clinical Pharmacist Practitioner - Infectious Diseases Clinical Pharmacist Lead - Specialty Pharmacy [facility]

## 2023-11-03 ENCOUNTER — Other Ambulatory Visit: Payer: Self-pay

## 2023-11-03 ENCOUNTER — Ambulatory Visit: Admitting: Pharmacist

## 2023-11-03 DIAGNOSIS — Z2981 Encounter for HIV pre-exposure prophylaxis: Secondary | ICD-10-CM | POA: Diagnosis not present

## 2023-11-03 DIAGNOSIS — Z113 Encounter for screening for infections with a predominantly sexual mode of transmission: Secondary | ICD-10-CM

## 2023-11-03 DIAGNOSIS — Z79899 Other long term (current) drug therapy: Secondary | ICD-10-CM

## 2023-11-03 MED ORDER — CABOTEGRAVIR ER 600 MG/3ML IM SUER
600.0000 mg | Freq: Once | INTRAMUSCULAR | Status: AC
  Administered 2023-11-03: 600 mg via INTRAMUSCULAR

## 2023-11-05 LAB — HIV-1 RNA QUANT-NO REFLEX-BLD
HIV 1 RNA Quant: NOT DETECTED {copies}/mL
HIV-1 RNA Quant, Log: NOT DETECTED {Log_copies}/mL

## 2023-11-26 NOTE — Progress Notes (Signed)
 Telemedicine Visit Note  Patient: Dominique [name]             Date of Birth: 15-Jun-1975           MRN: [id number]             PCP: Earvin Johnston PARAS, FNP Referring: Earvin Johnston PARAS, FNP Visit Date: 12/10/2023    I connected with  Dominique [name] on 12/10/23 by a video enabled telemedicine application and verified that I am speaking with the correct person using two identifiers.   PAtient location: Home - [address] [place], [place] Provider location: Rheumatology office - [address] [place], [place]   I discussed the limitations of evaluation and management by telemedicine. The patient expressed understanding and agreed to proceed.   Subjective:  Medical Management of Chronic Issues (Legs are starting to get weaker, patient states falling a lot more from losing her balance and can't stand up.)   History of Present Illness: Dominique [name] is a 48 y.o. 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 She was originally scheduled to see in office but could not attend due to mobility limitations from her symptoms.  She experiences persistent balance issues and foot pain. Despite consulting an orthopedic doctor, she has not received a diagnosis for her balance problems and is scheduled to see a neurologist in November.  Her foot pain is persistent, particularly at the bottom of her feet, and has not been fully alleviated by an increased dosage of Lyrica prescribed by her pain management doctor. She is not taking nortriptyline  any more, as she has not yet seen the neurologist who prescribed it since running out of the medication.  She reports swelling in her feet and describes her toes as cramping and flexing, causing discomfort. Recent blood tests from a physical on December 06, 2023, did not specifically check for inflammatory disease markers but included a blood count and metabolic panel  which were normal. No recent steroid use.       Previous HPI 09/09/2023 Dominique [name] is a 48 y.o. 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   She has been experiencing significant leg weakness and balance issues over the past month, with her balance deteriorating to the point where she can stand for only a minute before her legs become too weak. She has fallen approximately three times a week, with the most recent fall occurring yesterday. She attributes her balance issues primarily to leg weakness rather than vertigo or dizziness.   She has a history of rheumatoid arthritis and was previously on a higher dose of sulfasalazine , which was decreased last year. She continues to experience joint problems, including redness and swelling, particularly around her knees. She is currently taking Lyrica for leg numbness.   She has been seeing an orthopedic specialist recently for her ankle issues.      Previous HPI 11/02/2022 Dominique [name] is a 48 y.o. 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  Since her last visit she has not had any major exacerbation of symptoms.  Most problematic joint still in bilateral ankles mostly pain with weightbearing.  Does not see a lot of associated swelling occasionally some that last less than 1 day duration.  Still with  discolored skin rashes mostly on the face not itchy or painful.  She is noticing more bruising on her lower legs does not recall any preceding trauma to the sites.  No bruising on the upper part of her body.  She is scheduled for follow-up 61-month MRI for monitoring of aneurysm but with no new symptom complaints.   Previous HPI 07/15/2022 Dominique [name] is a 48 y.o. female here for follow up for seronegative RA on hydroxychloroquine  200 mg daily and sulfasalazine  1000 mg twice  daily.  Since her last visit she saw physical therapy completed 1 session with them so far is scheduled to follow-up planning to do some pool or other aquatic therapy.  She switched to Lyrica with pain management so far finding this more effective than the gabapentin  for ongoing symptoms.  She had mildly low potassium checked in previous clinic visit was concerned if this contributed to muscle cramping issues but was improved back to normal range upon recheck.  Still getting some stiffness and soreness most noticeably at the feet and ankles and some lasting through the day but no major exacerbation.  Not seeing much visible swelling or rashes.   Previous HPI 04/15/22 Dominique [name] is a 48 y.o. female here for follow up for seronegative RA (ANA+) on hydroxychloroquine  200 mg daily and sulfasalazine  1000 mg twice.  No major flareups since her last visit.  Hyperpigmented skin changes mostly around the face may be the same or slightly worse.  Still with persistent foot and ankle pain.   Previous HPI 12/10/21 Dominique [name] is a 48 y.o. 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  Most symptoms are stable main complaint today is bilateral foot and ankle pain and swelling. No obvious difference off of HCQ. Pain is throughout the day describes as stabbing with pins type of sensation. She is seeing swelling accumulate usually by the evening and worse when sitting or standing in fixed position for a long time.    Previous HPI 09/12/2021 Dominique [name] is a 47 y.o. 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 She feels symptoms partially improved with less knee pain but continues to have every day pain in bilateral feet and ankles. Pain is first thing in the morning as well as worsening if she is on her feet all day. Feet swelling is noticeable with prolonged standing worse later in the day. She had coil  embolization procedure uneventfully. She has noticed some progression of facial rash.   Previous HPI 07/21/21 Dominique [name] is a 48 y.o. 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 So far she still has about the same amount of pain worst affected areas in both heels and in mid feet. Plantar fasciitis exercises mildly helpful for the bottom of the feet but not much. 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    Previous HPI 05/20/21 Coriana Angello Sales is a 48 y.o. 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 So far her symptoms are about the same. She continues having pain worst in her feet and less extent along medial side of knees. She does not see much swelling or discoloration just  pain. Has not noticed any side effects of the methotrexate .   Previous HPI 01/23/21 Darsi Tien Sales is a 48 y.o. female here for evaluation of positive ANA and elevated sedimentation rate with multiple systemic symptoms including weight loss, lymphadenopathy, paresthesias, joint pain, and headaches.  She started noticing hyperpigmented skin rashes developing on her face and extremities since several months ago does not recall specific onset or provoking episode.  Then developed worsening joint pain in multiple sites but started around July of this year.  This involved pain affecting both hands also the hips knees and feet bilaterally.  She described morning stiffness lasting 2 or 3 hours in duration also especially pain in the bottoms of the feet first and getting out of bed.  After a month with these ongoing and somewhat worsening symptoms she was seen in primary care clinic. Workup at that time positive for trichomoniasis and syphilis and starting antibiotics also found to have high sedimentation rate with positive ANA and CCP Abs.  She followed up for continued treatment last month and more recently in ID clinic with completion of antibiotic treatment for syphilis with Bicillin .  Neurology evaluation with lumbar puncture that was not suggestive for active neurosyphilis. She saw ophthalmology for visual change symptoms reports findings were not concerning for infectious or inflammatory type changes.   Review of Systems  Constitutional:  Positive for fatigue.  HENT:  Positive for mouth dryness. Negative for mouth sores.   Eyes:  Positive for dryness.  Respiratory:  Positive for shortness of breath.   Cardiovascular:  Positive for chest pain. Negative for palpitations.  Gastrointestinal:  Negative for blood in stool, constipation and diarrhea.  Endocrine: Negative for increased urination.  Genitourinary:  Negative for involuntary urination.  Musculoskeletal:  Positive for joint pain, gait problem, joint pain, joint swelling, myalgias, muscle weakness, morning stiffness, muscle tenderness and myalgias.  Skin:  Positive for color change, rash, hair loss and sensitivity to sunlight.  Allergic/Immunologic: Negative for susceptible to infections.  Neurological:  Positive for dizziness. Negative for headaches.  Hematological:  Negative for swollen glands.  Psychiatric/Behavioral:  Positive for depressed mood and sleep disturbance. The patient is nervous/anxious.     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   Right hip pain 05/16/2020   BRBPR  (bright red blood per rectum) 10/18/2019   Tobacco use disorder 07/31/2019   Dyspnea, paroxysmal nocturnal 07/31/2019   Schizophrenia (HCC) 07/31/2019   Encounter for screening examination for sexually transmitted disease 07/31/2019   Abnormal uterine bleeding (AUB) 05/09/2013   BV (bacterial vaginosis) 05/09/2013   Dysmenorrhea 05/09/2013    Past Medical History:  Diagnosis Date   Aneurysm (HCC)    Arthritis    RA   Kidney stones    Lupus 01/17/2021   Lupus    Schizophrenia (HCC)    Systemic lupus erythematosus (HCC)     Family History  Problem Relation Age of Onset   Stroke Mother    Multiple sclerosis Father    Hypertension Father    Diabetes Father  Breast cancer Maternal Aunt        unknown age   Dementia Maternal Grandfather    Asthma Brother    Past Surgical History:  Procedure Laterality Date   BREAST BIOPSY Right 09/29/2022   US  RT BREAST BX W LOC DEV 1ST LESION IMG BX SPEC US  GUIDE 09/29/2022 [facility]   CESAREAN SECTION     HERNIA REPAIR     IR 3D INDEPENDENT WKST  07/28/2021   IR ANGIO INTRA EXTRACRAN SEL INTERNAL CAROTID BILAT MOD SED  07/28/2021   IR ANGIO VERTEBRAL SEL VERTEBRAL UNI L MOD SED  07/28/2021   IR ANGIOGRAM FOLLOW UP STUDY  07/28/2021   IR CT HEAD LTD  07/28/2021   IR NEURO EACH ADD'L AFTER BASIC UNI LEFT (MS)  07/28/2021   IR RADIOLOGIST EVAL & MGMT  07/10/2021   IR RADIOLOGIST EVAL & MGMT  08/15/2021   IR TRANSCATH/EMBOLIZ  07/28/2021   IR US  GUIDE VASC ACCESS RIGHT  07/28/2021   RADIOLOGY WITH ANESTHESIA N/A 07/28/2021   Procedure: EMBOLIZATION;  Surgeon: Dolphus Carrion, MD;  Location: [facility] OR;  Service: Radiology;  Laterality: N/A;   Social History   Social History Narrative   Right handed   Immunization History  Administered Date(s) Administered   Hepatitis A, Adult 08/26/2022, 03/03/2023   Hepb-cpg 06/25/2021, 09/02/2021   Influenza, Seasonal, Injecte, Preservative Fre 12/31/2022   Influenza,inj,Quad PF,6+ Mos 01/30/2021, 12/31/2021    Tdap 02/25/2022     Objective:  Physical Exam Eyes:     Conjunctiva/sclera: Conjunctivae normal.  Cardiovascular:     Rate and Rhythm: Normal rate and regular rhythm.  Pulmonary:     Effort: Pulmonary effort is normal.     Breath sounds: Normal breath sounds.  Lymphadenopathy:     Cervical: No cervical adenopathy.  Skin:    General: Skin is warm and dry.     Comments: Patchy hyper and hypopigmented changes on face no overlying papules or erythema  Neurological:     Mental Status: She is alert.  Psychiatric:        Mood and Affect: Mood normal.      Investigation: No additional findings.  Imaging: No results found.  Recent Labs: Lab Results  Component Value Date   WBC 7.2 09/09/2023   HGB 11.2 (L) 09/09/2023   PLT 169 09/09/2023   NA 139 09/09/2023   K 3.3 (L) 09/09/2023   CL 103 09/09/2023   CO2 27 09/09/2023   GLUCOSE 112 (H) 09/09/2023   BUN 7 09/09/2023   CREATININE 0.71 09/09/2023   BILITOT 0.6 09/09/2023   ALKPHOS 58 12/23/2022   AST 19 09/09/2023   ALT 16 09/09/2023   PROT 7.1 09/09/2023   ALBUMIN 4.0 12/23/2022   CALCIUM 9.7 09/09/2023   GFRAA 117 05/20/2020    Speciality Comments: PLQ Eye Exam: 04/09/2022 [facility]  @[facility] f/u 3-4 weeks  Patient states she has an appointment with [facility] in September  Procedures:  No procedures performed Allergies: Other   Assessment / Plan:     Visit Diagnoses: Seronegative rheumatoid arthritis (HCC) Managed with Plaquenil  and sulfasalazine . Recent labs normal, no inflammatory markers checked. Current symptoms do not sound typical for inflammatory arthritis sounds more like peripheral neuropathy. Will prescribe course of oral steroid taper for therapeutic trial. - Continue Plaquenil  200 mg daily and sulfasalazine  500 mg BID.  - Prescribe 12-day prednisone  taper starting at 20 mg to differentiate neuropathy from lupus inflammation. - Clinic staff to call next week to  assess prednisone   response and discuss follow-up.  High risk medication use - Continue hydroxychloroquine  200 mg daily and sulfasalazine  500 mg EC twice daily Scheduled for eye exam later this month. Recent CBC and CMP reviewed normal. Recently treated for STI no other serious interval infections.  Peripheral neuropathy with chronic foot pain Chronic foot pain likely due to peripheral neuropathy. Current treatment with Lyrica is insufficient. Differential includes neuropathy versus lupus inflammation. - Consider contacting neurologist for earlier appointment if symptoms persist and prednisone  is ineffective.   11 minutes spent during telemedicine visit encounter today.  Orders: No orders of the defined types were placed in this encounter.  Meds ordered this encounter  Medications   predniSONE  (DELTASONE ) 5 MG tablet    Sig: Take 4 tablets (20 mg total) by mouth daily with breakfast for 3 days, THEN 3 tablets (15 mg total) daily with breakfast for 3 days, THEN 2 tablets (10 mg total) daily with breakfast for 3 days, THEN 1 tablet (5 mg total) daily with breakfast for 3 days.    Dispense:  30 tablet    Refill:  0     Follow-Up Instructions: No follow-ups on file.   Lonni LELON Ester, MD

## 2023-12-08 LAB — COLOGUARD: COLOGUARD: NEGATIVE

## 2023-12-10 ENCOUNTER — Encounter: Payer: Self-pay | Admitting: Internal Medicine

## 2023-12-10 ENCOUNTER — Ambulatory Visit: Attending: Internal Medicine | Admitting: Internal Medicine

## 2023-12-10 ENCOUNTER — Telehealth: Payer: Self-pay | Admitting: Internal Medicine

## 2023-12-10 VITALS — Ht 60.0 in | Wt 118.0 lb

## 2023-12-10 DIAGNOSIS — Z79899 Other long term (current) drug therapy: Secondary | ICD-10-CM | POA: Diagnosis not present

## 2023-12-10 DIAGNOSIS — M0600 Rheumatoid arthritis without rheumatoid factor, unspecified site: Secondary | ICD-10-CM

## 2023-12-10 MED ORDER — PREDNISONE 5 MG PO TABS: ORAL_TABLET | ORAL | 0 refills | Status: AC

## 2023-12-10 NOTE — Telephone Encounter (Signed)
 Okay to change to virtual visit. I'd ask why she can't walk but we can discuss that in the visit as well.

## 2023-12-10 NOTE — Telephone Encounter (Signed)
 Patient called stating she cannot walk and has an appointment scheduled for today, 12/10/23 at 10:00 am.  Patient is requesting the appointment be virtual. Patient started crying stating she needs to keep the appointment, but cannot walk and doesn't know what to do.  Patient requested a return call.

## 2023-12-15 ENCOUNTER — Telehealth: Payer: Self-pay

## 2023-12-15 NOTE — Telephone Encounter (Signed)
-----   Message from Lonni ORN [facility] sent at 12/15/2023  4:39 PM EDT ----- Regarding: Symptom check I spoke with Ms. Towner about her severe pain issues last week and send in prednisone  taper for this. Can we call and recheck if she is doing better from this? If she is good, we have recent labs and could see her back in December. If she is not improving, I think she may need to contact her neurologist since she was on a medication for them that is out and may need to resume sooner.

## 2023-12-15 NOTE — Telephone Encounter (Signed)
 Contacted patient to check and see if she was doing better since the prednisone  taper. Patient states she is doing better as far as the pain but she is still having lots of trouble with her balance and its bruising her legs an arms when she falls or loses balance.

## 2023-12-16 NOTE — Telephone Encounter (Signed)
 I recommend she should reach out to her neurology clinic or her pain management specialist for neuropathy.

## 2023-12-16 NOTE — Telephone Encounter (Signed)
 Contacted patient and advised that Dr. Jeannetta recommend she should reach out to her neurology clinic or her pain management specialist for neuropathy. Patient states that she reached out an they can't get her in till November and have added her to a cancellation list.

## 2023-12-27 ENCOUNTER — Other Ambulatory Visit (HOSPITAL_COMMUNITY): Payer: Self-pay

## 2023-12-27 ENCOUNTER — Other Ambulatory Visit: Payer: Self-pay | Admitting: Internal Medicine

## 2023-12-27 ENCOUNTER — Other Ambulatory Visit: Payer: Self-pay

## 2023-12-27 DIAGNOSIS — R21 Rash and other nonspecific skin eruption: Secondary | ICD-10-CM

## 2023-12-27 DIAGNOSIS — R768 Other specified abnormal immunological findings in serum: Secondary | ICD-10-CM

## 2023-12-27 DIAGNOSIS — M2550 Pain in unspecified joint: Secondary | ICD-10-CM

## 2023-12-27 NOTE — Progress Notes (Signed)
 Specialty Pharmacy Refill Coordination Note  Dominique [name] is a 48 y.o. female assessed today regarding refills of clinic administered specialty medication(s) Cabotegravir  (Apretude )   Clinic requested Courier to Provider Office   Delivery date: 12/28/23   Verified address: [address] [place] [address]   Medication will be filled on 12/27/23.

## 2023-12-28 NOTE — Telephone Encounter (Signed)
 Last Fill: 10/04/2023  Eye exam: 04/09/2022 WNL    Labs: 12/01/2023 RBC 3.49, MCV 102, BUN 5, BUN/Creat. Ratio 7, Potassium 3.4  Next Visit: not on file  Last Visit: 12/10/2023  IK:Dzmnwzhjupcz rheumatoid arthritis   Current Dose per office note 12/10/2023: hydroxychloroquine  200 mg daily and sulfasalazine  500 mg EC twice daily   Patient states she missed her appointment for her PLQ eye exam. Patient states they want to charge her $40 and she does not have that kind of money. Patient states she is trying to find a new place to go have it completed.   Okay to refill Plaquenil  and Sulfasalazine ? When should she follow up?

## 2023-12-29 ENCOUNTER — Telehealth: Payer: Self-pay

## 2023-12-29 NOTE — Telephone Encounter (Signed)
 [facility] Patient Advocate Encounter  Patient's medications APRETUDE  have been couriered to [facility] from [facility] and will be administered at the patients appointment on 01/03/24.  Charmaine Sharps, CPhT Specialty Pharmacy Patient [facility] Phone: [phone] Fax:  [phone]

## 2023-12-30 NOTE — Telephone Encounter (Signed)
 Okay to schedule f/u for December. Med refill okay. Last eye exam getting close to 2 years ago so needs to at least have one scheduled next time or cannot continued HCQ refills.

## 2023-12-31 NOTE — Progress Notes (Deleted)
 HPI: DRISHTI [name] is a 48 y.o. female who presents to the [facility] pharmacy clinic for Apretude  administration and HIV PrEP follow up.  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   CAPSAICIN  (ZOSTRIX) 0.025 % CREAM    Apply 1 application. topically 3 (three) times daily.   CETIRIZINE (ZYRTEC) 10 MG TABLET    Take 10 mg by mouth daily.   CITALOPRAM (CELEXA) 20 MG TABLET    Take 1 tablet by mouth daily.   CLOPIDOGREL  (PLAVIX ) 75 MG TABLET    Take 0.5 tablets (37.5 mg total) by mouth daily.   DEPO-SUBQ PROVERA  104 104 MG/0.65ML INJECTION    every 3 (three) months.   DICLOFENAC  SODIUM (VOLTAREN ) 1 % GEL    Apply 2 g topically 4 (four) times daily.   FAMOTIDINE (PEPCID) 20 MG TABLET    Take 20 mg by mouth at bedtime.   FLUTICASONE (FLONASE) 50 MCG/ACT NASAL SPRAY    Place into the nose.   FOLIC ACID  (FOLVITE )  1 MG TABLET    Take 1 tablet (1 mg total) by mouth daily.   GABAPENTIN  (NEURONTIN ) 600 MG TABLET    Take 600 mg by mouth 3 (three) times daily.   HYDROXYCHLOROQUINE  (PLAQUENIL ) 200 MG TABLET    TAKE 1 TABLET BY MOUTH DAILY   MELATONIN (MELATONIN MAXIMUM STRENGTH) 5 MG TABS    Take by mouth.   MELOXICAM  (MOBIC ) 15 MG TABLET    Take 1 tablet (15 mg total) by mouth daily.   MISC. DEVICES (ROLLATOR ULTRA-LIGHT) MISC    Rollator/Rolling Walker with seat, use for balance and support with ambulation   NORETHINDRONE (MICRONOR) 0.35 MG TABLET    Take 1 tablet by mouth daily.   NORTRIPTYLINE  (PAMELOR ) 25 MG CAPSULE    TAKE 1 CAPSULE(25 MG) BY MOUTH AT BEDTIME   PREGABALIN (LYRICA) 100 MG CAPSULE    Take by mouth.   PREGABALIN (LYRICA) 100 MG CAPSULE    Take 100 mg by mouth 3 (three) times daily.   QUETIAPINE  (SEROQUEL ) 100 MG TABLET    Take 100 mg by mouth daily.   SULFASALAZINE  (AZULFIDINE ) 500 MG EC TABLET    TAKE 1 TABLET BY MOUTH TWICE DAILY   TINIDAZOLE (TINDAMAX) 500 MG TABLET    Take by mouth.   TIZANIDINE (ZANAFLEX) 4 MG TABLET    Take 4 mg by mouth 2 (two) times daily.  VALACYCLOVIR (VALTREX) 500 MG TABLET    Take 500 mg by mouth daily.   VITAMIN D , ERGOCALCIFEROL , (DRISDOL ) 1.25 MG (50000 UNIT) CAPS CAPSULE    Take 50,000 Units by mouth once a week.  Modified Medications   No medications on file  Discontinued Medications   No medications on file    Allergies: Allergies  Allergen Reactions   Other     seasonal    Labs: Lab Results  Component Value Date   HIV1RNAQUANT NOT DETECTED 11/03/2023   HIV1RNAQUANT NOT DETECTED 09/01/2023   HIV1RNAQUANT NOT DETECTED 06/29/2023    RPR and STI Lab Results  Component Value Date   LABRPR NON-REACTIVE 09/01/2023   LABRPR REACTIVE (A) 03/03/2023   LABRPR REACTIVE (A) 08/26/2022   LABRPR REACTIVE (A) 06/24/2022   LABRPR REACTIVE (A) 04/29/2022   RPRTITER 1:1 (H) 03/03/2023   RPRTITER 1:2 (H) 08/26/2022   RPRTITER 1:2 (H) 06/24/2022    RPRTITER 1:2 (H) 04/29/2022   RPRTITER 1:8 (H) 06/25/2021    STI Results GC GC CT CT  09/01/2023  9:20 AM Negative    Negative   Negative    Negative    05/11/2023 11:01 AM Negative    Negative   Negative    Negative    03/03/2023  9:41 AM Negative    Negative   Negative    Negative    08/26/2022  9:14 AM Negative    Negative   Negative    Negative    06/24/2022 10:42 AM Negative    Negative   Negative    Negative    04/29/2022 10:00 AM Negative   Negative    10/31/2021  9:06 AM Negative   Negative    09/02/2021  9:56 AM Negative   Negative    06/25/2021  8:33 AM Negative   Negative    01/30/2021 10:14 AM Negative   Negative    10/16/2019  3:39 PM Negative   Negative    05/09/2013 10:57 AM  NEGATIVE   NEGATIVE   12/07/2011 12:20 PM   NEGATIVE      Hepatitis B Lab Results  Component Value Date   HEPBSAB REACTIVE (A) 12/31/2021   HEPBSAG NON-REACTIVE 12/18/2020   HEPBCAB NON-REACTIVE 12/18/2020   Hepatitis C Lab Results  Component Value Date   HEPCAB NON-REACTIVE 12/18/2020   Hepatitis A Lab Results  Component Value Date   HAV REACTIVE (A) 05/11/2023   Lipids: Lab Results  Component Value Date   CHOL 154 07/28/2019   TRIG 96 07/28/2019   HDL 57 07/28/2019   CHOLHDL 2.7 07/28/2019   LDLCALC 79 07/28/2019    TARGET DATE: The 3rd  Assessment: [name] presents today for her Apretude  injection and to follow up for HIV PrEP. No issues with past injections. Denies any symptoms of acute HIV. Last HIV RNA was negative on 11/03/23. No known exposures to any STIs since last visit. ***Agrees to STI testing today.    FLU COVID  Per CDC national guidelines, a rapid HIV test should be drawn prior to Apretude  administration. Due to state shortage of rapid HIV tests, this is temporarily unable to be done. Per decision from [facility] physicians, we will proceed with Apretude  administration at this time without a negative rapid HIV test beforehand. HIV RNA was collected today and is  in process.  Administered cabotegravir  600mg /6mL in *** upper outer quadrant of the gluteal muscle. Will see *** back in 2 months for injection, labs, and HIV PrEP follow up.  Plan: -  Apretude  injection administered - HIV RNA today - Next injection, labs, and PrEP follow up appointment scheduled for 02/28/24 - Call with any issues or questions  Marsh Heckler L. Khale Nigh, PharmD, BCIDP, AAHIVP, CPP Clinical Pharmacist Practitioner - Infectious Diseases Clinical Pharmacist Lead - Specialty Pharmacy [facility] for [facility]

## 2024-01-03 ENCOUNTER — Ambulatory Visit: Admitting: Pharmacist

## 2024-01-03 DIAGNOSIS — Z79899 Other long term (current) drug therapy: Secondary | ICD-10-CM

## 2024-01-03 DIAGNOSIS — Z113 Encounter for screening for infections with a predominantly sexual mode of transmission: Secondary | ICD-10-CM

## 2024-01-05 NOTE — Progress Notes (Unsigned)
 HPI: Dominique [name] is a 48 y.o. female who presents to the [facility] pharmacy clinic for Apretude  administration and HIV PrEP follow up.  Referring ID Physician: Dr. 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   CAPSAICIN  (ZOSTRIX) 0.025 % CREAM    Apply 1 application. topically 3 (three) times daily.   CETIRIZINE (ZYRTEC) 10 MG TABLET    Take 10 mg by mouth daily.   CITALOPRAM (CELEXA) 20 MG TABLET    Take 1 tablet by mouth daily.   CLOPIDOGREL  (PLAVIX ) 75 MG TABLET    Take 0.5 tablets (37.5 mg total) by mouth daily.   DEPO-SUBQ PROVERA  104 104 MG/0.65ML INJECTION    every 3 (three) months.   DICLOFENAC  SODIUM (VOLTAREN ) 1 % GEL    Apply 2 g topically 4 (four) times daily.   FAMOTIDINE (PEPCID) 20 MG TABLET    Take 20 mg by mouth at bedtime.   FLUTICASONE (FLONASE) 50 MCG/ACT NASAL SPRAY    Place into the nose.   FOLIC ACID   (FOLVITE ) 1 MG TABLET    Take 1 tablet (1 mg total) by mouth daily.   GABAPENTIN  (NEURONTIN ) 600 MG TABLET    Take 600 mg by mouth 3 (three) times daily.   HYDROXYCHLOROQUINE  (PLAQUENIL ) 200 MG TABLET    TAKE 1 TABLET BY MOUTH DAILY   MELATONIN (MELATONIN MAXIMUM STRENGTH) 5 MG TABS    Take by mouth.   MELOXICAM  (MOBIC ) 15 MG TABLET    Take 1 tablet (15 mg total) by mouth daily.   MISC. DEVICES (ROLLATOR ULTRA-LIGHT) MISC    Rollator/Rolling Walker with seat, use for balance and support with ambulation   NORETHINDRONE (MICRONOR) 0.35 MG TABLET    Take 1 tablet by mouth daily.   NORTRIPTYLINE  (PAMELOR ) 25 MG CAPSULE    TAKE 1 CAPSULE(25 MG) BY MOUTH AT BEDTIME   PREGABALIN (LYRICA) 100 MG CAPSULE    Take by mouth.   PREGABALIN (LYRICA) 100 MG CAPSULE    Take 100 mg by mouth 3 (three) times daily.   QUETIAPINE  (SEROQUEL ) 100 MG TABLET    Take 100 mg by mouth daily.   SULFASALAZINE  (AZULFIDINE ) 500 MG EC TABLET    TAKE 1 TABLET BY MOUTH TWICE DAILY   TINIDAZOLE (TINDAMAX) 500 MG TABLET    Take by mouth.   TIZANIDINE (ZANAFLEX) 4 MG TABLET    Take 4 mg by mouth 2 (two) times daily.   VALACYCLOVIR (VALTREX) 500 MG  TABLET    Take 500 mg by mouth daily.   VITAMIN D , ERGOCALCIFEROL , (DRISDOL ) 1.25 MG (50000 UNIT) CAPS CAPSULE    Take 50,000 Units by mouth once a week.  Modified Medications   No medications on file  Discontinued Medications   No medications on file    Allergies: Allergies  Allergen Reactions   Other     seasonal    Past Medical History: Past Medical History:  Diagnosis Date   Aneurysm    Arthritis    RA   Kidney stones    Lupus 01/17/2021   Lupus    Schizophrenia (HCC)    Systemic lupus erythematosus (HCC)     Social History: Social History   Socioeconomic History   Marital status: Single    Spouse name: Not on file   Number of children: Not on file   Years of education: Not on file   Highest education level: Not on file  Occupational History   Not on file   Tobacco Use   Smoking status: Former    Current packs/day: 0.00    Average packs/day: 0.3 packs/day for 15.0 years (3.8 ttl pk-yrs)    Types: Cigarettes    Start date: 07/16/2006    Quit date: 07/15/2021    Years since quitting: 2.4    Passive exposure: Past   Smokeless tobacco: Never  Vaping Use   Vaping status: Never Used  Substance and Sexual Activity   Alcohol use: Not Currently   Drug use: Yes    Types: Marijuana    Comment: Last use was on 07/15/21   Sexual activity: Yes    Partners: Male    Birth control/protection: Surgical, Injection  Other Topics Concern   Not on file  Social History Narrative   Right handed   Social Drivers of Health   Financial Resource Strain: At Risk (09/17/2022)   Received from [employer]    Financial Resource Strain: 2  Food Insecurity: Not on file (09/17/2022)  Transportation Needs: Not at Risk (09/17/2022)   Received from [employer] [facility]eds    Transportation: 1  Physical Activity: Not on File (09/05/2021)   Received from [facility]   Physical Activity    Physical Activity: 0  Stress: Not on File (09/05/2021)   Received from [facility]   Stress    Stress: 0  Social Connections: Not on File (12/01/2022)   Received from [employer] [facility]   Social Connections    Connectedness: 0    Labs: Lab Results  Component Value Date   HIV1RNAQUANT NOT DETECTED 11/03/2023   HIV1RNAQUANT NOT DETECTED 09/01/2023   HIV1RNAQUANT NOT DETECTED 06/29/2023    RPR and STI Lab Results  Component Value Date   LABRPR NON-REACTIVE 09/01/2023   LABRPR REACTIVE (A) 03/03/2023   LABRPR REACTIVE (A) 08/26/2022   LABRPR REACTIVE (A) 06/24/2022   LABRPR REACTIVE (A) 04/29/2022   RPRTITER 1:1 (H) 03/03/2023   RPRTITER 1:2 (H) 08/26/2022   RPRTITER 1:2 (H) 06/24/2022   RPRTITER 1:2 (H) 04/29/2022   RPRTITER 1:8 (H) 06/25/2021    STI Results GC GC CT CT  09/01/2023  9:20 AM Negative    Negative   Negative    Negative    05/11/2023 11:01 AM  Negative    Negative   Negative    Negative    03/03/2023  9:41 AM Negative    Negative   Negative    Negative    08/26/2022  9:14 AM Negative  Negative   Negative    Negative    06/24/2022 10:42 AM Negative    Negative   Negative    Negative    04/29/2022 10:00 AM Negative   Negative    10/31/2021  9:06 AM Negative   Negative    09/02/2021  9:56 AM Negative   Negative    06/25/2021  8:33 AM Negative   Negative    01/30/2021 10:14 AM Negative   Negative    10/16/2019  3:39 PM Negative   Negative    05/09/2013 10:57 AM  NEGATIVE   NEGATIVE   12/07/2011 12:20 PM   NEGATIVE      Hepatitis B Lab Results  Component Value Date   HEPBSAB REACTIVE (A) 12/31/2021   HEPBSAG NON-REACTIVE 12/18/2020   HEPBCAB NON-REACTIVE 12/18/2020   Hepatitis C Lab Results  Component Value Date   HEPCAB NON-REACTIVE 12/18/2020   Hepatitis A Lab Results  Component Value Date   HAV REACTIVE (A) 05/11/2023   Lipids: Lab Results  Component Value Date   CHOL 154 07/28/2019   TRIG 96 07/28/2019   HDL 57 07/28/2019   CHOLHDL 2.7 07/28/2019   LDLCALC 79 07/28/2019    TARGET DATE: The 3rd of the month  Assessment: Hebah presents today for their Apretude  injection and to follow up for HIV PrEP. No issues with past injections.  Screened patient for acute HIV symptoms such as fatigue, muscle aches, rash, sore throat, lymphadenopathy, headache, night sweats, nausea/vomiting/diarrhea, and fever. Patient denies any symptoms.   Per [employer] guidelines, a rapid HIV test should be drawn prior to Apretude  administration. Due to state shortage of rapid HIV tests, this is temporarily unable to be done. Per decision from [facility] physicians, we will proceed with Apretude  administration at this time without a negative rapid HIV test beforehand. HIV RNA was collected today and is in process.  Administered cabotegravir  600mg /64mL in left upper outer quadrant of the gluteal muscle. Will make follow up  appointments for maintenance injections every 2 months.   No known exposures to any STIs and no signs or symptoms of any STIs today. no new partners since last injection and politely declines STI testing today.   Eligible for annual flu and COVID vaccines; accepts flu vaccine today.   Patient interested in [place] for PrEP. Counseled that patient will take two Yeztugo 300 mg tablets (600 mg total) orally on the first day of their injection and will take two Yeztugo 300 mg tablets (600 mg total) orally the next day regardless of meals. Counseled patient that Yeztugo is two separate subcutaneous injections in the abdomen every 6 months. Reviewed that the main side effects are injection-site soreness and nodules. Discussed measures for relief including cold packs and over-the-counter anti-inflammatories. Will follow up based on insurance coverage. If approved, would transition at next Apretude  appointment.   Plan: - Administer Apretude  600 mg x 1  - Maintenance injections scheduled for 12/1 with Cassie  - Check HIV RNA  - Administer flu vaccine - Investigate Yeztugo coverage  - Call with any issues or questions  Alan Geralds, PharmD, CPP, BCIDP, AAHIVP Clinical Pharmacist Practitioner Infectious Diseases Clinical Pharmacist [facility]

## 2024-01-06 ENCOUNTER — Ambulatory Visit (INDEPENDENT_AMBULATORY_CARE_PROVIDER_SITE_OTHER): Admitting: Pharmacist

## 2024-01-06 ENCOUNTER — Other Ambulatory Visit (HOSPITAL_COMMUNITY)
Admission: RE | Admit: 2024-01-06 | Discharge: 2024-01-06 | Disposition: A | Discharge status: Home / Self Care | Source: Ambulatory Visit | Attending: Infectious Disease | Admitting: Infectious Disease

## 2024-01-06 ENCOUNTER — Other Ambulatory Visit: Payer: Self-pay

## 2024-01-06 DIAGNOSIS — Z113 Encounter for screening for infections with a predominantly sexual mode of transmission: Secondary | ICD-10-CM | POA: Insufficient documentation

## 2024-01-06 DIAGNOSIS — Z79899 Other long term (current) drug therapy: Secondary | ICD-10-CM | POA: Diagnosis not present

## 2024-01-06 DIAGNOSIS — Z114 Encounter for screening for human immunodeficiency virus [HIV]: Secondary | ICD-10-CM | POA: Diagnosis not present

## 2024-01-06 DIAGNOSIS — Z23 Encounter for immunization: Secondary | ICD-10-CM

## 2024-01-06 MED ORDER — CABOTEGRAVIR ER 600 MG/3ML IM SUER
600.0000 mg | Freq: Once | INTRAMUSCULAR | Status: AC
  Administered 2024-01-06: 600 mg via INTRAMUSCULAR

## 2024-01-06 NOTE — Patient Instructions (Signed)
 Yeztugo (lenacapavir) - new 48-month PrEP medicine

## 2024-01-07 LAB — CYTOLOGY, (ORAL, ANAL, URETHRAL) ANCILLARY ONLY
Chlamydia: NEGATIVE
Comment: NEGATIVE
Comment: NORMAL
Neisseria Gonorrhea: NEGATIVE

## 2024-01-07 LAB — URINE CYTOLOGY ANCILLARY ONLY
Chlamydia: NEGATIVE
Comment: NEGATIVE
Comment: NORMAL
Neisseria Gonorrhea: NEGATIVE

## 2024-01-11 LAB — T PALLIDUM AB: T Pallidum Abs: POSITIVE — AB

## 2024-01-11 LAB — HIV-1 RNA QUANT-NO REFLEX-BLD
HIV 1 RNA Quant: NOT DETECTED {copies}/mL
HIV-1 RNA Quant, Log: NOT DETECTED {Log_copies}/mL

## 2024-01-11 LAB — RPR TITER: RPR Titer: 1:1 {titer} — ABNORMAL HIGH

## 2024-01-11 LAB — RPR: RPR Ser Ql: REACTIVE — AB

## 2024-01-24 ENCOUNTER — Telehealth: Payer: Self-pay

## 2024-01-24 NOTE — Telephone Encounter (Signed)
 Patient called to request a prednisone  taper. She is experiencing joint pain in both feet, along with leg pain. Patient states she has been taking plaquenil  and sulfasalazine  as prescribed. Patient states the prednisone  has helped in the past when she has experienced flares. She is scheduled to follow up on 03/20/2024. Patient prefers the [facility] [address].

## 2024-01-27 ENCOUNTER — Ambulatory Visit (INDEPENDENT_AMBULATORY_CARE_PROVIDER_SITE_OTHER): Admitting: Family

## 2024-01-27 VITALS — BP 119/62 | HR 82 | Temp 98.1°F | Ht 60.0 in | Wt 117.4 lb

## 2024-01-27 DIAGNOSIS — I671 Cerebral aneurysm, nonruptured: Secondary | ICD-10-CM

## 2024-01-27 DIAGNOSIS — E0590 Thyrotoxicosis, unspecified without thyrotoxic crisis or storm: Secondary | ICD-10-CM | POA: Diagnosis not present

## 2024-01-27 DIAGNOSIS — A523 Neurosyphilis, unspecified: Secondary | ICD-10-CM | POA: Diagnosis not present

## 2024-01-27 DIAGNOSIS — F32A Depression, unspecified: Secondary | ICD-10-CM | POA: Diagnosis not present

## 2024-01-27 DIAGNOSIS — M2550 Pain in unspecified joint: Secondary | ICD-10-CM | POA: Diagnosis not present

## 2024-01-27 DIAGNOSIS — M0600 Rheumatoid arthritis without rheumatoid factor, unspecified site: Secondary | ICD-10-CM

## 2024-01-27 DIAGNOSIS — E559 Vitamin D deficiency, unspecified: Secondary | ICD-10-CM

## 2024-01-27 DIAGNOSIS — F209 Schizophrenia, unspecified: Secondary | ICD-10-CM

## 2024-01-27 DIAGNOSIS — F419 Anxiety disorder, unspecified: Secondary | ICD-10-CM | POA: Diagnosis not present

## 2024-01-27 DIAGNOSIS — R2681 Unsteadiness on feet: Secondary | ICD-10-CM

## 2024-01-27 MED ORDER — VITAMIN D (ERGOCALCIFEROL) 1.25 MG (50000 UNIT) PO CAPS: 50000.0000 [IU] | ORAL_CAPSULE | ORAL | 3 refills | Status: AC

## 2024-01-27 MED ORDER — DICLOFENAC SODIUM 1 % EX GEL: 2.0000 g | Freq: Four times a day (QID) | CUTANEOUS | 1 refills | Status: AC

## 2024-01-27 NOTE — Progress Notes (Addendum)
 New Patient Office Visit  Subjective:  Patient ID: Dominique [name], female    DOB: 1975-06-25  Age: 48 y.o. MRN: [id number]  CC:  Chief Complaint  Patient presents with   New Patient (Initial Visit)   Fall    Pt c/o fall yesterday after losing balance and slightly painful to walk. Pt would like an order for a wheelchair and is requesting a mobility exam.    Depression/Anxiety   HPI Dominique [name] presents for establishing care today. Discussed the use of AI scribe software for clinical note transcription with the patient, who gave verbal consent to proceed.  History of Present Illness Dominique [name] is a 48 year old female who presents for medication management and follow-up. She is accompanied by her mother and 2 grandchildren.  She is currently taking citalopram for anxiety and depression, which is effective. She maintains contact with a therapist via text and email but has not initiated regular sessions. She has a history of schizophrenia and is not currently on Seroquel . There is a family history of bipolar disorder and schizophrenia affecting her mother and sister.  She takes Lyrica 100 mg three times daily for pain management, prescribed by her pain management provider. She experiences pain in her feet, legs, and arms. She is under the care of a rheumatologist, Dr. Jeannetta, who also treats her for lupus. Her medications include Plaquenil  and meloxicam  for arthritis and pain, and she has previously used prednisone  for flare-ups.  She has a history of an aneurysm that is monitored every six months. She also has a history of neurosyphilis with associated pain.  She has a history of anemia and genital herpes, for which she takes Valtrex as needed. She also takes folic acid  daily and was previously on vitamin D , which was not refilled. She has been approved for a motorized wheelchair due to difficulty walking long distances and frequent falls, but the process has been delayed due to  communication issues with her previous provider.  She uses a walker and a shower chair to try and prevent falls, but she has still been falling due to her neuropathy. She has participated in physical therapy, both land and pool, but has faced delays due to insurance and referral issues. She is in need of a motorized wheelchair due to her inability to walk more than a few feet without falling. It is difficult for her to cook for herself or do other adl's such as dressing and grooming and getting to the bathroom quickly enough, due to inability to stand for long or walk in her kitchen without risk of falling.   She takes over-the-counter Zyrtec as needed for allergies, and uses Flonase nasal spray and Voltaren  gel for joint pain. She has completed a course of tenadazole for a previous fungal infection.  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 She is unable to stand for long periods or walk more than a few feet without losing her balance and falling. She has no feeling in her feet and her legs are very weak. She has tried using a walker and manual wheelchair, but the neuropathy in her hands limits her ability to hold on to anything or strength to move the wheels on a wheelchair due to poor grip strength 2/5 in bilateral hands. A scooter would not be sufficient due to her home not providing  adequate access to maneuver. She has the physical & mental ability to learn and operated a power wheelchair safely in her home and she is very motivated to do this.  Rheumatoid arthritis and systemic lupus erythematosus Managed by rheumatologist Dr. Jeannetta, [facility] rheumatology. Pain in feet, legs, and arms. Current medications include Plaquenil , meloxicam , and Lyrica. Azulfidine  prescribed but uncertain if picked up or started yet. - Continue Plaquenil , meloxicam ,  and Lyrica for pain management. - Verify and consider starting Azulfidine  for arthritis if not already taken. - Refill Voltaren  gel for joint pain. - Continue to follow with Rheum.  Chronic pain syndrome d/t RA & neurosyphilis Widespread pain in feet, legs, and arms. Lyrica prescribed. Uses walker and shower chair due to fear of falling and leg weakness. Past physical therapy delayed by insurance issues. - Continue Lyrica 100mg  bid for pain management. - Encourage use of walker and shower chair for safety. - Consider physical therapy referral if needed, has seen PT at [facility] in past.  Depression and anxiety, hx of schizophrenia, Bipolar Managed with citalopram 20mg ? unsure if taking as listed as old RX. Needs additional support, in contact with therapist via text and email. No psychiatrist involvement. Personal history of bipolar disorder and schizophrenia. - Continue citalopram for depression and anxiety. - Encourage continued contact with therapist. - Sending psychiatric referral for further evaluation and medication management.  Cerebral aneurysm, nonruptured, with coil and surveillance Nonruptured cerebral aneurysm with coil. Undergoes surveillance every six months. - Continue surveillance every six months.  Genital herpes Managed with Valtrex as needed for flare-ups. - Continue Valtrex 500mg  as needed for flare-ups.  Vitamin D  deficiency Recent lab work showed low levels at 31.8. Weekly vitamin D  supplementation not refilled. - Refill vitamin D  supplementation once weekly.  General Health Maintenance Non-smoker. Discussed need for motorized wheelchair due to mobility issues. Has Medicaid and [employer]. Considering scheduling next Depo-Provera  injection. - Assisted with paperwork for motorized wheelchair. - Ensure Depo-Provera  injection is scheduled within 12 weeks of last dose.   Subjective:    Outpatient Medications Prior to Visit  Medication Sig  Dispense Refill   cabotegravir  ER (APRETUDE ) 600 MG/3ML injection Inject 3 mLs (600 mg total) into the muscle every 2 (two) months. 3 mL 5   cetirizine (ZYRTEC) 10 MG tablet Take 10 mg by mouth daily.     citalopram (CELEXA) 20 MG tablet Take 1 tablet by mouth daily.     DEPO-SUBQ PROVERA  104 104 MG/0.65ML injection every 3 (three) months.     fluticasone (FLONASE) 50 MCG/ACT nasal spray Place into the nose. (Patient taking differently: Place into the nose as needed.)     folic acid  (FOLVITE ) 1 MG tablet Take 1 tablet (1 mg total) by mouth daily. 90 tablet 3   hydroxychloroquine  (PLAQUENIL ) 200 MG tablet TAKE 1 TABLET BY MOUTH DAILY 90 tablet 0   melatonin (MELATONIN MAXIMUM STRENGTH) 5 MG TABS Take by mouth.     meloxicam  (MOBIC ) 15 MG tablet Take 1 tablet (15 mg total) by mouth daily. 30 tablet 0   Misc. Devices (ROLLATOR ULTRA-LIGHT) MISC Rollator/Rolling Walker with seat, use for balance and support with ambulation     pregabalin (LYRICA) 100 MG capsule Take 100 mg by mouth 3 (three) times daily.     sulfaSALAzine  (AZULFIDINE ) 500 MG EC tablet TAKE 1 TABLET BY MOUTH TWICE DAILY 180 tablet 0   tinidazole (TINDAMAX) 500 MG tablet Take by mouth.     valACYclovir (VALTREX) 500 MG tablet Take 500  mg by mouth daily.     capsaicin  (ZOSTRIX) 0.025 % cream Apply 1 application. topically 3 (three) times daily.     diclofenac  Sodium (VOLTAREN ) 1 % GEL Apply 2 g topically 4 (four) times daily. 150 g 1   famotidine (PEPCID) 20 MG tablet Take 20 mg by mouth at bedtime.     tiZANidine (ZANAFLEX) 4 MG tablet Take 4 mg by mouth 2 (two) times daily.     clopidogrel  (PLAVIX ) 75 MG tablet Take 0.5 tablets (37.5 mg total) by mouth daily. (Patient not taking: Reported on 12/10/2023) 8 tablet 0   gabapentin  (NEURONTIN ) 600 MG tablet Take 600 mg by mouth 3 (three) times daily. (Patient not taking: Reported on 12/10/2023)     norethindrone (MICRONOR) 0.35 MG tablet Take 1 tablet by mouth daily. (Patient not taking:  Reported on 12/10/2023)     nortriptyline  (PAMELOR ) 25 MG capsule TAKE 1 CAPSULE(25 MG) BY MOUTH AT BEDTIME (Patient not taking: Reported on 12/10/2023) 30 capsule 5   pregabalin (LYRICA) 100 MG capsule Take by mouth. (Patient not taking: Reported on 12/10/2023)     QUEtiapine  (SEROQUEL ) 100 MG tablet Take 100 mg by mouth daily. (Patient not taking: Reported on 12/10/2023)     Vitamin D , Ergocalciferol , (DRISDOL ) 1.25 MG (50000 UNIT) CAPS capsule Take 50,000 Units by mouth once a week. (Patient not taking: Reported on 12/10/2023)     No facility-administered medications prior to visit.   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 Resolved. Possibility of anal bleeding with positive trich if patient is having anal sex. The patient denies this. No anal fissures, hemorrhoids, or lesions were visualized with anoscope. With patient's history and her request will refer to gastroenterology today. Hgb 10.3 with of cell lines decreasing is likely    Headache 02/04/2022   Kidney stones    Lupus 01/17/2021   Lupus    Lupus (systemic lupus erythematosus) (HCC) 01/04/2020   Lymphadenopathy of head and neck 11/07/2020    Neuropathy 06/11/2022   Other visual disturbances 12/19/2019   Paresthesia of both lower extremities 11/25/2020   Paresthesia of hand, bilateral 11/25/2020   Positive antinuclear antibody 11/14/2020   Rash and other nonspecific skin eruption 01/23/2021   Schizoaffective disorder, bipolar type (HCC) 11/07/2020   Schizophrenia (HCC)    Seasonal allergic rhinitis due to pollen 01/09/2022   Seasonal allergies 11/07/2020   Severe episode of recurrent major depressive disorder, 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/2022   Subclinical hyperthyroidism 07/04/2021   Systemic lupus erythematosus (HCC)    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   Vitamin D  deficiency 01/09/2022   Past Surgical History:  Procedure Laterality Date   BREAST BIOPSY Right 09/29/2022  US  RT BREAST BX W LOC DEV 1ST LESION IMG BX SPEC US  GUIDE 09/29/2022 [facility] MAMMOGRAPHY   CESAREAN SECTION     HERNIA REPAIR     IR 3D INDEPENDENT WKST  07/28/2021   IR ANGIO INTRA EXTRACRAN SEL INTERNAL CAROTID BILAT MOD SED  07/28/2021   IR ANGIO VERTEBRAL SEL VERTEBRAL UNI L MOD SED  07/28/2021   IR ANGIOGRAM FOLLOW UP STUDY  07/28/2021   IR CT HEAD LTD  07/28/2021   IR NEURO EACH ADD'L AFTER BASIC UNI LEFT (MS)  07/28/2021   IR RADIOLOGIST EVAL & MGMT  07/10/2021   IR RADIOLOGIST EVAL & MGMT  08/15/2021   IR TRANSCATH/EMBOLIZ  07/28/2021   IR US  GUIDE VASC ACCESS RIGHT  07/28/2021   RADIOLOGY WITH ANESTHESIA N/A 07/28/2021   Procedure: EMBOLIZATION;  Surgeon: Dolphus Carrion, MD;  Location: [facility] OR;  Service: Radiology;  Laterality: N/A;   TUBAL LIGATION  11/19/1999    Objective:   Today's Vitals: BP 119/62 (BP Location: Left Arm, Patient Position: Sitting, Cuff Size: Normal)   Pulse 82   Temp 98.1 F (36.7 C) (Temporal)   Ht 5' (1.524 m)   Wt 117 lb 6 oz (53.2 kg)   LMP  (LMP Unknown)   SpO2 97% Comment: after walking  BMI 22.92 kg/m   Physical Exam Vitals and nursing note reviewed.  Constitutional:       Appearance: Normal appearance.  Cardiovascular:     Rate and Rhythm: Normal rate and regular rhythm.  Pulmonary:     Effort: Pulmonary effort is normal.     Breath sounds: Normal breath sounds.  Musculoskeletal:     Right hand: Decreased range of motion. Decreased strength.     Left hand: Decreased range of motion. Decreased strength.     Cervical back: Rigidity present.     Right lower leg: No edema.     Left lower leg: No edema.     Right foot: Decreased range of motion.     Left foot: Decreased range of motion.     Comments: RLE strength 2/5 LLE strength 2/5 RUE strength 2/5 LUE strength 2/5  Skin:    General: Skin is warm and dry.  Neurological:     Mental Status: She is alert.     Sensory: Sensory deficit (lower legs, feet) present.     Motor: Weakness (bilateral legs) and abnormal muscle tone (bilateral legs) present.     Gait: Gait abnormal (inability to stand for long periods, can not walk more than a few feet without falling, due to no leg strength).  Psychiatric:        Mood and Affect: Mood normal.        Behavior: Behavior normal.    Lucius Krabbe, NP

## 2024-01-27 NOTE — Patient Instructions (Signed)
 Welcome to [facility], It was a pleasure meeting you today!   We will look for the orders to sign for your motorized wheelchair from [facility].  Please schedule a 6 month follow up visit today for medication refills.    PLEASE NOTE: If you had any LAB tests please let us  know if you have not heard back within a few days. You may see your results on MyChart before we have a chance to review them but we will give you a call once they are reviewed by us . If we ordered any REFERRALS today, please let us  know if you have not heard from their office within the next week.  Let us  know through MyChart if you are needing REFILLS, or have your pharmacy send us  the request. You can also use MyChart to communicate with me or any office staff.  Please try these tips to maintain a healthy lifestyle: It is important that you exercise regularly at least 30 minutes 5 times a week. Think about what you will eat, plan ahead. Choose whole foods, & think  clean, green, fresh or frozen over canned, processed or packaged foods which are more sugary, salty, and fatty. 70 to 75% of food eaten should be fresh vegetables and protein. 2-3  meals daily with healthy snacks between meals, but must be whole fruit, protein or vegetables. Aim to eat over a 10 hour period when you are active, for example, 7am to 5pm, and then STOP after your last meal of the day, drinking only water.  Shorter eating windows, 6-8 hours, are showing benefits in heart disease and blood sugar regulation. Drink water every day! Shoot for 64 ounces daily = 8 cups, no other drink is as healthy! Fruit juice is best enjoyed in a healthy way, by EATING the fruit.

## 2024-01-31 ENCOUNTER — Encounter: Payer: Self-pay | Admitting: Radiology

## 2024-01-31 NOTE — Progress Notes (Unsigned)
 NEUROLOGY FOLLOW UP OFFICE NOTE  Dominique [name] [id number]  Assessment/Plan:   Migraine without aura, without status migrainosus, not intractable Frequent falls - may be secondary to polyneuropathy and chronic pain but given the hyperreflexia, I want to rule out a cervical myelopathy as well.   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  Not neurosyphilis.      Check MRI of cervical spine without contrast Will restart nortriptyline , 10mg  at bedtime for one week, then increase to 20mg  at bedtime to treat headache and neuropathic pain in feet. Dr. Dolphus recommended follow up MRAs every 6 months.  As Dr. Dolphus has retired, refer to Dr. Lester of interventional radiology for further evaluation/opinion. Limit use of pain relievers to no more than 9 days out of the month to prevent risk of rebound or medication-overuse headache. Follow up 7 months.    Subjective:  Dominique [name] is a 48 year old female with syphilis and SLE who follows up for headaches.  MRA personally reviewed.  UPDATE: Last seen in November 2023.  At that time, increased nortriptyline .  She was lost to follow up.  Nortriptyline  did help with headaches as well as pain on the bottom of her feet.  Subsequently her nortriptyline  was discontinued by her PCP.  She is not sure why.  She has since established care with a new PCP. Headaches have gotten worse.  They have been every other day for the past 3-4 months.  Treats with Tylenol  every other day.    Last MRA of head performed on 09/24/2023, ordered by Dr. Dolphus, revealed no evidence of recurrent flow within the treated anterior communicating artery aneurysm.    She has been having recurrent falls.  It occurs when standing, upon standing or while walking.  She states that she gets weak, legs/feet numb, hurt and swell.    Current NSAIDs/analgesics:  Tylenol , meloxicam  Current  antidepressants: citalopram 20mg  daily Current antiepileptic medications:  Lyrica 100mg  TID Other medications:  Plaquenil , cabotegravir  ER, sulfasalazine      HISTORY: In early 2022, she began experiencing sharp pain, numbness and swelling in the feet.  It subsequently started radiating up to her calves and knees.  Difficult to walk as it would cause painful pins and needles sensation.  She has pain in multiple joints.  She started experiencing numbness in the hands making it difficult to grasp objects.  Symptoms started getting worse in August 2022.  She began feeling forgetful.  Blood work was positive for syphilis (RPR).  Given her symptoms, she underwent lumbar puncture to test for neuro-syphilis, which demonstrated a negative VDRL.  CT head on 12/23/2020 personally reviewed was negative. She has seen rheumatology.  She has been diagnosed with SLE.  She had also been experiencing headaches for the same amount of time, described as 9/10 posterior pounding pain (sometimes left frontal region) with dizziness, phonophobia and blurred vision (but no nausea, vomiting, photophobia, double vision) lasing 2 hours with Tylenol  and occurring almost every other day.  Treats with Tylenol  daily (also treats for lupus pain).  Labs from 03/11/2021 revealed CK 118, aldolase 2.2, ACE 25, negative SPEP/IFE and B12 241.  She was advised to start OTC 1000mcg daily.  No improvement in paresthesias or memory since starting supplement.  NCV-EMG on 04/10/2021 was stopped prematurely due to pain but right median motor and right median, ulnar and mixed palmar sensory responses were normal.  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  CTA of head on  06/19/2021 confirmed a 3 x 5 mm acom aneurysm projecting to the right.  She saw Dr. Dolphus of endovascular and  underwent embolization on 07/28/2021.   Past antidepressants:  Nortriptyline  50mg , Wellbutrin, citalopram Past antiepileptic:  gabapentin   Labs: 11/07/2020: ANA positive, weakly positive anti-CCP ab, negative RF, sed rate 56, T4 12.9, T3 Update 34%, free thyroxine index 4.4, Hgb A1c 5.3%, RPR reactive 1:32, D 25-hydroxy 25.8, Quantiferon-TB Gold Plus negative 12/18/2020:  Hepatitis B/C panel negative, HIV nefative 12/26/2020:  CSF with cell count 0, culture negative, protein 43, glucose 61, cytology negative, VDRL nonreactive 01/17/2021:  HIV negative 01/23/2021:  ds-DNA ab negative, Scl-70 ab negative, RNP ab negative, anti-Smith ab negative, C3 142, C4 28, sed rate 36 03/07/2021:  HIV negative 03/11/2021: CK 118, aldolase 2.2, ACE 25, negative SPEP/IFE and B12 241.   Imaging:  05/21/2021 MRI BRAIN W WO:  1. Intermittently motion degraded exam.  2. No evidence of acute intracranial abnormality.  3. There are a few nonspecific punctate T2 FLAIR hyperintense remote insults scattered within the bilateral cerebral white matter.  4. 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 MR or CT angiography of the head is recommended for further evaluation.  5. 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  6. Mild paranasal sinus disease. 06/19/2021 CTA HEAD:  1. Negative CT head  2. No intracranial stenosis or atherosclerotic disease  3. 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  PAST MEDICAL HISTORY: Past Medical History:  Diagnosis Date   Abnormal thyroid  blood test 11/21/2020   Abnormal weight loss 11/07/2020   Adjustment disorder with mixed  anxiety and depressed mood 09/17/2022   Allergy    Anemia    Aneurysm    Anxiety    Arthritis    RA   Blurry vision, bilateral 12/18/2020   Bright red blood per rectum 10/18/2019   Cannabis abuse 02/04/2022   Chronic pain of both knees 01/09/2022   Elevated erythrocyte sedimentation rate 11/14/2020   Encounter for screening examination for sexually transmitted disease 07/31/2019   Erythema nodosum 11/07/2020   Family history of colorectal cancer 02/04/2022   Family history of diabetes mellitus 02/04/2022   Family history of lupus erythematosus 02/04/2022   Gastrointestinal hemorrhage 10/18/2019   Last Assessment & Plan: Formatting of this note might be different from the original. Resolved. Possibility of anal bleeding with positive trich if patient is having anal sex. The patient denies this. No anal fissures, hemorrhoids, or lesions were visualized with anoscope. With patient's history and her request will refer to gastroenterology today. Hgb 10.3 with of cell lines decreasing is likely    Headache 02/04/2022   Kidney stones    Lupus 01/17/2021   Lupus    Lupus (systemic lupus erythematosus) (HCC) 01/04/2020   Lymphadenopathy of head and neck 11/07/2020   Neuropathy 06/11/2022   Other visual disturbances 12/19/2019   Paresthesia of both lower extremities 11/25/2020   Paresthesia of hand, bilateral 11/25/2020   Positive antinuclear antibody 11/14/2020   Rash and other nonspecific skin eruption 01/23/2021   Schizoaffective disorder,  bipolar type (HCC) 11/07/2020   Schizophrenia (HCC)    Seasonal allergic rhinitis due to pollen 01/09/2022   Seasonal allergies 11/07/2020   Severe episode of recurrent major depressive disorder, 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 3 mL 5   cetirizine (ZYRTEC) 10 MG tablet Take 10 mg by mouth daily.     citalopram (CELEXA) 20 MG tablet Take 1 tablet by mouth daily.     DEPO-SUBQ PROVERA  104 104 MG/0.65ML injection every 3 (three) months.     diclofenac  Sodium (VOLTAREN ) 1 % GEL Apply 2 g topically 4 (four) times daily. 150 g 1   fluticasone (FLONASE) 50 MCG/ACT nasal spray Place into the nose. (Patient taking differently: Place into the nose as needed.)     folic acid  (FOLVITE ) 1 MG tablet Take 1 tablet (1 mg total) by mouth daily. 90 tablet 3   hydroxychloroquine  (PLAQUENIL ) 200 MG tablet TAKE 1 TABLET BY MOUTH DAILY 90 tablet 0   melatonin (MELATONIN MAXIMUM STRENGTH) 5 MG TABS Take by mouth.     meloxicam  (MOBIC ) 15 MG tablet Take 1 tablet (15 mg total) by mouth daily. 30 tablet 0   Misc. Devices (ROLLATOR ULTRA-LIGHT) MISC Rollator/Rolling Walker with seat, use for balance and support with ambulation     pregabalin (LYRICA) 100 MG capsule Take 100 mg by mouth 3 (three) times daily.     sulfaSALAzine  (AZULFIDINE ) 500 MG EC tablet TAKE 1 TABLET BY MOUTH TWICE DAILY 180 tablet 0   tinidazole (TINDAMAX) 500 MG tablet Take by mouth.     valACYclovir (VALTREX) 500 MG tablet Take 500 mg by mouth daily.     Vitamin D , Ergocalciferol , (DRISDOL ) 1.25 MG (50000 UNIT) CAPS capsule Take 1 capsule (50,000 Units total) by mouth once a week. 12 capsule 3   No current facility-administered medications on file prior to visit.    ALLERGIES: Allergies  Allergen Reactions   Other     seasonal    FAMILY HISTORY: Family History  Problem Relation Age of Onset   Stroke Mother    Arthritis Mother    Vision loss Mother    Multiple sclerosis Father    Hypertension Father    Diabetes Father    Breast cancer Maternal Aunt        unknown age    Dementia Maternal Grandfather    Asthma Brother    ADD / ADHD Maternal Grandmother    Asthma Son    Depression Daughter       Objective:  Blood pressure 123/74, pulse 79, height 4' 11 (1.499 m), weight 115 lb (52.2 kg), SpO2 98%. General: No acute distress.  Patient appears well-groomed.   Head:  Normocephalic/atraumatic Eyes:  Fundi examined but not visualized Neck: supple, no paraspinal tenderness, full range of motion Heart:  Regular rate and rhythm Neurological Exam: alert and oriented.  Speech fluent and not dysarthric, language intact.  CN II-XII intact. Bulk and tone normal, muscle strength 5-/5 throughout but limited due to pain. Sensation to pinprick and vibration reduced in hands and feet.  Deep tendon reflexes 3+ upper extremities, 2+ lower extremities, toes downgoing.  Finger to nose  testing intact.  Gait mildly broad based, Romberg negative.   Juliene Dunnings, DO  CC: Corean Comment, NP

## 2024-02-01 ENCOUNTER — Ambulatory Visit (INDEPENDENT_AMBULATORY_CARE_PROVIDER_SITE_OTHER): Admitting: Neurology

## 2024-02-01 ENCOUNTER — Encounter: Payer: Self-pay | Admitting: Neurology

## 2024-02-01 ENCOUNTER — Other Ambulatory Visit (HOSPITAL_COMMUNITY): Payer: Self-pay

## 2024-02-01 ENCOUNTER — Telehealth: Payer: Self-pay | Admitting: Family

## 2024-02-01 ENCOUNTER — Telehealth: Payer: Self-pay | Admitting: Neurology

## 2024-02-01 VITALS — BP 123/74 | HR 79 | Ht 59.0 in | Wt 115.0 lb

## 2024-02-01 DIAGNOSIS — R292 Abnormal reflex: Secondary | ICD-10-CM

## 2024-02-01 DIAGNOSIS — R296 Repeated falls: Secondary | ICD-10-CM

## 2024-02-01 DIAGNOSIS — I671 Cerebral aneurysm, nonruptured: Secondary | ICD-10-CM

## 2024-02-01 DIAGNOSIS — G43009 Migraine without aura, not intractable, without status migrainosus: Secondary | ICD-10-CM

## 2024-02-01 MED ORDER — NORTRIPTYLINE HCL 10 MG PO CAPS
ORAL_CAPSULE | ORAL | 0 refills | Status: DC
  Filled 2024-02-01: qty 60, 37d supply, fill #0

## 2024-02-01 MED ORDER — NORTRIPTYLINE HCL 10 MG PO CAPS: ORAL_CAPSULE | ORAL | 0 refills | Status: DC

## 2024-02-01 NOTE — Addendum Note (Signed)
 Addended by: OZELL JESUSA PARAS on: 02/01/2024 01:54 PM   Modules accepted: Orders

## 2024-02-01 NOTE — Telephone Encounter (Signed)
Received, Placed on providers desk.

## 2024-02-01 NOTE — Telephone Encounter (Signed)
 Sent. It look like Dominique [name] is filling it not. She may have an issues pick up at [facility] if it goes through.    Tried calling patient no answer, unable to LVM.

## 2024-02-01 NOTE — Telephone Encounter (Signed)
 Pt called in today and  she stated she would like her prescriptions to go to [facility] on [address] [place]. Thanks

## 2024-02-01 NOTE — Telephone Encounter (Signed)
 [facility] specialties faxed standard written order, to be filled out by provider. Patient requested to send it back via Fax within ASAP. Document is located in providers tray at front office.Please advise at [phone].

## 2024-02-01 NOTE — Patient Instructions (Addendum)
 Restart nortriptyline  10mg  (1 pill) at bedtime for one week, then increase to 20mg  (2 pills) at bedtime.  Contact us  for refill and update. Check MRI of cervical spine with and without contrast Refer to Dr. Lester of interventional radiology.   Follow up 7 months.

## 2024-02-04 DIAGNOSIS — Z0279 Encounter for issue of other medical certificate: Secondary | ICD-10-CM

## 2024-02-10 ENCOUNTER — Other Ambulatory Visit: Payer: Self-pay

## 2024-02-10 ENCOUNTER — Other Ambulatory Visit (HOSPITAL_COMMUNITY): Payer: Self-pay

## 2024-02-10 NOTE — Progress Notes (Signed)
 Specialty Pharmacy Refill Coordination Note  Dominique [name] is a 48 y.o. female assessed today regarding refills of clinic administered specialty medication(s) Cabotegravir  (Apretude )   Clinic requested Courier to Provider Office   Delivery date: 02/21/24   Verified address: [address] [place] [address]   Medication will be filled on 02/18/24.

## 2024-02-18 ENCOUNTER — Other Ambulatory Visit: Payer: Self-pay

## 2024-02-21 ENCOUNTER — Telehealth: Payer: Self-pay

## 2024-02-21 NOTE — Telephone Encounter (Signed)
 [facility] Patient Advocate Encounter  Patient's medications Apretude  have been couriered to [facility] from [facility] and will be administered at the patients appointment on 02/28/24.  Arland Hutchinson, CPhT Specialty Pharmacy Patient [facility] for Infectious Disease Phone: [phone] Fax:  [phone]

## 2024-02-23 ENCOUNTER — Ambulatory Visit: Admitting: Neuroradiology

## 2024-02-25 NOTE — Progress Notes (Unsigned)
 HPI: Dominique [name] is a 48 y.o. female who presents to the [facility] pharmacy clinic for Apretude  administration and HIV PrEP follow up.  Referring ID Provider: Dr. 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   Brain aneurysm 07/28/2021   Bilateral ankle pain 05/20/2021   High risk medication use 04/21/2021   Herpes 01/23/2021   Polyarthralgia 01/23/2021   Neurosyphilis in adult 12/18/2020   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   Right hip pain 05/16/2020   Tobacco use disorder 07/31/2019   Dyspnea, paroxysmal nocturnal 07/31/2019   Schizophrenia (HCC) 07/31/2019   Abnormal uterine bleeding 05/09/2013   BV (bacterial vaginosis) 05/09/2013   Dysmenorrhea 05/09/2013    Patient's Medications  New Prescriptions   No medications on file  Previous Medications   CABOTEGRAVIR  ER (APRETUDE ) 600 MG/3ML INJECTION    Inject 3 mLs (600 mg total) into the muscle every 2 (two) months.   CETIRIZINE (ZYRTEC) 10 MG TABLET    Take 10 mg by mouth daily.   CITALOPRAM (CELEXA) 20 MG TABLET    Take 1 tablet by mouth daily.   DEPO-SUBQ PROVERA  104 104 MG/0.65ML INJECTION    every 3 (three) months.   DICLOFENAC  SODIUM (VOLTAREN ) 1 % GEL    Apply 2 g topically 4 (four) times daily.   FLUTICASONE (FLONASE) 50 MCG/ACT NASAL SPRAY    Place into the nose.   FOLIC ACID  (FOLVITE ) 1 MG TABLET    Take 1 tablet (1 mg total) by mouth daily.   HYDROXYCHLOROQUINE  (PLAQUENIL ) 200 MG TABLET    TAKE 1 TABLET BY MOUTH DAILY   MELATONIN (MELATONIN MAXIMUM STRENGTH) 5 MG TABS    Take by mouth.   MELOXICAM  (MOBIC ) 15 MG TABLET    Take 1 tablet (15 mg total) by mouth daily.   MISC. DEVICES (ROLLATOR ULTRA-LIGHT) MISC    Rollator/Rolling Walker with seat, use for balance and support with ambulation    NORTRIPTYLINE  (PAMELOR ) 10 MG CAPSULE    Take 1 capsule at bedtime for one week, then increase to 2 capsules at bedtime.   PREGABALIN (LYRICA) 100 MG CAPSULE    Take 100 mg by mouth 3 (three) times daily.   SULFASALAZINE  (AZULFIDINE ) 500 MG EC TABLET    TAKE 1 TABLET BY MOUTH TWICE DAILY   TINIDAZOLE (TINDAMAX) 500 MG TABLET    Take by mouth.   VALACYCLOVIR (VALTREX) 500 MG TABLET    Take 500 mg by mouth daily.   VITAMIN D , ERGOCALCIFEROL , (DRISDOL ) 1.25 MG (50000 UNIT) CAPS CAPSULE    Take 1 capsule (50,000 Units total) by mouth once a week.  Modified Medications   No medications on file  Discontinued Medications   No medications on file    Allergies: Allergies  Allergen Reactions   Other     seasonal    Labs: Lab Results  Component Value Date   HIV1RNAQUANT NOT DETECTED 01/06/2024   HIV1RNAQUANT NOT DETECTED 11/03/2023   HIV1RNAQUANT NOT DETECTED 09/01/2023    RPR and STI Lab Results  Component Value Date   LABRPR REACTIVE (A) 01/06/2024   LABRPR NON-REACTIVE 09/01/2023   LABRPR REACTIVE (A) 03/03/2023   LABRPR REACTIVE (A) 08/26/2022   LABRPR REACTIVE (A) 06/24/2022   RPRTITER 1:1 (H) 01/06/2024   RPRTITER 1:1 (H) 03/03/2023   RPRTITER 1:2 (H)  08/26/2022   RPRTITER 1:2 (H) 06/24/2022   RPRTITER 1:2 (H) 04/29/2022    STI Results GC GC CT CT  01/06/2024  2:43 PM Negative    Negative   Negative    Negative    09/01/2023  9:20 AM Negative    Negative   Negative    Negative    05/11/2023 11:01 AM Negative    Negative   Negative    Negative    03/03/2023  9:41 AM Negative    Negative   Negative    Negative    08/26/2022  9:14 AM Negative    Negative   Negative    Negative    06/24/2022 10:42 AM Negative    Negative   Negative    Negative    04/29/2022 10:00 AM Negative   Negative    10/31/2021  9:06 AM Negative   Negative    09/02/2021  9:56 AM Negative   Negative    06/25/2021  8:33 AM Negative   Negative    01/30/2021 10:14 AM Negative   Negative     10/16/2019  3:39 PM Negative   Negative    05/09/2013 10:57 AM  NEGATIVE   NEGATIVE   12/07/2011 12:20 PM   NEGATIVE      Hepatitis B Lab Results  Component Value Date   HEPBSAB REACTIVE (A) 12/31/2021   HEPBSAG NON-REACTIVE 12/18/2020   HEPBCAB NON-REACTIVE 12/18/2020   Hepatitis C Lab Results  Component Value Date   HEPCAB NON-REACTIVE 12/18/2020   Hepatitis A Lab Results  Component Value Date   HAV REACTIVE (A) 05/11/2023   Lipids: Lab Results  Component Value Date   CHOL 154 07/28/2019   TRIG 96 07/28/2019   HDL 57 07/28/2019   CHOLHDL 2.7 07/28/2019   LDLCALC 79 07/28/2019    Target Date: The 3rd  Assessment: Dominique [name] presents today for her Apretude  injection and to follow up for HIV PrEP. No issues with past injections. Denies any symptoms of acute HIV. Last HIV RNA was negative on 01/06/24.   Yeztugo??  Routine labs:  HIV RNA today; ***  Eligible vaccinations:  Currently up to date on all recommended vaccines.   Apretude : Administered cabotegravir  600mg /83mL in *** upper outer quadrant of the gluteal muscle. Will see her back in 2 months for next Apretude  injection, labs, and HIV PrEP follow up.  Plan: - Apretude  injection administered - HIV RNA today - Next injection, labs, and PrEP follow up appointment scheduled for *** - Call with any issues or questions  Neleh Muldoon L. Jamir Rone, PharmD, BCIDP, AAHIVP, CPP Clinical Pharmacist Practitioner - Infectious Diseases Clinical Pharmacist Lead - Specialty Pharmacy [facility] for Infectious Disease

## 2024-02-28 ENCOUNTER — Other Ambulatory Visit: Payer: Self-pay

## 2024-02-28 ENCOUNTER — Ambulatory Visit: Admitting: Pharmacist

## 2024-02-28 DIAGNOSIS — Z79899 Other long term (current) drug therapy: Secondary | ICD-10-CM

## 2024-02-28 DIAGNOSIS — Z113 Encounter for screening for infections with a predominantly sexual mode of transmission: Secondary | ICD-10-CM

## 2024-02-28 MED ORDER — CABOTEGRAVIR ER 600 MG/3ML IM SUER
600.0000 mg | Freq: Once | INTRAMUSCULAR | Status: AC
  Administered 2024-02-28: 600 mg via INTRAMUSCULAR

## 2024-03-01 ENCOUNTER — Encounter: Payer: Self-pay | Admitting: Neuroradiology

## 2024-03-01 ENCOUNTER — Ambulatory Visit: Admitting: Neuroradiology

## 2024-03-01 VITALS — BP 158/91 | HR 96 | Temp 98.3°F | Ht 59.0 in | Wt 119.2 lb

## 2024-03-01 DIAGNOSIS — Z95828 Presence of other vascular implants and grafts: Secondary | ICD-10-CM | POA: Diagnosis not present

## 2024-03-01 DIAGNOSIS — I671 Cerebral aneurysm, nonruptured: Secondary | ICD-10-CM | POA: Diagnosis not present

## 2024-03-01 LAB — HIV-1 RNA QUANT-NO REFLEX-BLD
HIV 1 RNA Quant: NOT DETECTED {copies}/mL
HIV-1 RNA Quant, Log: NOT DETECTED {Log_copies}/mL

## 2024-03-01 NOTE — Progress Notes (Signed)
 She had a left anterior communicating artery aneurysm treated with a web device by Dr. Dolphus on 07/28/2021.  I reviewed the angiogram from that surgery.  She had an MRA 09/24/2023 which I also reviewed.  The MRA shows complete treatment of the aneurysm.  There are no other aneurysms.  She is currently not taking any blood thinners.  I have explained that with 2-year follow-up and no recurrence of the aneurysm, further routine imaging is not necessary.  I also explained that there is no need for routine screening for brain aneurysms from this point forward.

## 2024-03-07 NOTE — Progress Notes (Deleted)
 Office Visit Note  Patient: Dominique [name]             Date of Birth: 1975/11/29           MRN: [id number]             PCP: Lucius Krabbe, NP Referring: Earvin Johnston PARAS, FNP Visit Date: 03/20/2024   Subjective:  No chief complaint on file.   History of Present Illness: Dominique [name] is a 48 y.o. 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    Previous HPI 12/10/2023 Dominique [name] is a 48 y.o. 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 She was originally scheduled to see in office but could not attend due to mobility limitations from her symptoms.   She experiences persistent balance issues and foot pain. Despite consulting an orthopedic doctor, she has not received a diagnosis for her balance problems and is scheduled to see a neurologist in November.   Her foot pain is persistent, particularly at the bottom of her feet, and has not been fully alleviated by an increased dosage of Lyrica prescribed by her pain management doctor. She is not taking nortriptyline  any more, as she has not yet seen the neurologist who prescribed it since running out of the medication.   She reports swelling in her feet and describes her toes as cramping and flexing, causing discomfort. Recent blood tests from a physical on December 06, 2023, did not specifically check for inflammatory disease markers but included a blood count and metabolic panel which were normal. No recent steroid use.         Previous HPI 09/09/2023 Dominique [name] is a 48 y.o. 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   She has been experiencing significant leg weakness and balance issues over the past month, with her balance deteriorating to the point where she can  stand for only a minute before her legs become too weak. She has fallen approximately three times a week, with the most recent fall occurring yesterday. She attributes her balance issues primarily to leg weakness rather than vertigo or dizziness.   She has a history of rheumatoid arthritis and was previously on a higher dose of sulfasalazine , which was decreased last year. She continues to experience joint problems, including redness and swelling, particularly around her knees. She is currently taking Lyrica for leg numbness.   She has been seeing an orthopedic specialist recently for her ankle issues.      Previous HPI 11/02/2022 Dominique [name] is a 48 y.o. 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  Since her last visit she has not had any major exacerbation of symptoms.  Most problematic joint still in bilateral ankles mostly pain with weightbearing.  Does not see a lot of associated swelling occasionally some that last less than 1 day duration.  Still with discolored skin rashes mostly on the face not itchy or painful.  She is noticing more bruising on her lower legs does not recall any preceding trauma to the sites.  No bruising on the upper part of her body.  She is scheduled for follow-up 55-month MRI for monitoring of aneurysm but with no new symptom  complaints.   Previous HPI 07/15/2022 Dominique [name] is a 48 y.o. 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  Since her last visit she saw physical therapy completed 1 session with them so far is scheduled to follow-up planning to do some pool or other aquatic therapy.  She switched to Lyrica with pain management so far finding this more effective than the gabapentin  for ongoing symptoms.  She had mildly low potassium checked in previous clinic visit was concerned if this contributed to muscle  cramping issues but was improved back to normal range upon recheck.  Still getting some stiffness and soreness most noticeably at the feet and ankles and some lasting through the day but no major exacerbation.  Not seeing much visible swelling or rashes.   Previous HPI 04/15/22 Dominique [name] is a 48 y.o. female here for follow up for seronegative RA (ANA+) on hydroxychloroquine  200 mg daily and sulfasalazine  1000 mg twice.  No major flareups since her last visit.  Hyperpigmented skin changes mostly around the face may be the same or slightly worse.  Still with persistent foot and ankle pain.   Previous HPI 12/10/21 Dominique [name] is a 48 y.o. 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  Most symptoms are stable main complaint today is bilateral foot and ankle pain and swelling. No obvious difference off of HCQ. Pain is throughout the day describes as stabbing with pins type of sensation. She is seeing swelling accumulate usually by the evening and worse when sitting or standing in fixed position for a long time.    Previous HPI 09/12/2021 Dominique [name] is a 48 y.o. 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 She feels symptoms partially improved with less knee pain but continues to have every day pain in bilateral feet and ankles. Pain is first thing in the morning as well as worsening if she is on her feet [name] day. Feet swelling is noticeable with prolonged standing worse later in the day. She had coil embolization procedure uneventfully. She has noticed some progression of facial rash.   Previous HPI 07/21/21 Dominique [name] is a 48 y.o. 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 So far she still has about the same amount of pain worst affected areas in both heels and in mid feet. Plantar fasciitis exercises mildly helpful  for the bottom of the feet but not much. 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    Previous HPI 05/20/21 Dominique [name] is a 48 y.o. 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 So far her symptoms are about the same. She continues having pain worst in her feet and less extent along medial side of knees. She does not see much swelling or discoloration just pain. Has not noticed any side effects of the methotrexate .   Previous HPI 01/23/21 Dominique [name] is a 48 y.o. female here for evaluation of positive ANA and elevated sedimentation rate with multiple systemic symptoms including weight loss, lymphadenopathy, paresthesias, joint pain, and headaches.  She started noticing hyperpigmented skin rashes developing on her face  and extremities since several months ago does not recall specific onset or provoking episode.  Then developed worsening joint pain in multiple sites but started around July of this year.  This involved pain affecting both hands also the hips knees and feet bilaterally.  She described morning stiffness lasting 2 or 3 hours in duration also especially pain in the bottoms of the feet first and getting out of bed.  After a month with these ongoing and somewhat worsening symptoms she was seen in primary care clinic. Workup at that time positive for trichomoniasis and syphilis and starting antibiotics also found to have high sedimentation rate with positive ANA and CCP Abs. She followed up for continued treatment last month and more recently in ID clinic with completion of antibiotic treatment for syphilis with Bicillin .  Neurology evaluation with lumbar puncture that was not suggestive for active neurosyphilis. She saw ophthalmology for visual change symptoms reports findings were not concerning for infectious or inflammatory type changes.   No Rheumatology ROS  completed.   PMFS History:  Patient Active Problem List   Diagnosis Date Noted   Anxiety and depression 01/27/2024   Iron deficiency anemia 12/24/2022   Subclinical hyperthyroidism 12/24/2022   Vitamin D  deficiency 01/09/2022   Seronegative rheumatoid arthritis (HCC) 12/13/2021   Brain aneurysm 07/28/2021   Bilateral ankle pain 05/20/2021   High risk medication use 04/21/2021   Herpes 01/23/2021   Polyarthralgia 01/23/2021   Neurosyphilis in adult 12/18/2020   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   Right hip pain 05/16/2020   Tobacco use disorder 07/31/2019   Dyspnea, paroxysmal nocturnal 07/31/2019   Schizophrenia (HCC) 07/31/2019   Abnormal uterine bleeding 05/09/2013   BV (bacterial vaginosis) 05/09/2013   Dysmenorrhea 05/09/2013    Past Medical History:  Diagnosis Date   Abnormal thyroid  blood test 11/21/2020   Abnormal weight loss 11/07/2020   Adjustment disorder with mixed anxiety and depressed mood 09/17/2022   Allergy    Anemia    Aneurysm    Anxiety    Arthritis    RA   Blurry vision, bilateral 12/18/2020   Bright red blood per rectum 10/18/2019   Cannabis abuse 02/04/2022   Chronic pain of both knees 01/09/2022   Elevated erythrocyte sedimentation rate 11/14/2020   Encounter for screening examination for sexually transmitted disease 07/31/2019   Erythema nodosum 11/07/2020   Family history of colorectal cancer 02/04/2022   Family history of diabetes mellitus 02/04/2022   Family history of lupus erythematosus 02/04/2022   Gastrointestinal hemorrhage 10/18/2019   Last Assessment & Plan: Formatting of this note might be different from the original. Resolved. Possibility of anal bleeding with positive trich if patient is having anal sex. The patient denies this. No anal fissures, hemorrhoids, or lesions were visualized with anoscope. With patient's history and her request will refer to gastroenterology today. Hgb 10.3 with of cell lines decreasing is likely    Headache  02/04/2022   Kidney stones    Lupus 01/17/2021   Lupus    Lupus (systemic lupus erythematosus) (HCC) 01/04/2020   Lymphadenopathy of head and neck 11/07/2020   Neuropathy 06/11/2022   Other visual disturbances 12/19/2019   Paresthesia of both lower extremities 11/25/2020   Paresthesia of hand, bilateral 11/25/2020   Positive antinuclear antibody 11/14/2020   Rash and other nonspecific skin eruption 01/23/2021   Schizoaffective disorder, bipolar type (HCC) 11/07/2020   Schizophrenia (HCC)    Seasonal allergic rhinitis due to pollen 01/09/2022   Seasonal allergies 11/07/2020   Severe episode  of recurrent major depressive disorder, 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/2022   Subclinical hyperthyroidism 07/04/2021   Systemic lupus erythematosus (HCC)    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   Vitamin D  deficiency 01/09/2022    Family History  Problem Relation Age of Onset   Stroke Mother    Arthritis Mother    Vision loss Mother    Multiple sclerosis Father    Hypertension Father    Diabetes Father    Breast cancer Maternal Aunt        unknown age   Dementia Maternal Grandfather    Asthma Brother    ADD / ADHD Maternal Grandmother    Asthma Son    Depression Daughter    Past Surgical History:  Procedure Laterality Date   BREAST BIOPSY Right 09/29/2022   US  RT BREAST BX W LOC DEV 1ST LESION IMG BX SPEC US  GUIDE 09/29/2022 [facility]   CESAREAN SECTION     HERNIA REPAIR     IR 3D INDEPENDENT WKST  07/28/2021   IR ANGIO INTRA EXTRACRAN SEL INTERNAL CAROTID BILAT MOD SED  07/28/2021   IR ANGIO VERTEBRAL SEL VERTEBRAL UNI L MOD SED  07/28/2021   IR ANGIOGRAM FOLLOW UP STUDY  07/28/2021   IR CT HEAD LTD  07/28/2021   IR NEURO EACH ADD'L AFTER BASIC UNI LEFT (MS)  07/28/2021   IR RADIOLOGIST EVAL & MGMT  07/10/2021   IR RADIOLOGIST EVAL & MGMT  08/15/2021   IR TRANSCATH/EMBOLIZ  07/28/2021   IR US  GUIDE VASC ACCESS RIGHT  07/28/2021   RADIOLOGY WITH  ANESTHESIA N/A 07/28/2021   Procedure: EMBOLIZATION;  Surgeon: Dolphus Carrion, MD;  Location: [facility] OR;  Service: Radiology;  Laterality: N/A;   TUBAL LIGATION  11/19/1999   Social History   Social History Narrative   Right handed   Immunization History  Administered Date(s) Administered   Hepatitis A, Adult 08/26/2022, 03/03/2023   Hepb-cpg 06/25/2021, 09/02/2021   Influenza, Seasonal, Injecte, Preservative Fre 12/31/2022, 01/06/2024   Influenza,inj,Quad PF,6+ Mos 01/30/2021, 12/31/2021   Tdap 02/25/2022     Objective: Vital Signs: There were no vitals taken for this visit.   Physical Exam   Musculoskeletal Exam: ***  CDAI Exam: CDAI Score: -- Patient Global: --; Provider Global: -- Swollen: --; Tender: -- Joint Exam 03/20/2024   No joint exam has been documented for this visit   There is currently no information documented on the homunculus. Go to the Rheumatology activity and complete the homunculus joint exam.  Investigation: No additional findings.  Imaging: No results found.  Recent Labs: Lab Results  Component Value Date   WBC 7.2 09/09/2023   HGB 11.2 (L) 09/09/2023   PLT 169 09/09/2023   NA 139 09/09/2023   K 3.3 (L) 09/09/2023   CL 103 09/09/2023   CO2 27 09/09/2023   GLUCOSE 112 (H) 09/09/2023   BUN 7 09/09/2023   CREATININE 0.71 09/09/2023   BILITOT 0.6 09/09/2023   ALKPHOS 58 12/23/2022   AST 19 09/09/2023   ALT 16 09/09/2023   PROT 7.1 09/09/2023   ALBUMIN 4.0 12/23/2022   CALCIUM 9.7 09/09/2023   GFRAA 117 05/20/2020    Speciality Comments: PLQ Eye Exam: 04/09/2022 [facility]  @[facility] f/u 3-4 weeks  Patient states she has an appointment with [facility] in September  Procedures:  No procedures performed Allergies: Other   Assessment / Plan:     Visit Diagnoses: No diagnosis found.  ***  Orders: No orders of the  defined types were placed in this encounter.  No orders of the defined types were placed in this  encounter.    Follow-Up Instructions: No follow-ups on file.   Kyomi Hector M Slyvester Latona, CMA  Note - This record has been created using Animal nutritionist.  Chart creation errors have been sought, but may not always  have been located. Such creation errors do not reflect on  the standard of medical care.

## 2024-03-09 ENCOUNTER — Other Ambulatory Visit: Payer: Self-pay | Admitting: Neurology

## 2024-03-12 ENCOUNTER — Inpatient Hospital Stay: Admission: RE | Admit: 2024-03-12 | Discharge: 2024-03-12 | Attending: Neurology

## 2024-03-12 DIAGNOSIS — R296 Repeated falls: Secondary | ICD-10-CM

## 2024-03-12 DIAGNOSIS — R292 Abnormal reflex: Secondary | ICD-10-CM

## 2024-03-12 MED ORDER — GADOPICLENOL 0.5 MMOL/ML IV SOLN
5.0000 mL | Freq: Once | INTRAVENOUS | Status: AC | PRN
  Administered 2024-03-12: 5 mL via INTRAVENOUS

## 2024-03-15 ENCOUNTER — Ambulatory Visit: Payer: Self-pay | Admitting: Neurology

## 2024-03-20 ENCOUNTER — Ambulatory Visit: Admitting: Internal Medicine

## 2024-03-20 DIAGNOSIS — Z79899 Other long term (current) drug therapy: Secondary | ICD-10-CM

## 2024-03-20 DIAGNOSIS — M0600 Rheumatoid arthritis without rheumatoid factor, unspecified site: Secondary | ICD-10-CM

## 2024-03-27 ENCOUNTER — Other Ambulatory Visit: Payer: Self-pay | Admitting: Internal Medicine

## 2024-03-27 DIAGNOSIS — M2550 Pain in unspecified joint: Secondary | ICD-10-CM

## 2024-03-27 DIAGNOSIS — R21 Rash and other nonspecific skin eruption: Secondary | ICD-10-CM

## 2024-03-27 DIAGNOSIS — R7689 Other specified abnormal immunological findings in serum: Secondary | ICD-10-CM

## 2024-03-27 NOTE — Progress Notes (Deleted)
 "  Office Visit Note  Patient: Dominique [name]             Date of Birth: Dec 25, 1975           MRN: [id number]             Visit Date: 04/05/2024  PCP: Lucius Krabbe, NP   Subjective:   Chief Complaint: No chief complaint on file.   History of Present Illness: Dominique [name] is a 48 y.o. female with PMH of *** who returns today for follow up of ***. She is feeling ***. She is taking hydroxychloroquine  200 mg daily and sulfasalazine  500 mg twice daily. She is tolerating medications without any side effects. She finished a 12 day prednisone  taper in ***   Plaquenil  eye exam ***  Otherwise, patient has *** had any other significant health changes since last visit. 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 Her HIV RNA was undetectable in December.   Previous Medication Trials: methotrexate   Last Labs: 12/01/2023- CMP/CBC WNL, 09/09/2023 Sed Rate Normal  Review of Systems: No Rheumatology ROS completed.    Objective: Vital Signs: There were no vitals taken for this visit.  Physical Exam Vitals reviewed.  Constitutional:      General: She is not in acute distress.    Appearance: Normal appearance. She is well-developed and normal weight.  HENT:     Head: Normocephalic and atraumatic. No right periorbital erythema or left periorbital erythema.     Mouth/Throat:     Mouth: Mucous membranes are moist.     Pharynx: Oropharynx is clear.  Eyes:     General: Lids are normal.        Right eye: No discharge.        Left eye: No discharge.     Extraocular Movements: Extraocular movements intact.     Conjunctiva/sclera: Conjunctivae normal.     Pupils: Pupils are equal, round, and reactive to light.  Cardiovascular:     Rate and Rhythm: Normal rate and regular rhythm.     Heart sounds: Normal heart sounds. No murmur heard.    No friction rub. No gallop.  Pulmonary:     Effort: Pulmonary effort is normal. No respiratory distress.     Breath sounds:  Normal breath sounds. No stridor. No wheezing, rhonchi or rales.  Chest:     Chest wall: No deformity.  Musculoskeletal:     Right lower leg: No edema.     Left lower leg: No edema.     Comments: There is *** tenderness in ***. There is *** synovitis. There is *** swelling, warmth, crepitus, or erythema in ***. No malalignment, asymmetry, dislocation, subluxation, or laxity. Normal gait. Normal tone. Muscle strength 5/5. No atrophy.  Lymphadenopathy:     Cervical: No cervical adenopathy.  Skin:    General: Skin is warm and moist.     Capillary Refill: Capillary refill takes less than 2 seconds.     Findings: No rash.  Neurological:     Mental Status: She is alert.     Gait: Gait normal.  Psychiatric:        Mood and Affect: Mood normal.        Behavior: Behavior normal.     Imaging: No results found.  Problem List:  Patient Active Problem List   Diagnosis Date Noted   Anxiety and depression 01/27/2024   Iron deficiency anemia 12/24/2022   Subclinical hyperthyroidism 12/24/2022   Vitamin D  deficiency 01/09/2022  Seronegative rheumatoid arthritis (HCC) 12/13/2021   Brain aneurysm 07/28/2021   Bilateral ankle pain 05/20/2021   High risk medication use 04/21/2021   Herpes 01/23/2021   Polyarthralgia 01/23/2021   Neurosyphilis in adult 12/18/2020   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   Right hip pain 05/16/2020   Tobacco use disorder 07/31/2019   Dyspnea, paroxysmal nocturnal 07/31/2019   Schizophrenia (HCC) 07/31/2019   Abnormal uterine bleeding 05/09/2013   BV (bacterial vaginosis) 05/09/2013   Dysmenorrhea 05/09/2013    Medication List: Current Medications[1]   PMFS History:  History: Past Medical History:  Diagnosis Date   Abnormal thyroid  blood test 11/21/2020   Abnormal weight loss 11/07/2020   Adjustment disorder with mixed anxiety and depressed mood 09/17/2022   Allergy    Anemia    Aneurysm    Anxiety    Arthritis    RA   Blurry vision, bilateral  12/18/2020   Bright red blood per rectum 10/18/2019   Cannabis abuse 02/04/2022   Chronic pain of both knees 01/09/2022   Elevated erythrocyte sedimentation rate 11/14/2020   Encounter for screening examination for sexually transmitted disease 07/31/2019   Erythema nodosum 11/07/2020   Family history of colorectal cancer 02/04/2022   Family history of diabetes mellitus 02/04/2022   Family history of lupus erythematosus 02/04/2022   Gastrointestinal hemorrhage 10/18/2019   Last Assessment & Plan: Formatting of this note might be different from the original. Resolved. Possibility of anal bleeding with positive trich if patient is having anal sex. The patient denies this. No anal fissures, hemorrhoids, or lesions were visualized with anoscope. With patient's history and her request will refer to gastroenterology today. Hgb 10.3 with of cell lines decreasing is likely    Headache 02/04/2022   Kidney stones    Lupus 01/17/2021   Lupus    Lupus (systemic lupus erythematosus) (HCC) 01/04/2020   Lymphadenopathy of head and neck 11/07/2020   Neuropathy 06/11/2022   Other visual disturbances 12/19/2019   Paresthesia of both lower extremities 11/25/2020   Paresthesia of hand, bilateral 11/25/2020   Positive antinuclear antibody 11/14/2020   Rash and other nonspecific skin eruption 01/23/2021   Schizoaffective disorder, bipolar type (HCC) 11/07/2020   Schizophrenia (HCC)    Seasonal allergic rhinitis due to pollen 01/09/2022   Seasonal allergies 11/07/2020   Severe episode of recurrent major depressive disorder, 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/2022   Subclinical hyperthyroidism 07/04/2021   Systemic lupus erythematosus (HCC)    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   Vitamin D  deficiency 01/09/2022    Family History  Problem Relation Age of Onset   Stroke Mother    Arthritis Mother    Vision loss Mother    Multiple sclerosis Father    Hypertension Father     Diabetes Father    Breast cancer Maternal Aunt        unknown age   Dementia Maternal Grandfather    Asthma Brother    ADD / ADHD Maternal Grandmother    Asthma Son    Depression Daughter    Past Surgical History:  Procedure Laterality Date   BREAST BIOPSY Right 09/29/2022   US  RT BREAST BX W LOC DEV 1ST LESION IMG BX SPEC US  GUIDE 09/29/2022 [facility] MAMMO[facility]AP[facility]   CESAREAN SECTION     HERNIA REPAIR     IR 3D INDEPENDENT WKST  07/28/2021   IR ANGIO INTRA EXTRACRAN SEL INTERNAL CAROTID BILAT MOD SED  07/28/2021   IR ANGIO VERTEBRAL SEL VERTEBRAL UNI L MOD SED  07/28/2021  IR ANGIOGRAM FOLLOW UP STUDY  07/28/2021   IR CT HEAD LTD  07/28/2021   IR NEURO EACH ADD'L AFTER BASIC UNI LEFT (MS)  07/28/2021   IR RADIOLOGIST EVAL & MGMT  07/10/2021   IR RADIOLOGIST EVAL & MGMT  08/15/2021   IR TRANSCATH/EMBOLIZ  07/28/2021   IR US  GUIDE VASC ACCESS RIGHT  07/28/2021   RADIOLOGY WITH ANESTHESIA N/A 07/28/2021   Procedure: EMBOLIZATION;  Surgeon: Dolphus Carrion, MD;  Location: [facility];  Service: Radiology;  Laterality: N/A;   TUBAL LIGATION  11/19/1999   Social History   Social History Narrative   Right handed   Allergies: Other  Immunization status:  Immunization History  Administered Date(s) Administered   Hepatitis A, Adult 08/26/2022, 03/03/2023   Hepb-cpg 06/25/2021, 09/02/2021   Influenza, Seasonal, Injecte, Preservative Fre 12/31/2022, 01/06/2024   Influenza,inj,Quad PF,6+ Mos 01/30/2021, 12/31/2021   Tdap 02/25/2022     Assessment:  Visit Diagnoses: Seronegative rheumatoid arthritis (HCC)  High risk medication use  Seronegative rheumatoid arthritis (HCC) Currently managed with plaquenil  and sulfasalazine . Most recent labs normal but will need to recheck again today.  -Continue plaquenil  200 mg every day and sulfasalazine  500 mg twice daily.   High risk medication use High risk prescription-This patient is on drug therapy requiring intensive monitoring for toxicity,  including regular lab monitoring and screening for serious or recurrent infections. Today, I assessed for side effects, infections, new or worsening health conditions that may be related to the medications, and {Blank multiple:19196::will obtain labs,reviewed and interpreted CBC and CMP obtained by outside provider}.   Follow-Up Instructions:  No follow-ups on file.   Orders: No orders of the defined types were placed in this encounter.   No orders of the defined types were placed in this encounter.     Procedures: No procedures performed  Daved GORMAN Holstein, PA-C  Note - This record has been created using [employer]. Chart creation errors have been sought, but may not always have been located. Such creation errors do not reflect on the standard of medical care.      [1]  Current Outpatient Medications:    cabotegravir  ER (APRETUDE ) 600 MG/3ML injection, Inject 3 mLs (600 mg total) into the muscle every 2 (two) months., Disp: 3 mL, Rfl: 5   cetirizine (ZYRTEC) 10 MG tablet, Take 10 mg by mouth daily., Disp: , Rfl:    citalopram (CELEXA) 20 MG tablet, Take 1 tablet by mouth daily., Disp: , Rfl:    DEPO-SUBQ PROVERA  104 104 MG/0.65ML injection, every 3 (three) months., Disp: , Rfl:    diclofenac  Sodium (VOLTAREN ) 1 % GEL, Apply 2 g topically 4 (four) times daily., Disp: 150 g, Rfl: 1   fluticasone (FLONASE) 50 MCG/ACT nasal spray, Place into the nose. (Patient taking differently: Place into the nose as needed.), Disp: , Rfl:    folic acid  (FOLVITE ) 1 MG tablet, Take 1 tablet (1 mg total) by mouth daily., Disp: 90 tablet, Rfl: 3   hydroxychloroquine  (PLAQUENIL ) 200 MG tablet, TAKE 1 TABLET BY MOUTH DAILY, Disp: 30 tablet, Rfl: 0   meloxicam  (MOBIC ) 15 MG tablet, Take 1 tablet (15 mg total) by mouth daily., Disp: 30 tablet, Rfl: 0   Misc. Devices (ROLLATOR ULTRA-LIGHT) MISC, Rollator/Rolling Walker with seat, use for balance and support with ambulation, Disp: , Rfl:     nortriptyline  (PAMELOR ) 10 MG capsule, 2 CAPSULES AT BEDTIME, Disp: 60 capsule, Rfl: 5   pregabalin (LYRICA) 100 MG capsule, Take 100 mg by mouth 3 (  three) times daily., Disp: , Rfl:    sulfaSALAzine  (AZULFIDINE ) 500 MG EC tablet, TAKE 1 TABLET BY MOUTH TWICE DAILY, Disp: 60 tablet, Rfl: 0   tinidazole (TINDAMAX) 500 MG tablet, Take by mouth., Disp: , Rfl:    tiZANidine (ZANAFLEX) 4 MG tablet, Take 4 mg by mouth 2 (two) times daily., Disp: , Rfl:    valACYclovir (VALTREX) 500 MG tablet, Take 500 mg by mouth daily., Disp: , Rfl:    Vitamin D , Ergocalciferol , (DRISDOL ) 1.25 MG (50000 UNIT) CAPS capsule, Take 1 capsule (50,000 Units total) by mouth once a week., Disp: 12 capsule, Rfl: 3  "

## 2024-03-28 ENCOUNTER — Ambulatory Visit (INDEPENDENT_AMBULATORY_CARE_PROVIDER_SITE_OTHER)

## 2024-03-28 DIAGNOSIS — N939 Abnormal uterine and vaginal bleeding, unspecified: Secondary | ICD-10-CM

## 2024-03-28 DIAGNOSIS — N946 Dysmenorrhea, unspecified: Secondary | ICD-10-CM

## 2024-03-28 MED ORDER — MEDROXYPROGESTERONE ACETATE 150 MG/ML IM SUSP
150.0000 mg | Freq: Once | INTRAMUSCULAR | Status: AC
  Administered 2024-03-28: 150 mg via INTRAMUSCULAR

## 2024-03-28 NOTE — Progress Notes (Signed)
 Patient is in office today for a nurse visit for Birth Control Injection. Patient left ventrogluteal. Patient tolerated well.

## 2024-03-28 NOTE — Telephone Encounter (Signed)
 Last Fill: 12/30/2023  Eye exam: 04/09/2022 WNL    Labs: 12/01/2023 RBC 3.49, MCV 102, BUN 5, BUN/Creat. Ratio 7, Potassium 3.4   Next Visit: 04/05/2024  Last Visit: 12/10/2023  IK:Dzmnwzhjupcz rheumatoid arthritis   Current Dose per office note 12/10/2023: hydroxychloroquine  200 mg daily and sulfasalazine  500 mg EC twice daily   Reached out to patient and she states she has her PLQ eye exam scheduled for 04/03/2024.   Okay to refill Plaquenil ?

## 2024-04-05 ENCOUNTER — Ambulatory Visit

## 2024-04-05 DIAGNOSIS — M0600 Rheumatoid arthritis without rheumatoid factor, unspecified site: Secondary | ICD-10-CM

## 2024-04-05 DIAGNOSIS — Z79899 Other long term (current) drug therapy: Secondary | ICD-10-CM

## 2024-04-10 NOTE — Assessment & Plan Note (Signed)
 This patient is on drug therapy requiring intensive monitoring for toxicity, including regular lab monitoring and screening for serious or recurrent infections. Today, I assessed for side effects, infections, new or worsening health conditions that may be related to the medications, and will recheck labs today.

## 2024-04-10 NOTE — Progress Notes (Unsigned)
 "  Office Visit Note  Patient: Dominique [name]             Date of Birth: 07-Apr-1975           MRN: [id number]             Visit Date: 04/11/2024  PCP: Lucius Krabbe, NP   Subjective:   Chief Complaint: No chief complaint on file.  History of Present Illness: Dominique [name] is a 49 y.o. female with PMH of *** who returns today for follow up of ***. She is feeling ***. She is taking hydroxychloroquine  200 mg daily and sulfasalazine  500 mg twice daily. She is tolerating medications without any side effects. She finished a 12 day prednisone  taper in ***    Plaquenil  eye exam ***   Otherwise, patient has *** had any other significant health changes since last visit. 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 Her HIV RNA was undetectable in December.    Previous Medication Trials: methotrexate    Last Labs: 12/01/2023- CMP/CBC WNL, 09/09/2023 Sed Rate Normal  Review of Systems: No Rheumatology ROS completed.   Medication List: Current Medications[1]   Allergies:  Other  Immunization status:  Immunization History  Administered Date(s) Administered   Hepatitis A, Adult 08/26/2022, 03/03/2023   Hepb-cpg 06/25/2021, 09/02/2021   Influenza, Seasonal, Injecte, Preservative Fre 12/31/2022, 01/06/2024   Influenza,inj,Quad PF,6+ Mos 01/30/2021, 12/31/2021   Tdap 02/25/2022     Problem List:  Patient Active Problem List   Diagnosis Date Noted   Anxiety and depression 01/27/2024   Iron deficiency anemia 12/24/2022   Subclinical hyperthyroidism 12/24/2022   Vitamin D  deficiency 01/09/2022   Seronegative rheumatoid arthritis (HCC) 12/13/2021   Brain aneurysm 07/28/2021   Bilateral ankle pain 05/20/2021   High risk medication use 04/21/2021   Herpes 01/23/2021   Polyarthralgia 01/23/2021   Neurosyphilis in adult 12/18/2020   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   Right hip pain 05/16/2020   Tobacco use disorder 07/31/2019   Dyspnea, paroxysmal nocturnal  07/31/2019   Schizophrenia (HCC) 07/31/2019   Abnormal uterine bleeding 05/09/2013   BV (bacterial vaginosis) 05/09/2013   Dysmenorrhea 05/09/2013    History: Past Medical History:  Diagnosis Date   Abnormal thyroid  blood test 11/21/2020   Abnormal weight loss 11/07/2020   Adjustment disorder with mixed anxiety and depressed mood 09/17/2022   Allergy    Anemia    Aneurysm    Anxiety    Arthritis    RA   Blurry vision, bilateral 12/18/2020   Bright red blood per rectum 10/18/2019   Cannabis abuse 02/04/2022   Chronic pain of both knees 01/09/2022   Elevated erythrocyte sedimentation rate 11/14/2020   Encounter for screening examination for sexually transmitted disease 07/31/2019   Erythema nodosum 11/07/2020   Family history of colorectal cancer 02/04/2022   Family history of diabetes mellitus 02/04/2022   Family history of lupus erythematosus 02/04/2022   Gastrointestinal hemorrhage 10/18/2019   Last Assessment & Plan: Formatting of this note might be different from the original. Resolved. Possibility of anal bleeding with positive trich if patient is having anal sex. The patient denies this. No anal fissures, hemorrhoids, or lesions were visualized with anoscope. With patient's history and her request will refer to gastroenterology today. Hgb 10.3 with of cell lines decreasing is likely    Headache 02/04/2022   Kidney stones    Lupus 01/17/2021   Lupus    Lupus (systemic lupus erythematosus) (HCC) 01/04/2020   Lymphadenopathy of  head and neck 11/07/2020   Neuropathy 06/11/2022   Other visual disturbances 12/19/2019   Paresthesia of both lower extremities 11/25/2020   Paresthesia of hand, bilateral 11/25/2020   Positive antinuclear antibody 11/14/2020   Rash and other nonspecific skin eruption 01/23/2021   Schizoaffective disorder, bipolar type (HCC) 11/07/2020   Schizophrenia (HCC)    Seasonal allergic rhinitis due to pollen 01/09/2022   Seasonal allergies 11/07/2020    Severe episode of recurrent major depressive disorder, 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/2022   Subclinical hyperthyroidism 07/04/2021   Systemic lupus erythematosus (HCC)    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   Vitamin D  deficiency 01/09/2022    Family History  Problem Relation Age of Onset   Stroke Mother    Arthritis Mother    Vision loss Mother    Multiple sclerosis Father    Hypertension Father    Diabetes Father    Breast cancer Maternal Aunt        unknown age   Dementia Maternal Grandfather    Asthma Brother    ADD / ADHD Maternal Grandmother    Asthma Son    Depression Daughter    Past Surgical History:  Procedure Laterality Date   BREAST BIOPSY Right 09/29/2022   US  RT BREAST BX W LOC DEV 1ST LESION IMG BX SPEC US  GUIDE 09/29/2022 [facility] MAMMOGRAPHY   CESAREAN SECTION     HERNIA REPAIR     IR 3D INDEPENDENT WKST  07/28/2021   IR ANGIO INTRA EXTRACRAN SEL INTERNAL CAROTID BILAT MOD SED  07/28/2021   IR ANGIO VERTEBRAL SEL VERTEBRAL UNI L MOD SED  07/28/2021   IR ANGIOGRAM FOLLOW UP STUDY  07/28/2021   IR CT HEAD LTD  07/28/2021   IR NEURO EACH ADD'L AFTER BASIC UNI LEFT (MS)  07/28/2021   IR RADIOLOGIST EVAL & MGMT  07/10/2021   IR RADIOLOGIST EVAL & MGMT  08/15/2021   IR TRANSCATH/EMBOLIZ  07/28/2021   IR US  GUIDE VASC ACCESS RIGHT  07/28/2021   RADIOLOGY WITH ANESTHESIA N/A 07/28/2021   Procedure: EMBOLIZATION;  Surgeon: Dolphus Carrion, MD;  Location: [facility];  Service: Radiology;  Laterality: N/A;   TUBAL LIGATION  11/19/1999   Social History   Social History Narrative   Right handed    Objective: Vital Signs: There were no vitals taken for this visit.  Physical Exam Vitals reviewed.  Constitutional:      General: She is not in acute distress.    Appearance: Normal appearance. She is well-developed and normal weight.  HENT:     Head: Normocephalic and atraumatic. No right periorbital erythema or left periorbital erythema.      Mouth/Throat:     Mouth: Mucous membranes are moist.     Pharynx: Oropharynx is clear.  Eyes:     General: Lids are normal.        Right eye: No discharge.        Left eye: No discharge.     Extraocular Movements: Extraocular movements intact.     Conjunctiva/sclera: Conjunctivae normal.     Pupils: Pupils are equal, round, and reactive to light.  Cardiovascular:     Rate and Rhythm: Normal rate and regular rhythm.     Heart sounds: Normal heart sounds. No murmur heard.    No friction rub. No gallop.  Pulmonary:     Effort: Pulmonary effort is normal. No respiratory distress.     Breath sounds: Normal breath sounds. No stridor. No wheezing, rhonchi or rales.  Chest:     Chest wall: No deformity.  Musculoskeletal:     Right lower leg: No edema.     Left lower leg: No edema.     Comments: Synovitis present in ***. Tenderness noted in ***.  C-spine, thoracic spine, lumbar spine have *** range of motion. *** SI joint tenderness. Shoulder joints, elbow joints, wrist joints, MCPs, PIPs, DIPs have *** range of motion. *** fist formation bilaterally.  Hip joints have good range of motion with *** pain with internal and external rotation.  Knee joints have *** range of motion with no warmth, effusion, or crepitus. Ankle joints have *** range of motion with no tenderness or joint swelling. *** evidence of Achilles tendinitis or plantar fasciitis. *** MTP squeeze.   Lymphadenopathy:     Cervical: No cervical adenopathy.  Skin:    General: Skin is warm and moist.     Capillary Refill: Capillary refill takes less than 2 seconds.     Findings: No rash.  Neurological:     Mental Status: She is alert.     Gait: Gait normal.  Psychiatric:        Mood and Affect: Mood normal.        Behavior: Behavior normal.     CDAI Score: Considering all the ways arthritis affects you, how well are you doing? 0 is very good, 10 is very bad.  CDAI Score: -- Patient Global: --; Provider Global:  -- Swollen: --; Tender: -- Joint Exam 04/11/2024   No joint exam has been documented for this visit   There is currently no information documented on the homunculus. Go to the Rheumatology activity and complete the homunculus joint exam.  Imaging: No results found.   Assessment & Plan Seronegative rheumatoid arthritis (HCC) Currently managed with plaquenil  and sulfasalazine . Most recent labs normal but will need to recheck again today.  -Continue plaquenil  200 mg every day and sulfasalazine  500 mg twice daily.     High risk medication use This patient is on drug therapy requiring intensive monitoring for toxicity, including regular lab monitoring and screening for serious or recurrent infections. Today, I assessed for side effects, infections, new or worsening health conditions that may be related to the medications, and will recheck labs today.      Follow-Up Instructions:  No follow-ups on file.    Procedures: No procedures performed  Daved GORMAN Holstein, PA-C  Note - This record has been created using [employer]. Chart creation errors have been sought, but may not always have been located. Such creation errors do not reflect on the standard of medical care.     [1]  Current Outpatient Medications:    cabotegravir  ER (APRETUDE ) 600 MG/3ML injection, Inject 3 mLs (600 mg total) into the muscle every 2 (two) months., Disp: 3 mL, Rfl: 5   cetirizine (ZYRTEC) 10 MG tablet, Take 10 mg by mouth daily., Disp: , Rfl:    citalopram (CELEXA) 20 MG tablet, Take 1 tablet by mouth daily., Disp: , Rfl:    DEPO-SUBQ PROVERA  104 104 MG/0.65ML injection, every 3 (three) months., Disp: , Rfl:    diclofenac  Sodium (VOLTAREN ) 1 % GEL, Apply 2 g topically 4 (four) times daily., Disp: 150 g, Rfl: 1   fluticasone (FLONASE) 50 MCG/ACT nasal spray, Place into the nose. (Patient taking differently: Place into the nose as needed.), Disp: , Rfl:    folic acid  (FOLVITE ) 1 MG tablet, Take 1 tablet (1  mg total) by mouth daily., Disp: 90  tablet, Rfl: 3   hydroxychloroquine  (PLAQUENIL ) 200 MG tablet, TAKE 1 TABLET BY MOUTH DAILY, Disp: 30 tablet, Rfl: 0   meloxicam  (MOBIC ) 15 MG tablet, Take 1 tablet (15 mg total) by mouth daily., Disp: 30 tablet, Rfl: 0   Misc. Devices (ROLLATOR ULTRA-LIGHT) MISC, Rollator/Rolling Walker with seat, use for balance and support with ambulation, Disp: , Rfl:    nortriptyline  (PAMELOR ) 10 MG capsule, 2 CAPSULES AT BEDTIME, Disp: 60 capsule, Rfl: 5   pregabalin (LYRICA) 100 MG capsule, Take 100 mg by mouth 3 (three) times daily., Disp: , Rfl:    sulfaSALAzine  (AZULFIDINE ) 500 MG EC tablet, TAKE 1 TABLET BY MOUTH TWICE DAILY, Disp: 60 tablet, Rfl: 0   tinidazole (TINDAMAX) 500 MG tablet, Take by mouth., Disp: , Rfl:    tiZANidine (ZANAFLEX) 4 MG tablet, Take 4 mg by mouth 2 (two) times daily., Disp: , Rfl:    valACYclovir (VALTREX) 500 MG tablet, Take 500 mg by mouth daily., Disp: , Rfl:    Vitamin D , Ergocalciferol , (DRISDOL ) 1.25 MG (50000 UNIT) CAPS capsule, Take 1 capsule (50,000 Units total) by mouth once a week., Disp: 12 capsule, Rfl: 3  "

## 2024-04-10 NOTE — Assessment & Plan Note (Signed)
 Currently managed with plaquenil  and sulfasalazine . Most recent labs normal but will need to recheck again today.  -Continue plaquenil  200 mg every day and sulfasalazine  500 mg twice daily.

## 2024-04-11 ENCOUNTER — Ambulatory Visit

## 2024-04-11 DIAGNOSIS — M0600 Rheumatoid arthritis without rheumatoid factor, unspecified site: Secondary | ICD-10-CM

## 2024-04-11 DIAGNOSIS — Z79899 Other long term (current) drug therapy: Secondary | ICD-10-CM

## 2024-04-24 ENCOUNTER — Other Ambulatory Visit: Payer: Self-pay

## 2024-04-24 ENCOUNTER — Other Ambulatory Visit (HOSPITAL_COMMUNITY): Payer: Self-pay

## 2024-04-24 NOTE — Progress Notes (Signed)
 Specialty Pharmacy Refill Coordination Note  Dominique [name] is a 49 y.o. female assessed today regarding refills of clinic administered specialty medication(s) Cabotegravir  (Apretude )   Clinic requested Courier to Provider Office   Delivery date: 04/27/24   Verified address: [address] [place] [address]   Medication will be filled on 04/26/24.

## 2024-04-25 ENCOUNTER — Ambulatory Visit (HOSPITAL_COMMUNITY): Payer: Self-pay | Admitting: Family

## 2024-04-26 ENCOUNTER — Other Ambulatory Visit: Payer: Self-pay

## 2024-04-27 ENCOUNTER — Telehealth: Payer: Self-pay

## 2024-04-27 NOTE — Telephone Encounter (Signed)
 [facility] Patient Advocate Encounter  Patient's medications APRETUDE  have been couriered to [facility] from [facility] and will be administered at the patients appointment on 05/01/24.  Charmaine Sharps, CPhT Specialty Pharmacy [facility] Phone: [phone] Fax:  [phone]

## 2024-05-01 ENCOUNTER — Ambulatory Visit: Admitting: Pharmacist

## 2024-05-02 ENCOUNTER — Other Ambulatory Visit: Payer: Self-pay

## 2024-05-02 ENCOUNTER — Ambulatory Visit

## 2024-05-02 ENCOUNTER — Encounter (HOSPITAL_BASED_OUTPATIENT_CLINIC_OR_DEPARTMENT_OTHER): Admitting: Radiology

## 2024-05-02 VITALS — Ht 60.0 in | Wt 119.0 lb

## 2024-05-02 DIAGNOSIS — Z0000 Encounter for general adult medical examination without abnormal findings: Secondary | ICD-10-CM | POA: Diagnosis not present

## 2024-05-02 DIAGNOSIS — Z1231 Encounter for screening mammogram for malignant neoplasm of breast: Secondary | ICD-10-CM

## 2024-05-05 ENCOUNTER — Ambulatory Visit: Admitting: Pharmacist

## 2024-05-05 DIAGNOSIS — Z79899 Other long term (current) drug therapy: Secondary | ICD-10-CM

## 2024-05-09 ENCOUNTER — Ambulatory Visit

## 2024-05-09 ENCOUNTER — Ambulatory Visit: Admitting: Pharmacist

## 2024-05-09 DIAGNOSIS — M2550 Pain in unspecified joint: Secondary | ICD-10-CM

## 2024-05-09 DIAGNOSIS — R7689 Other specified abnormal immunological findings in serum: Secondary | ICD-10-CM

## 2024-05-09 DIAGNOSIS — R21 Rash and other nonspecific skin eruption: Secondary | ICD-10-CM

## 2024-06-26 ENCOUNTER — Ambulatory Visit (HOSPITAL_COMMUNITY): Admitting: Family

## 2024-07-27 ENCOUNTER — Ambulatory Visit: Admitting: Family

## 2024-09-01 ENCOUNTER — Ambulatory Visit: Admitting: Neurology

## 2025-05-07 ENCOUNTER — Ambulatory Visit
